# Patient Record
Sex: Male | Born: 1980 | Race: White | Hispanic: No | Marital: Married | Smoking: Never smoker
Health system: Southern US, Community
[De-identification: ages and names within clinical notes are randomized; demographics above are authoritative.]

## PROBLEM LIST (undated history)

## (undated) DIAGNOSIS — F988 Other specified behavioral and emotional disorders with onset usually occurring in childhood and adolescence: Secondary | ICD-10-CM

## (undated) DIAGNOSIS — M51369 Other intervertebral disc degeneration, lumbar region without mention of lumbar back pain or lower extremity pain: Secondary | ICD-10-CM

## (undated) DIAGNOSIS — M549 Dorsalgia, unspecified: Secondary | ICD-10-CM

## (undated) DIAGNOSIS — G4730 Sleep apnea, unspecified: Secondary | ICD-10-CM

## (undated) DIAGNOSIS — M7052 Other bursitis of knee, left knee: Secondary | ICD-10-CM

## (undated) DIAGNOSIS — F329 Major depressive disorder, single episode, unspecified: Secondary | ICD-10-CM

## (undated) DIAGNOSIS — F32A Depression, unspecified: Secondary | ICD-10-CM

## (undated) DIAGNOSIS — H0259 Other disorders affecting eyelid function: Secondary | ICD-10-CM

## (undated) DIAGNOSIS — E669 Obesity, unspecified: Secondary | ICD-10-CM

## (undated) DIAGNOSIS — F419 Anxiety disorder, unspecified: Secondary | ICD-10-CM

## (undated) DIAGNOSIS — R0602 Shortness of breath: Secondary | ICD-10-CM

## (undated) DIAGNOSIS — K760 Fatty (change of) liver, not elsewhere classified: Secondary | ICD-10-CM

## (undated) DIAGNOSIS — F1010 Alcohol abuse, uncomplicated: Secondary | ICD-10-CM

## (undated) DIAGNOSIS — M7051 Other bursitis of knee, right knee: Secondary | ICD-10-CM

## (undated) DIAGNOSIS — K219 Gastro-esophageal reflux disease without esophagitis: Secondary | ICD-10-CM

## (undated) DIAGNOSIS — I10 Essential (primary) hypertension: Secondary | ICD-10-CM

## (undated) DIAGNOSIS — E785 Hyperlipidemia, unspecified: Secondary | ICD-10-CM

## (undated) DIAGNOSIS — M5136 Other intervertebral disc degeneration, lumbar region: Secondary | ICD-10-CM

## (undated) DIAGNOSIS — M722 Plantar fascial fibromatosis: Secondary | ICD-10-CM

## (undated) DIAGNOSIS — F1990 Other psychoactive substance use, unspecified, uncomplicated: Secondary | ICD-10-CM

## (undated) HISTORY — DX: Depression, unspecified: F32.A

## (undated) HISTORY — DX: Obesity, unspecified: E66.9

## (undated) HISTORY — DX: Other bursitis of knee, left knee: M70.51

## (undated) HISTORY — DX: Essential (primary) hypertension: I10

## (undated) HISTORY — DX: Shortness of breath: R06.02

## (undated) HISTORY — DX: Dorsalgia, unspecified: M54.9

## (undated) HISTORY — DX: Sleep apnea, unspecified: G47.30

## (undated) HISTORY — DX: Other bursitis of knee, left knee: M70.52

## (undated) HISTORY — DX: Anxiety disorder, unspecified: F41.9

## (undated) HISTORY — DX: Other intervertebral disc degeneration, lumbar region without mention of lumbar back pain or lower extremity pain: M51.369

## (undated) HISTORY — DX: Hyperlipidemia, unspecified: E78.5

## (undated) HISTORY — DX: Plantar fascial fibromatosis: M72.2

## (undated) HISTORY — DX: Other specified behavioral and emotional disorders with onset usually occurring in childhood and adolescence: F98.8

## (undated) HISTORY — DX: Alcohol abuse, uncomplicated: F10.10

## (undated) HISTORY — DX: Other psychoactive substance use, unspecified, uncomplicated: F19.90

## (undated) HISTORY — DX: Fatty (change of) liver, not elsewhere classified: K76.0

## (undated) HISTORY — DX: Other intervertebral disc degeneration, lumbar region: M51.36

## (undated) HISTORY — DX: Other disorders affecting eyelid function: H02.59

## (undated) HISTORY — DX: Gastro-esophageal reflux disease without esophagitis: K21.9

---
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
## 2005-02-26 ENCOUNTER — Ambulatory Visit: Payer: Self-pay | Admitting: Family Medicine

## 2009-09-01 ENCOUNTER — Telehealth: Payer: Self-pay | Admitting: Family Medicine

## 2010-07-14 NOTE — Progress Notes (Signed)
Summary: call a nurse   Phone Note Call from Patient   Caller: Patient Summary of Call: Triage Record Num: [id number] Operator: Freddie Breech Patient Name: Alejandro [name] Call Date & Time: 08/30/2009 12:52:59PM Patient Phone: [phone] PCP: Audrie Gallus. Marrisa Kimber Patient Gender: Male PCP Fax : Patient DOB: [birth year] Practice Name: [facility] Reason for Call: C/o upper back and nek pain onset 08/25/09. UC eval on 08/26/09, xrays neg. Rx [employer]. Still having mild L shoulder pain but pain has decreased as he has moved about today. Homecare advised per shoulder non injury protocol. Protocol(s) Used: Shoulder Non-Injury Recommended Outcome per Protocol: Provide Home/Self Care Reason for Outcome: All other situations Care Advice: Apply a warm or cool compress, or a cloth-covered ice pack, whichever feels better, to the area for 20 minutes every 2-3 hours while awake to relieve discomfort.  ~  ~ Support part in position of comfort to reduce pain; avoid unnecessary movement.  ~ See provider if symptoms continue for 7 days with home care. Consider acetaminophen as directed on label or by pharmacist/provider for pain or fever PRECAUTIONS: - If there is no history of liver disease, alcoholism, or intake of three or more alcohol drinks per day - If approved by provider during pregnancy or when breastfeeding - During pregnancy, acetaminophen should not be taken more than 3 consecutive days without telling provider - Do not exceed recommended dose or frequency  ~  ~ Avoid activity that causes or worsens symptoms.  ~ SYMPTOM / CONDITION MANAGEMENT 03/ Initial call taken by: Melody Comas,  September 01, 2009 9:27 AM

## 2010-08-13 HISTORY — PX: US ECHOCARDIOGRAPHY: HXRAD669

## 2010-08-28 ENCOUNTER — Encounter: Payer: Self-pay | Admitting: Family Medicine

## 2010-08-28 ENCOUNTER — Telehealth (INDEPENDENT_AMBULATORY_CARE_PROVIDER_SITE_OTHER): Payer: Self-pay | Admitting: *Deleted

## 2010-08-28 LAB — CONVERTED CEMR LAB
Cholesterol: 283 mg/dL
Glucose, Bld: 97 mg/dL
HDL: 49 mg/dL
Triglycerides: 207 mg/dL

## 2010-08-31 ENCOUNTER — Ambulatory Visit (INDEPENDENT_AMBULATORY_CARE_PROVIDER_SITE_OTHER): Payer: BC Managed Care – PPO | Admitting: Family Medicine

## 2010-08-31 ENCOUNTER — Encounter: Payer: Self-pay | Admitting: Family Medicine

## 2010-08-31 ENCOUNTER — Other Ambulatory Visit: Payer: Self-pay | Admitting: Family Medicine

## 2010-08-31 DIAGNOSIS — Z8249 Family history of ischemic heart disease and other diseases of the circulatory system: Secondary | ICD-10-CM

## 2010-08-31 DIAGNOSIS — I10 Essential (primary) hypertension: Secondary | ICD-10-CM | POA: Insufficient documentation

## 2010-08-31 DIAGNOSIS — R079 Chest pain, unspecified: Secondary | ICD-10-CM

## 2010-08-31 DIAGNOSIS — E785 Hyperlipidemia, unspecified: Secondary | ICD-10-CM

## 2010-08-31 LAB — HEPATIC FUNCTION PANEL
Albumin: 4.8 g/dL (ref 3.5–5.2)
Alkaline Phosphatase: 61 U/L (ref 39–117)
Bilirubin, Direct: 0.1 mg/dL (ref 0.0–0.3)
Total Protein: 7.6 g/dL (ref 6.0–8.3)

## 2010-08-31 LAB — TSH: TSH: 2.23 u[IU]/mL (ref 0.35–5.50)

## 2010-08-31 LAB — BASIC METABOLIC PANEL
Calcium: 9.5 mg/dL (ref 8.4–10.5)
GFR: 89.38 mL/min (ref >60.00)
Potassium: 4.5 mEq/L (ref 3.5–5.1)
Sodium: 134 mEq/L — ABNORMAL LOW (ref 135–145)

## 2010-09-01 NOTE — Progress Notes (Signed)
Summary: High Cholestrol  Phone Note Call from Patient   Summary of Call: Pts father called says pt has his c check and it was 243. Pt is concerned and was told he needed to see his  PCP on next week.  Told father to fax over the labs and I would have one of the physicians to review them. Pts call back # work# [phone] Cell # [phone].Marland KitchenDaine Gip  August 28, 2010 2:12 PM  Initial call taken by: Daine Gip,  August 28, 2010 2:12 PM  Follow-up for Phone Call        Dr. Milinda Antis out of the office until Tuesday. Sp w/ Dr. Sharen Hones, agreed to see pt.Daine Gip  August 28, 2010 2:33 PM  Follow-up by: Daine Gip,  August 28, 2010 2:33 PM

## 2010-09-02 ENCOUNTER — Other Ambulatory Visit: Payer: Self-pay | Admitting: Family Medicine

## 2010-09-02 DIAGNOSIS — E785 Hyperlipidemia, unspecified: Secondary | ICD-10-CM

## 2010-09-02 MED ORDER — SIMVASTATIN 40 MG PO TABS: 40.0000 mg | ORAL_TABLET | Freq: Every day | ORAL | Status: DC

## 2010-09-02 NOTE — Telephone Encounter (Signed)
Ok to change.  Start simvastatin 40mg  daily, but may not reach goal with this medicine.   May need more potent statin in future.

## 2010-09-02 NOTE — Telephone Encounter (Signed)
Message left for patient to advise if he wants to change medication.

## 2010-09-03 ENCOUNTER — Encounter: Payer: Self-pay | Admitting: Cardiology

## 2010-09-03 NOTE — Telephone Encounter (Signed)
Rx was called into pharmacy. Still waiting for patient to call back to advise.

## 2010-09-10 NOTE — Assessment & Plan Note (Signed)
Summary: CHLOE AND BP UP   Vital Signs:  Patient profile:   30 year old male Height:      69 inches Weight:      215.50 pounds BMI:     31.94 Temp:     98.0 degrees F oral Pulse rate:   76 / minute Pulse rhythm:   regular BP sitting:   140 / 110  (left arm) Cuff size:   large  Vitals Entered By: Selena Batten Dance CMA Duncan Dull) (August 31, 2010 12:15 PM)  Serial Vital Signs/Assessments:  Time      Position  BP       Pulse  Resp  Temp     By                     130/100                        Eustaquio Boyden  MD  CC: BP and cholesterol are elevated (per biometric screening at work)   History of Present Illness: CC: re establish  recently married.  hadn't been to doctor in 6 years.  has noticed recent weight gain, more out of shape over last 3-4 years.  biometric screening at work - told high BP (156/108), chol TC 283, trig 207, HDL 49, LDL 192.  glu 97.  plantar fasciitis - getting treatment for this currently.  Dr. Fredonia Highland [place].  HTN - after college bp was up.  then lost weight and came down.  recently coming up..  very hereditary.  grandfather with MI age 67yo.  no HA, vision changes, SOB, urinary changes, LE swelling.  off meds.  HLD - father with HLD.    chest pain - does describe intermittent episodes associated with stress of left sided chest tightness/achiness relieved by rest.  lasts sec-min.  no n/v.  mild diaphoresis.  but did spinning class last week, doesn't get chest pain with aerobic exercise.  tries to watch diet.  working out 2 x/wk, gym.  (metabolic effects like P90X)  does yoga too, finds it relaxes him.  new job - very stressful (and pt type A personality) but enjoys new job.  doing great at job.  Conservation officer, nature (Diplomatic Services operational officer)  not fasting today.  reviewed old chart, updated.  -  Date:  11/10/1995    TD booster Td  Current Medications (verified): 1)  None  Allergies (verified): No Known Drug Allergies  Past History:  Past Medical  History: HTN HLD BMI 31  Past Surgical History: none  Family History: F: HLD, CAD/MI (32yo) M: touch of DM MGF: HTN, CAD/MI (32yo), bypass (32yo)  No CA, CVA  Social History: Never smoking, occasional EtOH, no rec drugs caffeine: 3 cups/day coffee. 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         heartburn occasionally no heat/cold intolerance, skin or hair changes, diarrhea/constipation, n/v rest of 10 pt ROS negative.  Physical Exam  General:  Well-developed,well-nourished,in no acute distress; alert,appropriate and cooperative throughout examination Head:  Normocephalic and atraumatic without obvious abnormalities. No apparent alopecia or balding. Eyes:  No corneal or conjunctival inflammation noted. EOMI. Perrla.  some exophthalmos,  unsure baseline Ears:  TMs clear bilaterally Nose:  nares clear bialterally Mouth:  mmm, no pharyngeal erythema/exudates Neck:  thyroid fullness ?goiter, no LAD Lungs:  Normal respiratory effort, chest expands symmetrically. Lungs are clear to auscultation, no crackles or wheezes. Heart:  Normal rate and regular rhythm. S1 and S2 normal without gallop, murmur, click, rub or other extra sounds. Abdomen:  Bowel sounds positive,abdomen soft and non-tender without masses, organomegaly or hernias noted. Msk:  No deformity or scoliosis noted of thoracic or lumbar spine.   Pulses:  2+ rad pulses, brisk cap refill Extremities:  no pedal edema Neurologic:  CN grossly intact, station and gait intact Skin:  Intact without  suspicious lesions or rashes Psych:  full affect, pleasant and cooperative with exam   Impression & Recommendations:  Problem # 1:  HYPERTENSION, ESSENTIAL (ICD-401.9) new dx.  discussed implications and goals.  rec more potassium/water, less sodium.  Start hctz.  return 2wk nurse visit, 1 mo f/u. blood work today, check TSH.  check baseline EKG - see #4.  His updated medication list for this problem includes:    Hydrochlorothiazide 25 Mg Tabs (Hydrochlorothiazide) .[name]... Take 1/2 pill daily  Orders: Venipuncture (82956) TLB-BMP (Basic Metabolic Panel-BMET) (80048-METABOL) TLB-TSH (Thyroid Stimulating Hormone) (84443-TSH)  BP today: 140/110 Prior BP: 156/108 (08/28/2010)  Labs Reviewed: Chol: 283 (08/28/2010)   HDL: 49 (08/28/2010)   LDL: 192 (08/28/2010)   TG: 207 (08/28/2010)  Problem # 2:  DYSLIPIDEMIA (ICD-272.4) recheck dLDL today (nonfasting).  at first wanted goal to be <130, but given strong fmhx may need more aggressive goal.  await cards recs.   discussed TLC vs statin therapy.  pt opts for statin therapy.  discussed common side effects of statins, check baseline labs.  His updated medication list for this problem includes:    Atorvastatin Calcium 20 Mg Tabs (Atorvastatin calcium) .[name]... Take one daily for cholesterol  Orders: TLB-TSH (Thyroid Stimulating Hormone) (84443-TSH) TLB-Hepatic/Liver Function Pnl (80076-HEPATIC) TLB-Cholesterol, Direct LDL (83721-DIRLDL)    HDL:49 (08/28/2010)  LDL:192 (08/28/2010)  Chol:283 (08/28/2010)  Trig:207 (08/28/2010)  Problem # 3:  CORONARY ARTERY DISEASE, PREMATURE, FAMILY HX (ICD-V17.3) strong family predisposition   Orders: EKG w/ Interpretation (93000)  Problem # 4:  CHEST PAIN UNSPECIFIED (ICD-786.50) EKG - NSR 65, p mitrale?, if >35yo would meet voltage criteria for LVH, ST depression and t inversion lateral leads.  no old to compare.  given chest pain and fm hx, referral to cards for further evaluation.  EKG with some  changes.  given this along with description of chest pain, will refer to cards.  initially attributed more to anxiety especially given spin class without chest pain and stays active at gym 2x/wk, but given fmhx, HTN, would prefer cards eval.  start baby ASA.  Orders: Cardiology Referral (Cardiology) EKG w/ Interpretation (93000)  Complete Medication List: 1)  Aspirin 81 Mg Tbec (Aspirin) .... Take one daily 2)  Hydrochlorothiazide 25 Mg Tabs (Hydrochlorothiazide) .... Take 1/2 pill daily 3)  Atorvastatin Calcium 20 Mg Tabs (Atorvastatin calcium) .... Take one daily for cholesterol  Patient Instructions: 1)  return in 2 weeks for nurse visit for blood pressure check. 2)  return in 1 month for follow up with me. 3)  start hydrochlorothiazide 1/2 pill daily. 4)  start atorvastatin 20mg  daily for cholesterol. 5)  watch for muscle aches , if that happens let us know. or if any problems with meds. 6)  blood work today. 7)  more potassium rich foods, more water, less sodium/salt (  for blood pressure).   8)  EKG not completely normal.  start baby aspirin daily (81mg  enteric coated). 9)  Pass by [facility]'s office for referral to Dr. Daleen Squibb. Prescriptions: ATORVASTATIN CALCIUM 20 MG TABS (ATORVASTATIN CALCIUM) take one daily for cholesterol  #30 x 1   Entered and Authorized by:   Eustaquio Boyden  MD   Signed by:   Eustaquio Boyden  MD on 08/31/2010   Method used:   Electronically to        [facility] (retail)       [address]       [place], [place]  [id number]       Ph: [phone]       Fax: [phone]   RxID:   [id number] HYDROCHLOROTHIAZIDE 25 MG TABS (HYDROCHLOROTHIAZIDE) take 1/2 pill daily  #30 x 1   Entered and Authorized by:   Eustaquio Boyden  MD   Signed by:   Eustaquio Boyden  MD on 08/31/2010   Method used:   Electronically to        [facility] (retail)       [address]       [place], [place]  [id number]        Ph: [phone]       Fax: [phone]   RxID:   [id number]    Orders Added: 1)  Cardiology Referral [Cardiology] 2)  Venipuncture [76160] 3)  TLB-BMP (Basic Metabolic Panel-BMET) [80048-METABOL] 4)  TLB-TSH (Thyroid Stimulating Hormone) [84443-TSH] 5)  TLB-Hepatic/Liver Function Pnl [80076-HEPATIC] 6)  TLB-Cholesterol, Direct LDL [83721-DIRLDL] 7)  New Patient Level IV [99204] 8)  EKG w/ Interpretation [93000]    Prior Medications: Current Allergies (reviewed today): No known allergies   Appended Document: CHLOE AND BP UP framingham risk 1%

## 2010-09-11 ENCOUNTER — Encounter: Payer: Self-pay | Admitting: Cardiology

## 2010-09-11 ENCOUNTER — Ambulatory Visit (INDEPENDENT_AMBULATORY_CARE_PROVIDER_SITE_OTHER): Payer: BC Managed Care – PPO | Admitting: Cardiology

## 2010-09-11 VITALS — BP 118/98 | HR 76 | Resp 16 | Ht 69.0 in | Wt 217.8 lb

## 2010-09-11 DIAGNOSIS — E7849 Other hyperlipidemia: Secondary | ICD-10-CM | POA: Insufficient documentation

## 2010-09-11 DIAGNOSIS — E782 Mixed hyperlipidemia: Secondary | ICD-10-CM | POA: Insufficient documentation

## 2010-09-11 DIAGNOSIS — R9431 Abnormal electrocardiogram [ECG] [EKG]: Secondary | ICD-10-CM

## 2010-09-11 DIAGNOSIS — I10 Essential (primary) hypertension: Secondary | ICD-10-CM

## 2010-09-11 NOTE — Assessment & Plan Note (Signed)
Weight loss, exercise and atorvastatin nightly reviewed. Follow up blood work in 6 weeks.

## 2010-09-11 NOTE — Assessment & Plan Note (Signed)
Goal 130/80. TLC emphasized and take meds.

## 2010-09-11 NOTE — Progress Notes (Signed)
   Patient ID: Alejandro [name], male    DOB: May 17, 1981, 30 y.o.   MRN: [id number]  HPI Alejandro [name] is a delightful 30 yo MWM who is referred for new onset HTN and HL. This was picked up at a biometric screen at work. Subsequently seen by Dr Patrice Paradise and begun on HCTZ and Atorvastatin. Baseline LDL was 232.7, HDL 49. He is working long hrs and not exercising. He just started back but Cardio limited by plantar fascitis. There is FH of CAD. He is overweight.  His EKG is consistent with HOCM or simply LVH.  Review of Systems  [all other systems reviewed and are negative      Physical Exam  Constitutional: He is oriented to person, place, and time. He appears well-developed and well-nourished. No distress.  HENT:  Head: Normocephalic and atraumatic.  Eyes: EOM are normal.  Neck: Neck supple. No JVD present. No tracheal deviation present. No thyromegaly present.  Cardiovascular: Normal rate, regular rhythm and intact distal pulses.  Exam reveals gallop.   No murmur heard.      Positive S4  Pulmonary/Chest: Effort normal and breath sounds normal. He has no rales.  Abdominal: Bowel sounds are normal.  Musculoskeletal: Normal range of motion. He exhibits no edema.  Neurological: He is alert and oriented to person, place, and time.  Skin: Skin is warm and dry.  Psychiatric: He has a normal mood and affect.

## 2010-09-11 NOTE — Assessment & Plan Note (Signed)
Will obtain Echo to evaluate for LVH vs HOCM.

## 2010-09-11 NOTE — Patient Instructions (Addendum)
1. Your physician recommends that you schedule a follow-up appointment in: as needed with Dr. Daleen Squibb 2. Your physician has requested that you have an echocardiogram. Echocardiography is a painless test that uses sound waves to create images of your heart. It provides your doctor with information about the size and shape of your heart and how well your heart's chambers and valves are working. This procedure takes approximately one hour. There are no restrictions for this procedure. 3. Your physician encouraged you to lose weight for better health.  A weight loss of 20 pounds or 4 pounds / month. 4. Your physician discussed the importance of regular exercise and recommended that you start or continue a regular exercise program for good health. Exercise 30 minutes a day up to 3 hours per week. 5. Your physician has requested that you regularly monitor and record your blood pressure readings at home. Please use the same machine at the same time of day to check your readings and record them to bring to your follow-up visit. A goal bp of less than or equal to 130/80

## 2010-09-14 ENCOUNTER — Inpatient Hospital Stay (INDEPENDENT_AMBULATORY_CARE_PROVIDER_SITE_OTHER)
Admission: RE | Admit: 2010-09-14 | Discharge: 2010-09-14 | Disposition: A | Discharge status: Home / Self Care | Payer: BC Managed Care – PPO | Source: Ambulatory Visit | Attending: Family Medicine | Admitting: Family Medicine

## 2010-09-14 DIAGNOSIS — S61209A Unspecified open wound of unspecified finger without damage to nail, initial encounter: Secondary | ICD-10-CM

## 2010-09-22 ENCOUNTER — Other Ambulatory Visit: Payer: Self-pay | Admitting: *Deleted

## 2010-09-22 ENCOUNTER — Ambulatory Visit (HOSPITAL_COMMUNITY): Payer: BC Managed Care – PPO | Attending: Cardiology | Admitting: Radiology

## 2010-09-22 DIAGNOSIS — R9431 Abnormal electrocardiogram [ECG] [EKG]: Secondary | ICD-10-CM

## 2010-09-24 ENCOUNTER — Telehealth: Payer: Self-pay | Admitting: *Deleted

## 2010-09-24 NOTE — Telephone Encounter (Signed)
LMOVM with echo results. Wife, Maralyn Sago, is also aware. Mylo Red RN

## 2010-09-24 NOTE — Telephone Encounter (Signed)
Message copied by Lisabeth Devoid on Thu Sep 24, 2010  5:21 PM ------      Message from: Valera Castle      Created: Thu Sep 24, 2010  1:28 PM       Normal, reassurance.

## 2011-02-02 ENCOUNTER — Ambulatory Visit (INDEPENDENT_AMBULATORY_CARE_PROVIDER_SITE_OTHER): Payer: BC Managed Care – PPO | Admitting: Family Medicine

## 2011-02-02 ENCOUNTER — Encounter: Payer: Self-pay | Admitting: Family Medicine

## 2011-02-02 DIAGNOSIS — E782 Mixed hyperlipidemia: Secondary | ICD-10-CM

## 2011-02-02 DIAGNOSIS — E669 Obesity, unspecified: Secondary | ICD-10-CM | POA: Insufficient documentation

## 2011-02-02 DIAGNOSIS — I10 Essential (primary) hypertension: Secondary | ICD-10-CM

## 2011-02-02 MED ORDER — HYDROCHLOROTHIAZIDE 25 MG PO TABS: 12.5000 mg | ORAL_TABLET | Freq: Every day | ORAL | Status: DC

## 2011-02-02 MED ORDER — SIMVASTATIN 40 MG PO TABS: 40.0000 mg | ORAL_TABLET | Freq: Every day | ORAL | Status: DC

## 2011-02-02 NOTE — Assessment & Plan Note (Signed)
Great control on minimal HCTZ. discussed may be able to come off med if weight loss. Continue for now.

## 2011-02-02 NOTE — Assessment & Plan Note (Signed)
Discussed. Pt to see nutritionist tomorrow at work.

## 2011-02-02 NOTE — Assessment & Plan Note (Addendum)
reviewed blood work in detail with pt. Return fasting for recheck FLP. Compliant with zocor. Discussed effect of even 5% weight loss. Low chol diet provided today.

## 2011-02-02 NOTE — Patient Instructions (Signed)
Return at your convenience fasting for blood work - will recheck cholesterol levels. Low cholesterol diet provided. Increase soy, nuts, beans, lentils, legumes can help lower bad cholesterol. Blood pressure is looking good today!

## 2011-02-02 NOTE — Progress Notes (Signed)
  Subjective:    Patient ID: Alejandro [name], male    DOB: 1980/08/09, 30 y.o.   MRN: [id number]  HPI CC: 3 mo f/u   Seeing PT for plantar fasciitis - steroid pulse therapy.  Going to gym in mornings (6am).  Has 1-1 with nutritionist tomorrow at work.  Wife pregnant.  Trying to avoid salt, getting good fruits and vegetables.  Difficulty making time to exercise but knows needs to.  Goal is to run 2x/wk, currently unable 2/2 feet.  1. HTN - no lows, no dizziness, No HA, vision changes, CP/tightness, SOB, leg swelling.  Compliant with HCTZ, tolerating well. BP Readings from Last 3 Encounters:  02/02/11 118/82  09/11/10 118/98  08/31/10 140/110   Wt Readings from Last 3 Encounters:  02/02/11 219 lb 12 oz (99.678 kg)  09/11/10 217 lb 12.8 oz (98.793 kg)  08/31/10 215 lb 8 oz (97.75 kg)   Chest pain/abnl EKG - saw cardiologist, s/p echo WNL.  No further w/u.  Note and echo reviewed.  Review of Systems per HPI   Objective:   Physical Exam  Nursing note and vitals reviewed. Constitutional: He appears well-developed and well-nourished. No distress.  HENT:  Head: Normocephalic and atraumatic.  Mouth/Throat: Oropharynx is clear and moist. No oropharyngeal exudate.  Eyes: Conjunctivae and EOM are normal. Pupils are equal, round, and reactive to light. No scleral icterus.  Neck: Normal range of motion. Neck supple. Carotid bruit is not present.  Cardiovascular: Normal rate, regular rhythm, normal heart sounds and intact distal pulses.   No murmur heard. Pulmonary/Chest: Breath sounds normal. No respiratory distress. He has no wheezes. He has no rales.  Abdominal: There is no tenderness.  Skin: Skin is warm and dry. No rash noted.          Assessment & Plan:

## 2011-08-19 ENCOUNTER — Encounter: Payer: Self-pay | Admitting: Family Medicine

## 2011-08-20 ENCOUNTER — Encounter: Payer: Self-pay | Admitting: Family Medicine

## 2011-08-20 ENCOUNTER — Other Ambulatory Visit: Payer: Self-pay | Admitting: *Deleted

## 2011-08-20 ENCOUNTER — Ambulatory Visit (INDEPENDENT_AMBULATORY_CARE_PROVIDER_SITE_OTHER): Payer: BC Managed Care – PPO | Admitting: Family Medicine

## 2011-08-20 VITALS — BP 120/72 | HR 64 | Temp 97.9°F | Ht 68.0 in | Wt 220.0 lb

## 2011-08-20 DIAGNOSIS — N50812 Left testicular pain: Secondary | ICD-10-CM | POA: Insufficient documentation

## 2011-08-20 DIAGNOSIS — B999 Unspecified infectious disease: Secondary | ICD-10-CM

## 2011-08-20 DIAGNOSIS — N50819 Testicular pain, unspecified: Secondary | ICD-10-CM

## 2011-08-20 DIAGNOSIS — N509 Disorder of male genital organs, unspecified: Secondary | ICD-10-CM

## 2011-08-20 LAB — POCT URINALYSIS DIPSTICK
Bilirubin, UA: NEGATIVE
Blood, UA: NEGATIVE
Nitrite, UA: NEGATIVE
Spec Grav, UA: 1.02
Urobilinogen, UA: NEGATIVE
pH, UA: 5

## 2011-08-20 MED ORDER — CIPROFLOXACIN HCL 500 MG PO TABS: 500.0000 mg | ORAL_TABLET | Freq: Two times a day (BID) | ORAL | Status: AC

## 2011-08-20 MED ORDER — NAPROXEN 500 MG PO TABS: ORAL_TABLET | ORAL | Status: AC

## 2011-08-20 NOTE — Progress Notes (Signed)
Addended by: Eliezer Bottom on: 08/20/2011 05:12 PM   Modules accepted: Orders

## 2011-08-20 NOTE — Progress Notes (Signed)
  Subjective:    Patient ID: Alejandro [name], male    DOB: 1980-08-06, 31 y.o.   MRN: [id number]  HPI CC: testicle soreness  Bilateral L>R testicle soreness.  This has been going on for 1 month.  Started after wearing tight pants at work.  Worse when sitting long periods of time.  No new lumps that pt has noticed  No fevers/chills, nausea, dysuria, discharge.  Feels like bruise.  No new sex contacts.  No problems with anything like this in past.  So far has tried nothing for this.  Review of Systems Per HPI    Objective:   Physical Exam  Nursing note and vitals reviewed. Constitutional: He appears well-developed and well-nourished. No distress.  Abdominal: Hernia confirmed negative in the right inguinal area and confirmed negative in the left inguinal area.  Genitourinary: Penis normal. Right testis shows no mass, no swelling and no tenderness. Right testis is descended. Left testis shows tenderness. Left testis shows no mass and no swelling. Left testis is descended. No phimosis or penile tenderness.       L testicle and epididymis tender to palpation No testicular lump, irregularity. No hydrocele/spermatocele present.  Lymphadenopathy:       Right: No inguinal adenopathy present.       Left: No inguinal adenopathy present.       Assessment & Plan:

## 2011-08-20 NOTE — Patient Instructions (Addendum)
Good to see you today, call us with questions. I think you may have congestive epididymitis.  Will cover with course of antibiotic twice daily for 10 days. Treat with anti inflammatory (twice daily with food then as needed).  Also elevate scrotum/lay down when prolonged sitting. Call us if not better in 2 weeks with treatment for ultrasound.  Epididymitis Epididymitis is a swelling (inflammation) of the epididymis. The epididymis is a cord-like structure along the back part of the testicle. Epididymitis is usually, but not always, caused by infection. This is usually a sudden problem beginning with chills, fever and pain behind the scrotum and in the testicle. There may be swelling and redness of the testicle. DIAGNOSIS  Physical examination will reveal a tender, swollen epididymis. Sometimes, cultures are obtained from the urine or from prostate secretions to help find out if there is an infection or if the cause is a different problem. Sometimes, blood work is performed to see if your white blood cell count is elevated and if a germ (bacterial) or viral infection is present. Using this knowledge, an appropriate medicine which kills germs (antibiotic) can be chosen by your caregiver. A viral infection causing epididymitis will most often go away (resolve) without treatment. HOME CARE INSTRUCTIONS   Hot sitz baths for 20 minutes, 4 times per day, may help relieve pain.   Only take over-the-counter or prescription medicines for pain, discomfort or fever as directed by your caregiver.   Take all medicines, including antibiotics, as directed. Take the antibiotics for the full prescribed length of time even if you are feeling better.   It is very important to keep all follow-up appointments.  SEEK IMMEDIATE MEDICAL CARE IF:   You have a fever.   You have pain not relieved with medicines.   You have any worsening of your problems.   Your pain seems to come and go.   You develop pain, redness,  and swelling in the scrotum and surrounding areas.  MAKE SURE YOU:   Understand these instructions.   Will watch your condition.   Will get help right away if you are not doing well or get worse.  Document Released: 05/28/2000 Document Revised: 05/20/2011 Document Reviewed: 04/17/2009 [facility] Patient Information 2012 [place], [place].

## 2011-08-20 NOTE — Assessment & Plan Note (Signed)
Anticipate noninfectious left epididymitis/orchitis, onset from too tight pants. Cover infectious cause with cipro 500mg  bid x 10 days. O/w treat with naprosyn twice daily for 1 wk then as needed. Discussed anticipate course of illness. If not improved in 2-3 wks, update [place] for testicular ultrasound.

## 2011-08-22 LAB — URINE CULTURE: Organism ID, Bacteria: NO GROWTH

## 2011-10-11 ENCOUNTER — Encounter: Payer: Self-pay | Admitting: Family Medicine

## 2011-10-11 ENCOUNTER — Ambulatory Visit (INDEPENDENT_AMBULATORY_CARE_PROVIDER_SITE_OTHER): Payer: BC Managed Care – PPO | Admitting: Family Medicine

## 2011-10-11 VITALS — BP 110/80 | HR 80 | Temp 98.3°F | Wt 214.0 lb

## 2011-10-11 DIAGNOSIS — N50819 Testicular pain, unspecified: Secondary | ICD-10-CM

## 2011-10-11 DIAGNOSIS — A084 Viral intestinal infection, unspecified: Secondary | ICD-10-CM | POA: Insufficient documentation

## 2011-10-11 DIAGNOSIS — N509 Disorder of male genital organs, unspecified: Secondary | ICD-10-CM

## 2011-10-11 DIAGNOSIS — A09 Infectious gastroenteritis and colitis, unspecified: Secondary | ICD-10-CM

## 2011-10-11 MED ORDER — ONDANSETRON HCL 4 MG PO TABS: 4.0000 mg | ORAL_TABLET | Freq: Three times a day (TID) | ORAL | Status: AC | PRN

## 2011-10-11 NOTE — Patient Instructions (Signed)
Pass by Alejandro [name]'s office for referral to get testicular ultrasound to check on continued soreness. I think you have viral gastroenteritis - push fluids and rest.  Update [place] if not improving as expected. zofran for nausea as needed.  Viral Gastroenteritis Viral gastroenteritis is also known as stomach flu. This condition affects the stomach and intestinal tract. It can cause sudden diarrhea and vomiting. The illness typically lasts 3 to 8 days. Most people develop an immune response that eventually gets rid of the virus. While this natural response develops, the virus can make you quite ill. CAUSES  Many different viruses can cause gastroenteritis, such as rotavirus or noroviruses. You can catch one of these viruses by consuming contaminated food or water. You may also catch a virus by sharing utensils or other personal items with an infected person or by touching a contaminated surface. SYMPTOMS  The most common symptoms are diarrhea and vomiting. These problems can cause a severe loss of body fluids (dehydration) and a body salt (electrolyte) imbalance. Other symptoms may include:  Fever.   Headache.   Fatigue.   Abdominal pain.  DIAGNOSIS  Your caregiver can usually diagnose viral gastroenteritis based on your symptoms and a physical exam. A stool sample may also be taken to test for the presence of viruses or other infections. TREATMENT  This illness typically goes away on its own. Treatments are aimed at rehydration. The most serious cases of viral gastroenteritis involve vomiting so severely that you are not able to keep fluids down. In these cases, fluids must be given through an intravenous line (IV). HOME CARE INSTRUCTIONS   Drink enough fluids to keep your urine clear or pale yellow. Drink small amounts of fluids frequently and increase the amounts as tolerated.   Ask your caregiver for specific rehydration instructions.   Avoid:   Foods high in sugar.   Alcohol.    Carbonated drinks.   Tobacco.   Juice.   Caffeine drinks.   Extremely hot or cold fluids.   Fatty, greasy foods.   Too much intake of anything at one time.   Dairy products until 24 to 48 hours after diarrhea stops.   You may consume probiotics. Probiotics are active cultures of beneficial bacteria. They may lessen the amount and number of diarrheal stools in adults. Probiotics can be found in yogurt with active cultures and in supplements.   Wash your hands well to avoid spreading the virus.   Only take over-the-counter or prescription medicines for pain, discomfort, or fever as directed by your caregiver. Do not give aspirin to children. Antidiarrheal medicines are not recommended.   Ask your caregiver if you should continue to take your regular prescribed and over-the-counter medicines.   Keep all follow-up appointments as directed by your caregiver.  SEEK IMMEDIATE MEDICAL CARE IF:   You are unable to keep fluids down.   You do not urinate at least once every 6 to 8 hours.   You develop shortness of breath.   You notice blood in your stool or vomit. This may look like coffee grounds.   You have abdominal pain that increases or is concentrated in one small area (localized).   You have persistent vomiting or diarrhea.   You have a fever.   The patient is a child younger than 3 months, and he or she has a fever.   The patient is a child older than 3 months, and he or she has a fever and persistent symptoms.   The patient  is a child older than 3 months, and he or she has a fever and symptoms suddenly get worse.   The patient is a baby, and he or she has no tears when crying.  MAKE SURE YOU:   Understand these instructions.   Will watch your condition.   Will get help right away if you are not doing well or get worse.  Document Released: 05/31/2005 Document Revised: 05/20/2011 Document Reviewed: 03/17/2011 [facility] Patient Information 2012 [place], [place].

## 2011-10-11 NOTE — Assessment & Plan Note (Signed)
Given sxs going on past 1 mo, will obtain testicular US.

## 2011-10-11 NOTE — Assessment & Plan Note (Signed)
Supportive care discussed. zofran prn nausea. Small sips throughout the day. Avoid dairy products, slowly advance diet.

## 2011-10-11 NOTE — Progress Notes (Signed)
  Subjective:    Patient ID: Alejandro [name], male    DOB: 1980-11-25, 31 y.o.   MRN: [id number]  HPI CC: bad stomach bug  10/06/2011 started with watery diarrhea, out of work Thursday and Friday.  Continued through weekend, yesterday as well.  Nauseated.  No vomiting.  Felt like flu - chills, body aches, fatigue.  Today took immodium which helped diarrhea.  Nausea improved today.  Decreased appetite.  Today had greek yogurt and greek salad.  Trying to stay hydrated with water/gatorade, small sips throughout the day.  No blood in stool.  No mucous in stool.  No recent travel.  Uses city water. Wife also sick 1 wk ago.  She thought it was food poisoning after ate suspect chicken. Son not sick.  He does go to daycare.  Also - seen here last month with dx epididymitis presumed congestive with negative UCx, treated with 10d course cipro bid and naprosyn, improved but still residual dull ache present.  Wt Readings from Last 3 Encounters:  10/11/11 214 lb (97.07 kg)  08/20/11 220 lb (99.791 kg)  02/02/11 219 lb 12 oz (99.678 kg)  doing weight watchers  Review of Systems Per HPI    Objective:   Physical Exam  Nursing note and vitals reviewed. Constitutional: He appears well-developed and well-nourished. No distress.  HENT:  Head: Normocephalic and atraumatic.  Mouth/Throat: Oropharynx is clear and moist. No oropharyngeal exudate.  Neck: Normal range of motion. Neck supple.  Cardiovascular: Normal rate, regular rhythm, normal heart sounds and intact distal pulses.   No murmur heard. Pulmonary/Chest: Effort normal and breath sounds normal. No respiratory distress. He has no wheezes. He has no rales.  Abdominal: Soft. Bowel sounds are normal. He exhibits no distension and no mass. There is no hepatosplenomegaly. There is no tenderness. There is no rebound, no guarding and no CVA tenderness.  Lymphadenopathy:    He has no cervical adenopathy.       Assessment & Plan:

## 2011-10-12 ENCOUNTER — Other Ambulatory Visit: Payer: Self-pay | Admitting: Family Medicine

## 2011-10-12 DIAGNOSIS — N50819 Testicular pain, unspecified: Secondary | ICD-10-CM

## 2011-10-13 ENCOUNTER — Ambulatory Visit
Admission: RE | Admit: 2011-10-13 | Discharge: 2011-10-13 | Disposition: A | Discharge status: Home / Self Care | Payer: BC Managed Care – PPO | Source: Ambulatory Visit | Attending: Family Medicine | Admitting: Family Medicine

## 2011-10-13 DIAGNOSIS — N50819 Testicular pain, unspecified: Secondary | ICD-10-CM

## 2012-04-25 ENCOUNTER — Ambulatory Visit: Payer: BC Managed Care – PPO | Admitting: Family Medicine

## 2012-09-18 ENCOUNTER — Encounter: Payer: Self-pay | Admitting: Family Medicine

## 2012-09-18 ENCOUNTER — Ambulatory Visit (INDEPENDENT_AMBULATORY_CARE_PROVIDER_SITE_OTHER): Payer: BC Managed Care – PPO | Admitting: Family Medicine

## 2012-09-18 VITALS — BP 136/86 | HR 68 | Temp 98.1°F | Ht 68.75 in | Wt 224.0 lb

## 2012-09-18 DIAGNOSIS — Z0000 Encounter for general adult medical examination without abnormal findings: Secondary | ICD-10-CM

## 2012-09-18 DIAGNOSIS — E782 Mixed hyperlipidemia: Secondary | ICD-10-CM

## 2012-09-18 DIAGNOSIS — I10 Essential (primary) hypertension: Secondary | ICD-10-CM

## 2012-09-18 DIAGNOSIS — R0683 Snoring: Secondary | ICD-10-CM

## 2012-09-18 DIAGNOSIS — E669 Obesity, unspecified: Secondary | ICD-10-CM

## 2012-09-18 DIAGNOSIS — R0989 Other specified symptoms and signs involving the circulatory and respiratory systems: Secondary | ICD-10-CM

## 2012-09-18 DIAGNOSIS — R0609 Other forms of dyspnea: Secondary | ICD-10-CM

## 2012-09-18 DIAGNOSIS — Z0001 Encounter for general adult medical examination with abnormal findings: Secondary | ICD-10-CM | POA: Insufficient documentation

## 2012-09-18 MED ORDER — HYDROCHLOROTHIAZIDE 12.5 MG PO TABS: 12.5000 mg | ORAL_TABLET | Freq: Every day | ORAL | Status: DC

## 2012-09-18 MED ORDER — SIMVASTATIN 40 MG PO TABS: 40.0000 mg | ORAL_TABLET | Freq: Every day | ORAL | Status: DC

## 2012-09-18 NOTE — Patient Instructions (Addendum)
Restart hydrochlorothiazide and simvastatin. May use aspirin 3 times a week. Return in 3 months for fasting blood work and afterwards for office visit. Restart exercise regimen. Good to see you today, call us with questions. Pass by Marion's office for referral to nutritionist - check with insurance on copay for this.

## 2012-09-18 NOTE — Assessment & Plan Note (Signed)
Reviewed #s from biometric screen - too high for him. advised to restart simvastatin 40mg  daily. Recheck #s in 3 months. Encouraged weight loss. Goal LDL <100 given fmhx.

## 2012-09-18 NOTE — Assessment & Plan Note (Signed)
Diet/lifestyle change for next 3 months, will reassess next visit. ESI next visit. May need sleep study in future.

## 2012-09-18 NOTE — Assessment & Plan Note (Signed)
Elevated #s at work, restart hctz 12.5mg . Encouraged weight loss. Refer to nutritionist per pt request.

## 2012-09-18 NOTE — Assessment & Plan Note (Signed)
Discussed.  Pt interested in nutritionist referral to review custom meal plan for his family Placed referral today, I asked him to check on copay and insurance coverage.

## 2012-09-18 NOTE — Progress Notes (Signed)
Subjective:    Patient ID: Alejandro [name], male    DOB: 1981/06/07, 32 y.o.   MRN: [id number]  HPI CC: annual exam  HTN - off meds.  Prior on hctz 12.5mg  daily. HLD - off simvastatin.  Strong fmhx CAD. Obesity - interested in nutritionist.  Prior did weight watchers for men.  Pt does most of the cooking at his house. Sleep trouble - increased snoring noted.  Wife sleeps in another room.  Possible apneic episodes.  No PNdyspnea.  Thinks gets restful sleep.  Some daytime somnolence but doesn't fall asleep when reading book or watching tv.  Working out twice a week. Motivated to restart exercise. When runs, notices causes leg pains (knees, ankles, heels).  H/o plantar fasciitis. Busy with children at home.  Doing transcendental meditation twice a day.  Brings biometric screen with BP 146/107, HR 81, weight 224lbs, BMI 34, waist circumference 37.8, body fat 28.5%.  asked to scan TC 280, trig 278, HDL 47, LDL 178.  Glu 103.   Body mass index is 33.33 kg/(m^2).  Wt Readings from Last 3 Encounters:  09/18/12 224 lb (101.606 kg)  10/11/11 214 lb (97.07 kg)  08/20/11 220 lb (99.791 kg)   Preventative: Tetanus given 09/14/2010. Repeat today. Sunscreen use discussed. Seatbelt use discussed.  Medications and allergies reviewed and updated in chart.  Past histories reviewed and updated if relevant as below. Patient Active Problem List  Diagnosis  . HYPERTENSION, ESSENTIAL  . CHEST PAIN UNSPECIFIED  . Hyperlipidemia, mixed  . Abnormal EKG  . Obesity  . Testicle pain  . Healthcare maintenance   Past Medical History  Diagnosis Date  . Hypertension   . Hyperlipidemia   . Obesity   . Plantar fasciitis     followed by ortho   Past Surgical History  Procedure Laterality Date  . US echocardiography  08/2010    normal, no LVH   History  Substance Use Topics  . Smoking status: Never Smoker   . Smokeless tobacco: Not on file  . Alcohol Use: 0.0 oz/week     Comment: occasional    Family History  Problem Relation Age of Onset  . Hyperlipidemia Father   . Coronary artery disease Father   . Heart attack Father 87  . Diabetes Mother     touch of  . Hypertension Other     grandfather-mother side   . Coronary artery disease Other     MI 30's and bypass 60's. Grandfather mother side   No Known Allergies Current Outpatient Prescriptions on File Prior to Visit  Medication Sig Dispense Refill  . aspirin 81 MG tablet Take 81 mg by mouth daily.        . hydrochlorothiazide 25 MG tablet Take 0.5 tablets (12.5 mg total) by mouth daily.  45 tablet  3  . simvastatin (ZOCOR) 40 MG tablet Take 1 tablet (40 mg total) by mouth at bedtime.  90 tablet  3   No current facility-administered medications on file prior to visit.    Review of Systems  Constitutional: Negative for fever, chills, activity change, appetite change, fatigue and unexpected weight change.  HENT: Negative for hearing loss and neck pain.   Eyes: Negative for visual disturbance.  Respiratory: Negative for cough, chest tightness, shortness of breath and wheezing.   Cardiovascular: Negative for chest pain, palpitations and leg swelling.  Gastrointestinal: Negative for nausea, vomiting, abdominal pain, diarrhea, constipation, blood in stool and abdominal distention.  Genitourinary: Negative for hematuria and difficulty urinating.  Musculoskeletal: Negative for myalgias and arthralgias.  Skin: Negative for rash.  Neurological: Negative for dizziness, seizures, syncope and headaches.  Hematological: Does not bruise/bleed easily.  Psychiatric/Behavioral: Negative for dysphoric mood. The patient is not nervous/anxious.        Objective:   Physical Exam  Nursing note and vitals reviewed. Constitutional: He is oriented to person, place, and time. He appears well-developed and well-nourished. No distress.  HENT:  Head: Normocephalic and atraumatic.  Right Ear: Hearing, tympanic membrane, external ear and ear  canal normal.  Left Ear: Hearing, tympanic membrane, external ear and ear canal normal.  Nose: Nose normal.  Mouth/Throat: Oropharynx is clear and moist. No oropharyngeal exudate.  Crowded oropharynx  Eyes: Conjunctivae and EOM are normal. Pupils are equal, round, and reactive to light. No scleral icterus.  Neck: Normal range of motion. Neck supple. No thyromegaly present.  Cardiovascular: Normal rate, regular rhythm, normal heart sounds and intact distal pulses.   No murmur heard. Pulses:      Radial pulses are 2+ on the right side, and 2+ on the left side.  Pulmonary/Chest: Effort normal and breath sounds normal. No respiratory distress. He has no wheezes. He has no rales.  Abdominal: Soft. Bowel sounds are normal. He exhibits no distension and no mass. There is no tenderness. There is no rebound and no guarding.  Musculoskeletal: Normal range of motion. He exhibits no edema.  Lymphadenopathy:    He has no cervical adenopathy.  Neurological: He is alert and oriented to person, place, and time.  CN grossly intact, station and gait intact  Skin: Skin is warm and dry. No rash noted.  Psychiatric: He has a normal mood and affect. His behavior is normal. Judgment and thought content normal.       Assessment & Plan:

## 2012-09-18 NOTE — Assessment & Plan Note (Signed)
Preventative protocols reviewed and updated unless pt declined. Discussed healthy diet and lifestyle.  

## 2012-11-29 ENCOUNTER — Other Ambulatory Visit: Payer: Self-pay | Admitting: Family Medicine

## 2012-11-29 DIAGNOSIS — E782 Mixed hyperlipidemia: Secondary | ICD-10-CM

## 2012-12-11 ENCOUNTER — Other Ambulatory Visit: Payer: BC Managed Care – PPO

## 2012-12-18 ENCOUNTER — Ambulatory Visit: Payer: BC Managed Care – PPO | Admitting: Family Medicine

## 2013-01-15 ENCOUNTER — Ambulatory Visit: Payer: BC Managed Care – PPO | Admitting: Family Medicine

## 2013-01-15 ENCOUNTER — Other Ambulatory Visit (INDEPENDENT_AMBULATORY_CARE_PROVIDER_SITE_OTHER): Payer: BC Managed Care – PPO

## 2013-01-15 DIAGNOSIS — E782 Mixed hyperlipidemia: Secondary | ICD-10-CM

## 2013-01-15 LAB — LIPID PANEL
Cholesterol: 180 mg/dL (ref 0–200)
Total CHOL/HDL Ratio: 4
Triglycerides: 233 mg/dL — ABNORMAL HIGH (ref 0.0–149.0)
VLDL: 46.6 mg/dL — ABNORMAL HIGH (ref 0.0–40.0)

## 2013-01-15 LAB — COMPREHENSIVE METABOLIC PANEL
Alkaline Phosphatase: 58 U/L (ref 39–117)
BUN: 16 mg/dL (ref 6–23)
CO2: 27 mEq/L (ref 19–32)
Creatinine, Ser: 1 mg/dL (ref 0.4–1.5)
GFR: 93.27 mL/min (ref >60.00)
Glucose, Bld: 95 mg/dL (ref 70–99)
Total Bilirubin: 0.4 mg/dL (ref 0.3–1.2)

## 2013-01-22 ENCOUNTER — Ambulatory Visit: Payer: BC Managed Care – PPO | Admitting: Family Medicine

## 2013-01-22 DIAGNOSIS — Z0289 Encounter for other administrative examinations: Secondary | ICD-10-CM

## 2013-08-17 ENCOUNTER — Ambulatory Visit: Payer: BC Managed Care – PPO | Admitting: Family Medicine

## 2015-01-22 ENCOUNTER — Ambulatory Visit (INDEPENDENT_AMBULATORY_CARE_PROVIDER_SITE_OTHER): Payer: Commercial Managed Care - HMO | Admitting: Primary Care

## 2015-01-22 ENCOUNTER — Encounter: Payer: Self-pay | Admitting: Primary Care

## 2015-01-22 VITALS — BP 122/72 | HR 66 | Temp 98.1°F | Ht 68.75 in | Wt 201.0 lb

## 2015-01-22 DIAGNOSIS — J029 Acute pharyngitis, unspecified: Secondary | ICD-10-CM

## 2015-01-22 LAB — POCT RAPID STREP A (OFFICE): RAPID STREP A SCREEN: NEGATIVE

## 2015-01-22 MED ORDER — AMOXICILLIN 500 MG PO CAPS: 500.0000 mg | ORAL_CAPSULE | Freq: Two times a day (BID) | ORAL | Status: DC

## 2015-01-22 NOTE — Patient Instructions (Addendum)
Start amoxicillin antibiotics. Take 1 tablet by mouth daily for 7 days.  I will notify you of the results of your throat culture.  You may take ibuprofen 600 mg three times daily for pain and inflammation. You may also try gargling warm salt water.  It was a pleasure meeting you!

## 2015-01-22 NOTE — Addendum Note (Signed)
Addended by: Jacqualin Combes on: 01/22/2015 03:07 PM   Modules accepted: Orders

## 2015-01-22 NOTE — Progress Notes (Signed)
Subjective:    Patient ID: Alejandro [name], male    DOB: Apr 17, 1981, 34 y.o.   MRN: [id number]  HPI  Mr. Wrede is a 34 year old male who presents today with a chief complaint of sore throat. His throat has been sore for the past 9 days. He also reported fevers, highest of 102, that lasted for 3 days. He was evaluated [facility] a [facility] one week ago and was found to be strep negative and his sore throat determined to be viral. He continues to feel weakness, tried, decrease in appetite. Denies recent fevers.  Review of Systems  Constitutional: Positive for chills. Negative for fever.  HENT: Positive for ear pain, sore throat and trouble swallowing. Negative for congestion and sinus pressure.   Respiratory: Negative for cough and shortness of breath.   Cardiovascular: Negative for chest pain.  Gastrointestinal: Negative for nausea and vomiting.       Past Medical History  Diagnosis Date  . Hypertension   . Hyperlipidemia   . Obesity   . Plantar fasciitis     followed by ortho    Social History   Social History  . Marital Status: Married    Spouse Name: N/A  . Number of Children: N/A  . Years of Education: N/A   Occupational History  . executive recruiter    Social History Main Topics  . Smoking status: Never Smoker   . Smokeless tobacco: Not on file  . Alcohol Use: 0.0 oz/week     Comment: occasional  . Drug Use: No  . Sexual Activity: Not on file   Other Topics Concern  . Not on file   Social History Narrative    Past Surgical History  Procedure Laterality Date  . US echocardiography  08/2010    normal, no LVH    Family History  Problem Relation Age of Onset  . Hyperlipidemia Father   . CAD Paternal Grandfather 27  . Diabetes Mother     touch of  . Hypertension Maternal Grandfather   . CAD Maternal Grandfather 30    MI, CABG    No Known Allergies  Current Outpatient Prescriptions on File Prior to Visit  Medication Sig Dispense Refill  .  aspirin 81 MG tablet Take 81 mg by mouth every Monday, Wednesday, and Friday.     . hydrochlorothiazide (HYDRODIURIL) 12.5 MG tablet Take 1 tablet (12.5 mg total) by mouth daily. (Patient not taking: Reported on 01/22/2015) 90 tablet 3  . simvastatin (ZOCOR) 40 MG tablet Take 1 tablet (40 mg total) by mouth at bedtime. (Patient not taking: Reported on 01/22/2015) 90 tablet 3   No current facility-administered medications on file prior to visit.    BP 122/72 mmHg  Pulse 66  Temp(Src) 98.1 F (36.7 C) (Oral)  Ht 5' 8.75" (1.746 m)  Wt 201 lb (91.173 kg)  BMI 29.91 kg/m2  SpO2 98%    Objective:   Physical Exam  Constitutional: He appears well-nourished. He does not appear ill.  HENT:  Right Ear: Ear canal normal. Tympanic membrane is retracted. A middle ear effusion is present.  Nose: Right sinus exhibits no maxillary sinus tenderness and no frontal sinus tenderness. Left sinus exhibits no maxillary sinus tenderness and no frontal sinus tenderness.  Mouth/Throat: Posterior oropharyngeal edema present. No oropharyngeal exudate or posterior oropharyngeal erythema.  Cerumen impaction to left canal. No success with removal with plastic curette. Declines irritation.  Cardiovascular: Normal rate and regular rhythm.   Pulmonary/Chest: Effort  normal and breath sounds normal.  Skin: Skin is warm and dry.          Assessment & Plan:  Sore throat:  Present for 9 days with swelling and pain to tonsils. No exudate or erythema. Tonsils enlarged and has difficulty swallowing. Rapid strep: Negative. Will send culture. Despite negative strep, will treat with antibiotics due to presentation and fatigue with fevers. RX for amoxicillin BID x 7 days. Follow up PRN

## 2015-01-22 NOTE — Progress Notes (Signed)
Pre visit review using our clinic review tool, if applicable. No additional management support is needed unless otherwise documented below in the visit note. 

## 2015-01-24 LAB — CULTURE, GROUP A STREP: ORGANISM ID, BACTERIA: NORMAL

## 2015-07-08 ENCOUNTER — Ambulatory Visit (INDEPENDENT_AMBULATORY_CARE_PROVIDER_SITE_OTHER): Payer: Commercial Managed Care - HMO | Admitting: Primary Care

## 2015-07-08 ENCOUNTER — Encounter: Payer: Self-pay | Admitting: Primary Care

## 2015-07-08 VITALS — BP 126/74 | HR 51 | Temp 97.8°F | Ht 68.75 in | Wt 204.8 lb

## 2015-07-08 DIAGNOSIS — J029 Acute pharyngitis, unspecified: Secondary | ICD-10-CM | POA: Diagnosis not present

## 2015-07-08 MED ORDER — LIDOCAINE VISCOUS 2 % MT SOLN: 15.0000 mL | OROMUCOSAL | Status: DC | PRN

## 2015-07-08 MED ORDER — AMOXICILLIN 500 MG PO CAPS: 500.0000 mg | ORAL_CAPSULE | Freq: Two times a day (BID) | ORAL | Status: DC

## 2015-07-08 NOTE — Progress Notes (Signed)
Pre visit review using our clinic review tool, if applicable. No additional management support is needed unless otherwise documented below in the visit note. 

## 2015-07-08 NOTE — Progress Notes (Signed)
Subjective:    Patient ID: Alejandro [name], male    DOB: 07-08-1980, 35 y.o.   MRN: [id number]  HPI  Mr. Alejandro [name] is a 35 year old male who presents today with a chief complaint of sore throat. He also reports fever and ear pain bilaterally. His fever came on Thursday night and was 102 until Saturday morning after taking numerous tablets of ibuprofen and tylenol. He has been without fevers since Friday/Saturday last weekend.   He has a history of sore throat/bacterial infections in the past. His son was ill with the same symptoms last week and was diagnosed with an ear infection.  He's taken advil and tylenol for fevers and sore throat without relief in pain. Denies cough, nasal congestion.   Review of Systems  Constitutional: Positive for fever, chills and fatigue.  HENT: Positive for ear pain and sore throat. Negative for congestion.   Respiratory: Negative for cough.   Musculoskeletal: Negative for myalgias.       Past Medical History  Diagnosis Date  . Hypertension   . Hyperlipidemia   . Obesity   . Plantar fasciitis     followed by ortho    Social History   Social History  . Marital Status: Married    Spouse Name: N/A  . Number of Children: N/A  . Years of Education: N/A   Occupational History  . executive recruiter    Social History Main Topics  . Smoking status: Never Smoker   . Smokeless tobacco: Not on file  . Alcohol Use: 0.0 oz/week     Comment: occasional  . Drug Use: No  . Sexual Activity: Not on file   Other Topics Concern  . Not on file   Social History Narrative    Past Surgical History  Procedure Laterality Date  . US echocardiography  08/2010    normal, no LVH    Family History  Problem Relation Age of Onset  . Hyperlipidemia Father   . CAD Paternal Grandfather 34  . Diabetes Mother     touch of  . Hypertension Maternal Grandfather   . CAD Maternal Grandfather 30    MI, CABG    No Known Allergies  Current Outpatient Prescriptions  on File Prior to Visit  Medication Sig Dispense Refill  . ALPRAZolam (XANAX) 0.5 MG tablet take 1 tablet by mouth three times a day if needed for anxiety  0  . aspirin 81 MG tablet Take 81 mg by mouth every Monday, Wednesday, and Friday.     . escitalopram (LEXAPRO) 20 MG tablet Take 20 mg by mouth daily.  0  . lamoTRIgine (LAMICTAL) 25 MG tablet Take 50 mg by mouth daily.  0  . hydrochlorothiazide (HYDRODIURIL) 12.5 MG tablet Take 1 tablet (12.5 mg total) by mouth daily. (Patient not taking: Reported on 01/22/2015) 90 tablet 3  . simvastatin (ZOCOR) 40 MG tablet Take 1 tablet (40 mg total) by mouth at bedtime. (Patient not taking: Reported on 01/22/2015) 90 tablet 3   No current facility-administered medications on file prior to visit.    BP 126/74 mmHg  Pulse 51  Temp(Src) 97.8 F (36.6 C) (Oral)  Ht 5' 8.75" (1.746 m)  Wt 204 lb 12.8 oz (92.897 kg)  BMI 30.47 kg/m2  SpO2 97%    Objective:   Physical Exam  Constitutional: He appears well-nourished.  HENT:  Right Ear: Tympanic membrane and ear canal normal.  Left Ear: Tympanic membrane and ear canal normal.  Nose: Right sinus  exhibits no maxillary sinus tenderness and no frontal sinus tenderness. Left sinus exhibits no maxillary sinus tenderness and no frontal sinus tenderness.  Mouth/Throat: Posterior oropharyngeal edema and posterior oropharyngeal erythema present. No oropharyngeal exudate.  Cardiovascular: Normal rate and regular rhythm.   Pulmonary/Chest: Effort normal and breath sounds normal. He has no wheezes. He has no rales.  Skin: Skin is warm and dry.          Assessment & Plan:  Sore throat:  Present since Thursday last week with fevers of 102. No fever since Saturday last weekend. Continued sore throat since with difficulty swallowing. Exam with moderate edema and erythema to posterior pharynx. No exudate. Afebrile during exam. Lungs clear. Rapid strep: Negative. Due to presentation, recent infection of  children, and prolonged symptoms, will treat RX for amoxicillin BID sent to pharmacy. Also RX for viscous lidocaine to provide relief. Fluids, rest, tylenol PRN. Follow up PRN.

## 2015-07-08 NOTE — Patient Instructions (Signed)
Start amoxicillin antibiotics. Take 1 tablet by mouth twice daily for 5 days.  You may take the viscous lidocaine as needed for sore throat. Gargle and swallow 15 ml every 4 to 6 hours as needed for sore throat.  Continue tylenol or ibuprofen as needed for pain.  It was a pleasure to see you today!

## 2016-09-17 ENCOUNTER — Encounter (INDEPENDENT_AMBULATORY_CARE_PROVIDER_SITE_OTHER): Payer: Self-pay

## 2016-09-17 ENCOUNTER — Ambulatory Visit (INDEPENDENT_AMBULATORY_CARE_PROVIDER_SITE_OTHER): Payer: Commercial Managed Care - HMO | Admitting: Family Medicine

## 2016-09-17 ENCOUNTER — Ambulatory Visit
Admission: RE | Admit: 2016-09-17 | Discharge: 2016-09-17 | Disposition: A | Discharge status: Home / Self Care | Payer: Commercial Managed Care - HMO | Source: Ambulatory Visit | Attending: Family Medicine | Admitting: Family Medicine

## 2016-09-17 ENCOUNTER — Encounter: Payer: Self-pay | Admitting: Family Medicine

## 2016-09-17 VITALS — BP 142/98 | HR 72 | Temp 97.8°F | Wt 236.2 lb

## 2016-09-17 DIAGNOSIS — S3992XA Unspecified injury of lower back, initial encounter: Secondary | ICD-10-CM | POA: Diagnosis not present

## 2016-09-17 DIAGNOSIS — N503 Cyst of epididymis: Secondary | ICD-10-CM | POA: Insufficient documentation

## 2016-09-17 DIAGNOSIS — N50812 Left testicular pain: Secondary | ICD-10-CM | POA: Insufficient documentation
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
## 2016-09-17 NOTE — Patient Instructions (Signed)
Go to [facility].  They'll call me with the results.  Take care.  Glad to see you.

## 2016-09-17 NOTE — Progress Notes (Signed)
Pre visit review using our clinic review tool, if applicable. No additional management support is needed unless otherwise documented below in the visit note. 

## 2016-09-19 NOTE — Assessment & Plan Note (Signed)
Discussed with patient about differential. Needs urgent ultrasound to define his anatomy and make sure he does not have a surgical issue. Discussed with patient. Sent for imaging at [facility]. Call report discussed with patient. No emergent issues. Okay for outpatient follow-up. Okay to use ibuprofen, supportive underwear, cool compress. Rest over the weekend. If significant pain or change in symptoms then update M.D. over the weekend. He agrees with plan. He overall has benign abdominal exam and I think the abdominal discomfort he was having was referred pain from the testicular source. >25 minutes spent in face to face time with patient, >50% spent in counselling or coordination of care.

## 2016-12-02 ENCOUNTER — Telehealth: Payer: Self-pay | Admitting: Family Medicine

## 2016-12-02 DIAGNOSIS — H04123 Dry eye syndrome of bilateral lacrimal glands: Secondary | ICD-10-CM | POA: Diagnosis not present

## 2016-12-02 DIAGNOSIS — H0289 Other specified disorders of eyelid: Secondary | ICD-10-CM | POA: Diagnosis not present

## 2016-12-02 NOTE — Telephone Encounter (Signed)
Pt called wanting to switch pcp from dr G to dr Damita Dunnings Pt stated he has no issues he hasnt see dr g in years and saw dr Damita Dunnings recently Is it ok to switch

## 2016-12-02 NOTE — Telephone Encounter (Signed)
I last saw 4 yrs ago. Pleasant young patient. I'm ok with switch.

## 2016-12-03 NOTE — Telephone Encounter (Signed)
Okay with me, schedule next CPE with me.  Thanks.

## 2016-12-03 NOTE — Telephone Encounter (Signed)
Left message asking pt to call office  °

## 2016-12-13 ENCOUNTER — Encounter: Payer: Self-pay | Admitting: Physician Assistant

## 2016-12-13 ENCOUNTER — Ambulatory Visit (INDEPENDENT_AMBULATORY_CARE_PROVIDER_SITE_OTHER): Payer: 59 | Admitting: Physician Assistant

## 2016-12-13 VITALS — BP 138/80 | HR 69 | Temp 97.9°F | Ht 68.75 in | Wt 233.0 lb

## 2016-12-13 DIAGNOSIS — J039 Acute tonsillitis, unspecified: Secondary | ICD-10-CM

## 2016-12-13 MED ORDER — PREDNISONE 20 MG PO TABS: 40.0000 mg | ORAL_TABLET | Freq: Every day | ORAL | 0 refills | Status: DC

## 2016-12-13 MED ORDER — AMOXICILLIN-POT CLAVULANATE 875-125 MG PO TABS: 1.0000 | ORAL_TABLET | Freq: Two times a day (BID) | ORAL | 0 refills | Status: AC

## 2016-12-13 NOTE — Patient Instructions (Signed)
Start the oral steroid and antibiotic. Take with food.  If you develop severe one-sided throat pain, difficulty breathing or shortness of breath, please go to the emergency room.  When you are feeling better, consider using Debrox to get your ears cleaned, you can also make an appointment for this.    Pharyngitis Pharyngitis is redness, pain, and swelling (inflammation) of your pharynx. What are the causes? Pharyngitis is usually caused by infection. Most of the time, these infections are from viruses (viral) and are part of a cold. However, sometimes pharyngitis is caused by bacteria (bacterial). Pharyngitis can also be caused by allergies. Viral pharyngitis may be spread from person to person by coughing, sneezing, and personal items or utensils (cups, forks, spoons, toothbrushes). Bacterial pharyngitis may be spread from person to person by more intimate contact, such as kissing. What are the signs or symptoms? Symptoms of pharyngitis include:  Sore throat.  Tiredness (fatigue).  Low-grade fever.  Headache.  Joint pain and muscle aches.  Skin rashes.  Swollen lymph nodes.  Plaque-like film on throat or tonsils (often seen with bacterial pharyngitis).  How is this diagnosed? Your health care provider will ask you questions about your illness and your symptoms. Your medical history, along with a physical exam, is often all that is needed to diagnose pharyngitis. Sometimes, a rapid strep test is done. Other lab tests may also be done, depending on the suspected cause. How is this treated? Viral pharyngitis will usually get better in 3-4 days without the use of medicine. Bacterial pharyngitis is treated with medicines that kill germs (antibiotics). Follow these instructions at home:  Drink enough water and fluids to keep your urine clear or pale yellow.  Only take over-the-counter or prescription medicines as directed by your health care provider: ? If you are prescribed  antibiotics, make sure you finish them even if you start to feel better. ? Do not take aspirin.  Get lots of rest.  Gargle with 8 oz of salt water ( tsp of salt per 1 qt of water) as often as every 1-2 hours to soothe your throat.  Throat lozenges (if you are not at risk for choking) or sprays may be used to soothe your throat. Contact a health care provider if:  You have large, tender lumps in your neck.  You have a rash.  You cough up green, yellow-brown, or bloody spit. Get help right away if:  Your neck becomes stiff.  You drool or are unable to swallow liquids.  You vomit or are unable to keep medicines or liquids down.  You have severe pain that does not go away with the use of recommended medicines.  You have trouble breathing (not caused by a stuffy nose). This information is not intended to replace advice given to you by your health care provider. Make sure you discuss any questions you have with your health care provider. Document Released: 05/31/2005 Document Revised: 11/06/2015 Document Reviewed: 02/05/2013 Elsevier Interactive Patient Education  2017 [employer].

## 2016-12-13 NOTE — Progress Notes (Signed)
Alejandro [name] is a 36 y.o. male here for   I acted as a Education administrator for [employer], PA-C Alejandro Pickler, LPN  History of Present Illness:   Chief Complaint  Patient presents with  . Sore Throat    Sore Throat   This is a new problem. Episode onset: x 1 week. The problem has been gradually worsening. There has been no fever. The pain is at a severity of 9/10. Associated symptoms include congestion, a hoarse voice, neck pain and trouble swallowing. Pertinent negatives include no coughing, ear discharge, headaches or shortness of breath. He has tried acetaminophen (Advil) for the symptoms. The treatment provided no relief.  Otalgia   There is pain in both ears. This is a new problem. Episode onset: x 4 days. The problem occurs constantly. The problem has been gradually worsening. There has been no fever. The pain is at a severity of 3/10. The pain is mild. Associated symptoms include neck pain. Pertinent negatives include no coughing, ear discharge, headaches or hearing loss. He has tried acetaminophen (Advil) for the symptoms. The treatment provided mild relief. His past medical history is significant for a chronic ear infection.   Sore throat is his "worst" symptom. He has two children in daycare. Denies stridor, severe one-sided neck pain, or uncontrolled drooling.  Past Medical History:  Diagnosis Date  . Hyperlipidemia   . Hypertension   . Obesity   . Plantar fasciitis    followed by ortho     Social History   Social History  . Marital status: Married    Spouse name: N/A  . Number of children: N/A  . Years of education: N/A   Occupational History  . executive recruiter Alejandro [name]   Social History Main Topics  . Smoking status: Never Smoker  . Smokeless tobacco: Never Used  . Alcohol use 0.0 oz/week     Comment: rare  . Drug use: No  . Sexual activity: Not on file   Other Topics Concern  . Not on file   Social History Narrative  . No narrative on file     Past Surgical History:  Procedure Laterality Date  . US ECHOCARDIOGRAPHY  08/2010   normal, no LVH    Family History  Problem Relation Age of Onset  . Hyperlipidemia Father   . CAD Paternal Grandfather 23  . Diabetes Mother        touch of  . Hypertension Maternal Grandfather   . CAD Maternal Grandfather 30       MI, CABG    No Known Allergies  Current Medications:   Current Outpatient Prescriptions:  .  escitalopram (LEXAPRO) 20 MG tablet, Take 20 mg by mouth daily., Disp: , Rfl: 0 .  ALPRAZolam (XANAX) 0.5 MG tablet, take 1 tablet by mouth three times a day if needed for anxiety, Disp: , Rfl: 0 .  amoxicillin-clavulanate (AUGMENTIN) 875-125 MG tablet, Take 1 tablet by mouth 2 (two) times daily., Disp: 20 tablet, Rfl: 0 .  predniSONE (DELTASONE) 20 MG tablet, Take 2 tablets (40 mg total) by mouth daily., Disp: 10 tablet, Rfl: 0   Review of Systems:   Review of Systems  Constitutional: Positive for malaise/fatigue. Negative for chills, fever and weight loss.  HENT: Positive for congestion, hoarse voice and trouble swallowing. Negative for ear discharge and hearing loss.   Respiratory: Negative for cough, sputum production and shortness of breath.   Musculoskeletal: Positive for neck pain.  Neurological: Negative for dizziness and headaches.  Vitals:   Vitals:   12/13/16 0858  BP: 138/80  Pulse: 69  Temp: 97.9 F (36.6 C)  TempSrc: Oral  SpO2: 97%  Weight: 233 lb (105.7 kg)  Height: 5' 8.75" (1.746 m)     Body mass index is 34.66 kg/m.  Physical Exam:   Physical Exam  Constitutional: He appears well-developed. He is cooperative.  Non-toxic appearance. He does not have a sickly appearance. He does not appear ill. No distress.  HENT:  Head: Normocephalic and atraumatic.  Right Ear: Tympanic membrane, external ear and ear canal normal. Tympanic membrane is not erythematous, not retracted and not bulging.  Left Ear: Tympanic membrane, external ear and ear  canal normal. Tympanic membrane is not erythematous, not retracted and not bulging.  Nose: Nose normal. Right sinus exhibits no maxillary sinus tenderness and no frontal sinus tenderness. Left sinus exhibits no maxillary sinus tenderness and no frontal sinus tenderness.  Mouth/Throat: Uvula is midline and mucous membranes are normal. Posterior oropharyngeal erythema present. No posterior oropharyngeal edema. Tonsils are 3+ on the right. Tonsils are 3+ on the left. Tonsillar exudate.  Eyes: Conjunctivae and lids are normal.  Neck: Trachea normal.  Cardiovascular: Normal rate, regular rhythm, S1 normal, S2 normal and normal heart sounds.   Pulmonary/Chest: Effort normal and breath sounds normal. He has no decreased breath sounds. He has no wheezes. He has no rhonchi. He has no rales.  Lymphadenopathy:    He has cervical adenopathy.  Neurological: He is alert.  Skin: Skin is warm, dry and intact.  Psychiatric: He has a normal mood and affect. His speech is normal and behavior is normal.  Nursing note and vitals reviewed.   Assessment and Plan:    [name] was seen today for sore throat.  Diagnoses and all orders for this visit:  Exudative tonsillitis  Other orders -     amoxicillin-clavulanate (AUGMENTIN) 875-125 MG tablet; Take 1 tablet by mouth 2 (two) times daily. -     predniSONE (DELTASONE) 20 MG tablet; Take 2 tablets (40 mg total) by mouth daily.   Given exposures to his sick child at home with similar symptoms, appearance of symptoms and physical exam, will treat with Augmentin and Prednisone per orders. I advised patient to follow-up with PCP if worsening symptoms, especially if worsening SOB, one-sided pain, difficulty breathing.   . Reviewed expectations re: course of current medical issues. . Discussed self-management of symptoms. . Outlined signs and symptoms indicating need for more acute intervention. . Patient verbalized understanding and all questions were  answered. . See orders for this visit as documented in the electronic medical record. . Patient received an After-Visit Summary.  CMA or LPN served as scribe during this visit. History, Physical, and Plan performed by medical provider. Documentation and orders reviewed and attested to.  Inda Coke, PA-C

## 2017-02-02 DIAGNOSIS — H900 Conductive hearing loss, bilateral: Secondary | ICD-10-CM | POA: Diagnosis not present

## 2017-02-02 DIAGNOSIS — H6122 Impacted cerumen, left ear: Secondary | ICD-10-CM | POA: Diagnosis not present

## 2017-02-02 DIAGNOSIS — H6121 Impacted cerumen, right ear: Secondary | ICD-10-CM | POA: Diagnosis not present

## 2017-04-11 DIAGNOSIS — D3141 Benign neoplasm of right ciliary body: Secondary | ICD-10-CM | POA: Diagnosis not present

## 2017-04-11 DIAGNOSIS — H21231 Degeneration of iris (pigmentary), right eye: Secondary | ICD-10-CM | POA: Diagnosis not present

## 2017-04-27 ENCOUNTER — Ambulatory Visit: Payer: 59 | Admitting: Family Medicine

## 2017-05-04 ENCOUNTER — Encounter: Payer: Self-pay | Admitting: Family Medicine

## 2017-05-04 ENCOUNTER — Ambulatory Visit: Payer: 59 | Admitting: Family Medicine

## 2017-05-04 VITALS — BP 130/100 | HR 87 | Temp 98.3°F | Wt 258.0 lb

## 2017-05-04 DIAGNOSIS — R030 Elevated blood-pressure reading, without diagnosis of hypertension: Secondary | ICD-10-CM | POA: Insufficient documentation

## 2017-05-04 DIAGNOSIS — B9689 Other specified bacterial agents as the cause of diseases classified elsewhere: Secondary | ICD-10-CM | POA: Diagnosis not present

## 2017-05-04 DIAGNOSIS — J208 Acute bronchitis due to other specified organisms: Secondary | ICD-10-CM | POA: Diagnosis not present

## 2017-05-04 MED ORDER — AZITHROMYCIN 250 MG PO TABS: ORAL_TABLET | ORAL | 0 refills | Status: DC

## 2017-05-04 MED ORDER — ALBUTEROL SULFATE HFA 108 (90 BASE) MCG/ACT IN AERS: 2.0000 | INHALATION_SPRAY | Freq: Four times a day (QID) | RESPIRATORY_TRACT | 0 refills | Status: DC | PRN

## 2017-05-04 MED ORDER — PREDNISONE 20 MG PO TABS: ORAL_TABLET | ORAL | 0 refills | Status: DC

## 2017-05-04 NOTE — Progress Notes (Signed)
BP (!) 130/100 (BP Location: Right Arm, Cuff Size: Large)   Pulse 87   Temp 98.3 F (36.8 C) (Oral)   Wt 258 lb (117 kg)   SpO2 96%   BMI 38.38 kg/m    CC: cough Subjective:    Patient ID: Alejandro [name], male    DOB: 1980-09-04, 36 y.o.   MRN: [id number]  HPI: Alejandro [name] is a 36 y.o. male presenting on 05/04/2017 for Cough (productive. Started 5-6 wks. Has hard time talking without coughing. Has taken Nyquil and ibuprofen) and Headache (located in posterior head. Started a few wks ago, worsening)   5-6 wk h/o nagging cough, progressively worsening. Started as URI with congestion, sore throat. Persistent fatigue, malaise, with cough. Persistent productive cough of yellow mucous. Subjective fevers, not recently. Mild bilat earache. Deep cough from chest with chest congestion. Coughing fits. Some wheezing and dyspnea.   No tooth pain, ST, PNdrainage. No GERD sxs. No allergic rhinitis.  No known h/o asthma.  Non smoker Treating with nyquil at night and ibuprofen during the day.   Relevant past medical, surgical, family and social history reviewed and updated as indicated. Interim medical history since our last visit reviewed. Allergies and medications reviewed and updated. Outpatient Medications Prior to Visit  Medication Sig Dispense Refill  . ALPRAZolam (XANAX) 0.5 MG tablet take 1 tablet by mouth three times a day if needed for anxiety  0  . escitalopram (LEXAPRO) 20 MG tablet Take 20 mg by mouth daily.  0  . predniSONE (DELTASONE) 20 MG tablet Take 2 tablets (40 mg total) by mouth daily. 10 tablet 0   No facility-administered medications prior to visit.      Per HPI unless specifically indicated in ROS section below Review of Systems     Objective:    BP (!) 130/100 (BP Location: Right Arm, Cuff Size: Large)   Pulse 87   Temp 98.3 F (36.8 C) (Oral)   Wt 258 lb (117 kg)   SpO2 96%   BMI 38.38 kg/m   Wt Readings from Last 3 Encounters:  05/04/17 258 lb (117  kg)  12/13/16 233 lb (105.7 kg)  09/17/16 236 lb 4 oz (107.2 kg)    Physical Exam  Constitutional: He appears well-developed and well-nourished. No distress.  HENT:  Head: Normocephalic and atraumatic.  Right Ear: Hearing, tympanic membrane, external ear and ear canal normal.  Left Ear: Hearing, tympanic membrane, external ear and ear canal normal.  Nose: Nose normal. No mucosal edema or rhinorrhea. Right sinus exhibits no maxillary sinus tenderness and no frontal sinus tenderness. Left sinus exhibits no maxillary sinus tenderness and no frontal sinus tenderness.  Mouth/Throat: Uvula is midline and mucous membranes are normal. Posterior oropharyngeal erythema (mild) present. No oropharyngeal exudate, posterior oropharyngeal edema or tonsillar abscesses.  Eyes: Conjunctivae and EOM are normal. Pupils are equal, round, and reactive to light. No scleral icterus.  Neck: Normal range of motion. Neck supple.  Cardiovascular: Normal rate, regular rhythm, normal heart sounds and intact distal pulses.  No murmur heard. Pulmonary/Chest: Effort normal. No respiratory distress. He has no decreased breath sounds. He has wheezes (end exp). He has no rhonchi. He has no rales.  Lymphadenopathy:    He has no cervical adenopathy.  Skin: Skin is warm and dry. No rash noted.  Nursing note and vitals reviewed.   Lab Results  Component Value Date   TSH 2.23 08/31/2010       Assessment & Plan:  Problem List Items Addressed This Visit    Acute bacterial bronchitis - Primary    Acute bacterial bronchitis given prolonged duration and progression of symptoms. Anticipate reactive airway component given wheezing heard today. Treat with steroid course, albuterol inhaler, and zpack antibiotic. Update if not improving with treatment. Pt agrees with plan.       Relevant Medications   azithromycin (ZITHROMAX) 250 MG tablet   Elevated blood pressure reading    Pt will monitor closely at home and let us know if  persistently elevated for further evaluation. He will return in next few weeks for follow up.           Follow up plan: Return if symptoms worsen or fail to improve.  Ria Bush, MD

## 2017-05-04 NOTE — Patient Instructions (Addendum)
Keep an eye on blood pressures- goal <140/90. If consistently staying elevated, let me know. You do have bronchitis with reactive airways - treat with steroid, albuterol inhaler as needed, and zpack antibiotic. May use plain mucinex (guaifenesin) with plenty of water to help mobilize mucous. May use OTC robitussin or delsym for cough suppression.  Let us know if not improving with treatment.

## 2017-05-04 NOTE — Assessment & Plan Note (Signed)
Pt will monitor closely at home and let us know if persistently elevated for further evaluation. He will return in next few weeks for follow up.

## 2017-05-04 NOTE — Assessment & Plan Note (Addendum)
Acute bacterial bronchitis given prolonged duration and progression of symptoms. Anticipate reactive airway component given wheezing heard today. Treat with steroid course, albuterol inhaler, and zpack antibiotic. Update if not improving with treatment. Pt agrees with plan.

## 2017-05-12 ENCOUNTER — Ambulatory Visit: Payer: Self-pay | Admitting: Family Medicine

## 2017-05-18 ENCOUNTER — Ambulatory Visit (INDEPENDENT_AMBULATORY_CARE_PROVIDER_SITE_OTHER): Payer: 59 | Admitting: Family Medicine

## 2017-05-18 ENCOUNTER — Encounter: Payer: Self-pay | Admitting: Family Medicine

## 2017-05-18 VITALS — BP 128/98 | HR 82 | Temp 98.3°F | Wt 250.0 lb

## 2017-05-18 DIAGNOSIS — Z23 Encounter for immunization: Secondary | ICD-10-CM

## 2017-05-18 DIAGNOSIS — I10 Essential (primary) hypertension: Secondary | ICD-10-CM

## 2017-05-18 DIAGNOSIS — Z0001 Encounter for general adult medical examination with abnormal findings: Secondary | ICD-10-CM | POA: Diagnosis not present

## 2017-05-18 DIAGNOSIS — R0683 Snoring: Secondary | ICD-10-CM

## 2017-05-18 DIAGNOSIS — F410 Panic disorder [episodic paroxysmal anxiety] without agoraphobia: Secondary | ICD-10-CM

## 2017-05-18 DIAGNOSIS — E669 Obesity, unspecified: Secondary | ICD-10-CM

## 2017-05-18 DIAGNOSIS — Z7189 Other specified counseling: Secondary | ICD-10-CM

## 2017-05-18 NOTE — Progress Notes (Signed)
CPE- See plan.  Routine anticipatory guidance given to patient.  See health maintenance.  The possibility exists that previously documented standard health maintenance information may have been brought forward from a previous encounter into this note.  If needed, that same information has been updated to reflect the current situation based on today's encounter.    Tetanus done 2012 per patient report.  Flu d/w pt.  Done today.   PNA and shingles not due Colon and prostate cancer screening not due Living will d/w pt. Wife designated if patient were incapacitated.   Diet and exercise d/w pt.   HIV screening d/w pt. Prev done ~2008.    Weight d/w pt.  "I'm attacking it to get my weight down."  Prev runner, back in his 43s.  He had gotten down to ~200 prev with Atkins diet.  He is back down about 10 lbs recently.  D/w pt about exercise and diet.  He is walking more.  Discussed.    Snoring d/w pt.  Worse with weight gain.  Better with weight loss.  He does have some HAs but that is some better with weight loss.  He has lower back pain, h/o plantar fasciitis, presumed that his weight affects both.    Rare use of xanax for anxiety.   Still on lexapro.  Prev with more panic sx with prev job.  Better with current job.    Hypertension:    Using medication without problems or lightheadedness: yes Chest pain with exertion:no Edema:no Short of breath:no  L eyelids are floppy.  He has seen eye clinic in the meantime.   No more testicle pain in the meantime.    PMH and SH reviewed  Meds, vitals, and allergies reviewed.   ROS: Per HPI.  Unless specifically indicated otherwise in HPI, the patient denies:  General: fever. Eyes: acute vision changes ENT: sore throat Cardiovascular: chest pain Respiratory: SOB GI: vomiting GU: dysuria Musculoskeletal: acute back pain Derm: acute rash Neuro: acute motor dysfunction Psych: worsening mood Endocrine: polydipsia Heme: bleeding Allergy:  hayfever  GEN: nad, alert and oriented HEENT: mucous membranes moist, extra tissue noted on L eyelids.  NECK: supple w/o LA CV: rrr. PULM: ctab, no inc wob ABD: soft, +bs EXT: no edema SKIN: no acute rash

## 2017-05-18 NOTE — Patient Instructions (Addendum)
Go to the lab on the way out.  We'll contact you with your lab report. Don't change your meds for now.  Goal weight loss 2 lbs per month.  Look at the [place] for variation.  Take care.  Glad to see you.

## 2017-05-19 DIAGNOSIS — Z7189 Other specified counseling: Secondary | ICD-10-CM | POA: Insufficient documentation

## 2017-05-19 DIAGNOSIS — F410 Panic disorder [episodic paroxysmal anxiety] without agoraphobia: Secondary | ICD-10-CM | POA: Insufficient documentation

## 2017-05-19 LAB — LIPID PANEL
Cholesterol: 384 mg/dL — ABNORMAL HIGH (ref 0–200)
HDL: 50 mg/dL (ref >39.00)
NonHDL: 333.54
Total CHOL/HDL Ratio: 8
Triglycerides: 328 mg/dL — ABNORMAL HIGH (ref 0.0–149.0)
VLDL: 65.6 mg/dL — ABNORMAL HIGH (ref 0.0–40.0)

## 2017-05-19 LAB — COMPREHENSIVE METABOLIC PANEL WITH GFR
ALT: 155 U/L — ABNORMAL HIGH (ref 0–53)
AST: 68 U/L — ABNORMAL HIGH (ref 0–37)
Albumin: 5.2 g/dL (ref 3.5–5.2)
Alkaline Phosphatase: 54 U/L (ref 39–117)
BUN: 17 mg/dL (ref 6–23)
CO2: 26 meq/L (ref 19–32)
Calcium: 9.8 mg/dL (ref 8.4–10.5)
Chloride: 99 meq/L (ref 96–112)
Creatinine, Ser: 0.94 mg/dL (ref 0.40–1.50)
GFR: 95.48 mL/min (ref >60.00)
Glucose, Bld: 77 mg/dL (ref 70–99)
Potassium: 3.9 meq/L (ref 3.5–5.1)
Sodium: 136 meq/L (ref 135–145)
Total Bilirubin: 0.4 mg/dL (ref 0.2–1.2)
Total Protein: 7.9 g/dL (ref 6.0–8.3)

## 2017-05-19 LAB — LDL CHOLESTEROL, DIRECT: Direct LDL: 291 mg/dL

## 2017-05-19 NOTE — Assessment & Plan Note (Signed)
Better with weight loss.  Needs weight loss we can refer for OSA testing/tx if not improved with weight loss, he agrees.

## 2017-05-19 NOTE — Assessment & Plan Note (Signed)
Rare use of xanax for anxiety.   Still on lexapro.  Prev with more panic sx with prev job.  Better with current job.  Okay for outpatient f/u.

## 2017-05-19 NOTE — Assessment & Plan Note (Signed)
Living will d/w pt.  Wife designated if patient were incapacitated.   ?

## 2017-05-19 NOTE — Assessment & Plan Note (Signed)
Needs weight loss, d/w pt about [employer] vs [employer].  See notes on labs.  No change in meds at this point.  He agrees.

## 2017-05-19 NOTE — Assessment & Plan Note (Signed)
D/w pt about diet and exercise.

## 2017-05-19 NOTE — Assessment & Plan Note (Signed)
Tetanus done 2012 per patient report.  Flu d/w pt.  Done today.   PNA and shingles not due Colon and prostate cancer screening not due Living will d/w pt. Wife designated if patient were incapacitated.   Diet and exercise d/w pt.   HIV screening d/w pt. Prev done ~2008.

## 2017-05-24 ENCOUNTER — Other Ambulatory Visit: Payer: Self-pay | Admitting: Family Medicine

## 2017-05-24 DIAGNOSIS — R945 Abnormal results of liver function studies: Principal | ICD-10-CM

## 2017-05-24 DIAGNOSIS — R7989 Other specified abnormal findings of blood chemistry: Secondary | ICD-10-CM

## 2017-05-27 ENCOUNTER — Ambulatory Visit
Admission: RE | Admit: 2017-05-27 | Discharge: 2017-05-27 | Disposition: A | Discharge status: Home / Self Care | Payer: 59 | Source: Ambulatory Visit | Attending: Family Medicine | Admitting: Family Medicine

## 2017-05-27 DIAGNOSIS — R7989 Other specified abnormal findings of blood chemistry: Secondary | ICD-10-CM | POA: Diagnosis not present

## 2017-05-27 DIAGNOSIS — R945 Abnormal results of liver function studies: Principal | ICD-10-CM

## 2017-05-29 ENCOUNTER — Other Ambulatory Visit: Payer: Self-pay | Admitting: Family Medicine

## 2017-05-29 ENCOUNTER — Encounter: Payer: Self-pay | Admitting: Family Medicine

## 2017-05-29 DIAGNOSIS — K760 Fatty (change of) liver, not elsewhere classified: Secondary | ICD-10-CM | POA: Insufficient documentation

## 2017-05-29 DIAGNOSIS — R7989 Other specified abnormal findings of blood chemistry: Secondary | ICD-10-CM | POA: Insufficient documentation

## 2017-05-29 DIAGNOSIS — E782 Mixed hyperlipidemia: Secondary | ICD-10-CM

## 2017-05-29 DIAGNOSIS — R945 Abnormal results of liver function studies: Secondary | ICD-10-CM

## 2017-05-30 ENCOUNTER — Telehealth: Payer: Self-pay | Admitting: Family Medicine

## 2017-05-30 NOTE — Telephone Encounter (Signed)
Patient advised.

## 2017-05-30 NOTE — Telephone Encounter (Signed)
I get his point but I would try a period of time with weight loss to see what he can do.  I didn't want to complicate the situation re: his LFTs.  If he starts a statin and had LFT elevation, it would be unclear if it was from statin start or progression of fatty changes.   Thanks.

## 2017-05-30 NOTE — Telephone Encounter (Signed)
U/S report given to patient and lab visit scheduled for lipids and hepatic panel for a 3 month visit. Pt voiced understanding and wanted to check with his pcp regarding a medication for his cholesterol.

## 2017-06-02 ENCOUNTER — Ambulatory Visit: Payer: 59 | Admitting: Internal Medicine

## 2017-06-02 ENCOUNTER — Encounter: Payer: Self-pay | Admitting: Internal Medicine

## 2017-06-02 VITALS — BP 130/82 | HR 92 | Temp 98.3°F | Wt 244.0 lb

## 2017-06-02 DIAGNOSIS — H1032 Unspecified acute conjunctivitis, left eye: Secondary | ICD-10-CM | POA: Diagnosis not present

## 2017-06-02 MED ORDER — POLYMYXIN B-TRIMETHOPRIM 10000-0.1 UNIT/ML-% OP SOLN: 1.0000 [drp] | OPHTHALMIC | 0 refills | Status: DC

## 2017-06-02 NOTE — Progress Notes (Deleted)
Pt here for 4 days of left eye redness, itching and swelling. Denies fever. Reports clear/yellow drainage throughout the day. When he wakes up in the morning the eye is stuck together. Pt has kids at home and they both currently had colds. Recent bronchitis infection.

## 2017-06-02 NOTE — Patient Instructions (Signed)

## 2017-06-02 NOTE — Progress Notes (Signed)
Subjective:    Patient ID: Alejandro [name], male    DOB: 04-23-1981, 36 y.o.   MRN: [id number]  HPI  Pt presents to the clinic today for left eye, redness itching and swelling. He reports this started 4 days ago. He reports clear/yellow discharge during the day. His eye is matted shut when he wakes up in the morning. He denies visual changes. He denies runny nose, nasal congestion, ear pain or sore throat, but reports he has 2 kids home at sick with upper respiratory illness. He has tried Visine without much relief.   Review of Systems      Past Medical History:  Diagnosis Date  . Hyperlipidemia   . Hypertension   . Obesity   . Plantar fasciitis    followed by ortho    Current Outpatient Medications  Medication Sig Dispense Refill  . albuterol (PROVENTIL HFA;VENTOLIN HFA) 108 (90 Base) MCG/ACT inhaler Inhale 2 puffs into the lungs every 6 (six) hours as needed for wheezing or shortness of breath. 1 Inhaler 0  . ALPRAZolam (XANAX) 0.5 MG tablet take 1 tablet by mouth three times a day if needed for anxiety  0  . escitalopram (LEXAPRO) 20 MG tablet Take 20 mg by mouth daily.  0  . hydrochlorothiazide (HYDRODIURIL) 25 MG tablet Take 12.5 mg by mouth daily.    [name] trimethoprim-polymyxin b (POLYTRIM) ophthalmic solution Place 1 drop into the left eye every 4 (four) hours. 10 mL 0   No current facility-administered medications for this visit.     No Known Allergies  Family History  Problem Relation Age of Onset  . Hyperlipidemia Father   . CAD Paternal Grandfather 79  . Diabetes Mother   . Hypertension Maternal Grandfather   . CAD Maternal Grandfather 30       MI, CABG  . Colon cancer Neg Hx   . Prostate cancer Neg Hx     Social History   Socioeconomic History  . Marital status: Married    Spouse name: Not on file  . Number of children: Not on file  . Years of education: Not on file  . Highest education level: Not on file  Social Needs  . Financial resource strain:  Not on file  . Food insecurity - worry: Not on file  . Food insecurity - inability: Not on file  . Transportation needs - medical: Not on file  . Transportation needs - non-medical: Not on file  Occupational History  . Occupation: Surveyor, quantity: Editor, commissioning  Tobacco Use  . Smoking status: Never Smoker  . Smokeless tobacco: Never Used  Substance and Sexual Activity  . Alcohol use: No    Alcohol/week: 0.0 oz    Frequency: Never  . Drug use: No  . Sexual activity: Not on file  Other Topics Concern  . 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  HEENT: Pt reports left eye redness, itching, swelling and discharge. Denies eye pain, ear pain, ringing in the ears, wax buildup, runny nose, nasal congestion, bloody nose, or sore throat. Respiratory: Denies difficulty breathing, shortness of breath, cough or sputum production.     No other specific complaints in a complete review of systems (except as listed in HPI above).  Objective:   Physical Exam  BP 130/82   Pulse 92   Temp 98.3 F (36.8 C) (Oral)   Wt 244 lb (110.7 kg)   SpO2 98%   BMI 36.30 kg/m  Wt Readings from Last 3 Encounters:  06/02/17 244 lb (110.7 kg)  05/18/17 250 lb (113.4 kg)  05/04/17 258 lb (117 kg)    General: Appears her stated age, obese, in NAD. HEENT: Left Eye: sclera injected, no icterus, conjunctiva erythematous with yellow discharge noted, PERRLA and EOMs intact;  Neck:  No adenopathy noted.  BMET    Component Value Date/Time   NA 136 05/18/2017 1804   K 3.9 05/18/2017 1804   CL 99 05/18/2017 1804   CO2 26 05/18/2017 1804   GLUCOSE 77 05/18/2017 1804   BUN 17 05/18/2017 1804   CREATININE 0.94 05/18/2017 1804   CALCIUM 9.8 05/18/2017 1804    Lipid Panel     Component Value Date/Time    CHOL 384 (H) 05/18/2017 1804   TRIG 328.0 (H) 05/18/2017 1804   HDL 50.00 05/18/2017 1804   CHOLHDL 8 05/18/2017 1804   VLDL 65.6 (H) 05/18/2017 1804   LDLCALC 192 08/28/2010 1833    CBC No results found for: WBC, RBC, HGB, HCT, PLT, MCV, MCH, MCHC, RDW, LYMPHSABS, MONOABS, EOSABS, BASOSABS  Hgb A1C No results found for: HGBA1C         Assessment & Plan:   Bacterial Conjunctivitis, Left Eye:  Warm compresses for 10 minutes  TID prn eRx for Polytrim drops to left eye every 4 hours while awake Work note provided  Return precautions discussed Webb Silversmith, NP

## 2017-07-21 ENCOUNTER — Ambulatory Visit: Payer: Self-pay | Admitting: *Deleted

## 2017-07-21 NOTE — Telephone Encounter (Signed)
Pt stating that he has ear pain in the right ear and is currently rating pain at 10 on a scale from 1-10. Pt has appt tomorrow with Dr. Damita Dunnings at 2:30pm but the pt states he does not know if he can wait until seen at appt because of the pain he is experiencing. Pt states he just returned home from a flight and had increased pain in the right ear since returning. Pt advised to seek treatment at Urgent Care due to complaint of severe pain. Pt hesitant about going to Urgent Care due to wait time. Pt encouraged to seek treatment at Urgent Care due to current symptoms. Pt verbalized understanding and still asked to keep his appt scheduled for tomorrow just in case he did not go to Urgent Care tonight.

## 2017-07-22 ENCOUNTER — Ambulatory Visit: Payer: 59 | Admitting: Family Medicine

## 2017-07-22 ENCOUNTER — Encounter: Payer: Self-pay | Admitting: Family Medicine

## 2017-07-22 VITALS — BP 112/80 | HR 79 | Temp 98.5°F | Wt 223.5 lb

## 2017-07-22 DIAGNOSIS — I10 Essential (primary) hypertension: Secondary | ICD-10-CM

## 2017-07-22 DIAGNOSIS — H66011 Acute suppurative otitis media with spontaneous rupture of ear drum, right ear: Secondary | ICD-10-CM

## 2017-07-22 MED ORDER — HYDROCHLOROTHIAZIDE 12.5 MG PO TABS: 6.2500 mg | ORAL_TABLET | Freq: Every day | ORAL | 3 refills | Status: DC

## 2017-07-22 MED ORDER — AMOXICILLIN-POT CLAVULANATE 875-125 MG PO TABS: 1.0000 | ORAL_TABLET | Freq: Two times a day (BID) | ORAL | 0 refills | Status: DC

## 2017-07-22 NOTE — Progress Notes (Signed)
He has been working hard to lose weight with diet and exercise.  He expected his weight loss to level off.  D/w pt. I thank him for his effort.  Still on lexapro with prn BZD- BZD is not used daily.   D/w pt about HCTZ, needed refill if BP goes back up.  Reasonable to stop given weight and occ lightheadedness.  D/w pt.    R ear pain.  Mult family members with ear infections recently.  Mild cold sx until sig ear pain about 2 days ago.  He had recently flown to and from [place]- yesterday- sig pain with the flight.  May have had a fever, potentially masked with tylenol.    Last night the R ear started leaking fluid, discolored.  Pain is better now but still sore.    Meds, vitals, and allergies reviewed.   ROS: Per HPI unless specifically indicated in ROS section   nad ncat L TM wnl R TM with small hole noted.  Scant amount of discharge in the canal.  No erythema in the canal.  Pinna not tender. Nasal exam slightly stuffy. Sinuses not tender. Oropharynx within normal limits except for mild cobblestoning.  Mucous membranes moist. Neck supple no lymphadenopathy. Weber testing louder on the right side, as expected.

## 2017-07-22 NOTE — Patient Instructions (Addendum)
Start augmentin and keep your ear dry- don't submerge in water.  Recheck here in about 4-5 weeks.  If not resolved then we'll set you up with ENT.  Tylenol and ibuprofen for pain.  Update me as needed.  Take care.  Glad to see you.

## 2017-07-24 DIAGNOSIS — H66011 Acute suppurative otitis media with spontaneous rupture of ear drum, right ear: Secondary | ICD-10-CM | POA: Insufficient documentation

## 2017-07-24 NOTE — Assessment & Plan Note (Signed)
Start augmentin and keep the ear dry- don't submerge in water.  Recheck here in about 4-5 weeks.  If not resolved then we'll set him up with ENT.  Tylenol and ibuprofen for pain.  Update me as needed. He agrees.

## 2017-07-24 NOTE — Assessment & Plan Note (Signed)
May be resolved with weight loss.  Stop hydrochlorothiazide in the meantime.  Can restart if needed.

## 2017-08-11 ENCOUNTER — Other Ambulatory Visit: Payer: Self-pay | Admitting: Family Medicine

## 2017-08-11 NOTE — Telephone Encounter (Signed)
Copied from [place]. Topic: Quick Communication - Rx Refill/Question >> Aug 11, 2017  1:59 PM Oliver Pila B wrote: Medication: Tamiflu Pt's son has the flu and has been prescribed the medication and the childs physician recommended that your pt should call they're doctor for the Rx as well, contact to advise if needed

## 2017-08-12 MED ORDER — OSELTAMIVIR PHOSPHATE 75 MG PO CAPS: 75.0000 mg | ORAL_CAPSULE | Freq: Every day | ORAL | 0 refills | Status: DC

## 2017-08-12 NOTE — Telephone Encounter (Signed)
Son was dx with Influenza A yesterday, 2/28. Son was prescribed tami flu. Pt states son is very sick. (102+ fever)  [facility] [phone] - [place], [place] [phone] (Phone) [phone] (Fax)

## 2017-08-12 NOTE — Telephone Encounter (Signed)
Patient's son has the flu, diagnosed 08/11/17. Patient was told by the son's provider to call and get a prescription for Tamiflu to take to prevent getting the flu from his son.  Last OV: 07/22/17 PCP: [place]:  [facility] [place], [place] - [place] [phone] (Phone) [phone] (Fax)

## 2017-08-12 NOTE — Telephone Encounter (Signed)
rx sent.  Take daily in the meantime.  If he had sx, change to twice a day dosing.  Thanks.

## 2017-08-12 NOTE — Telephone Encounter (Signed)
Left message to call office. PEC: if he calls back, please advise him of Dr Josefine Class message below about tamiflu. Thanks

## 2017-08-12 NOTE — Telephone Encounter (Signed)
Called and left message to call back with information: When was pt's child dx; is pt displaying any sx; and what pharmacy if prescribed.

## 2017-08-26 ENCOUNTER — Encounter: Payer: Self-pay | Admitting: Family Medicine

## 2017-08-26 ENCOUNTER — Other Ambulatory Visit (INDEPENDENT_AMBULATORY_CARE_PROVIDER_SITE_OTHER): Payer: 59

## 2017-08-26 ENCOUNTER — Ambulatory Visit: Payer: 59 | Admitting: Family Medicine

## 2017-08-26 VITALS — BP 110/80 | HR 74 | Temp 98.3°F | Wt 223.0 lb

## 2017-08-26 DIAGNOSIS — E782 Mixed hyperlipidemia: Secondary | ICD-10-CM | POA: Diagnosis not present

## 2017-08-26 DIAGNOSIS — H7290 Unspecified perforation of tympanic membrane, unspecified ear: Secondary | ICD-10-CM

## 2017-08-26 LAB — LIPID PANEL
CHOL/HDL RATIO: 6
Cholesterol: 322 mg/dL — ABNORMAL HIGH (ref 0–200)
HDL: 51.1 mg/dL (ref >39.00)
NONHDL: 271.24
Triglycerides: 294 mg/dL — ABNORMAL HIGH (ref 0.0–149.0)
VLDL: 58.8 mg/dL — AB (ref 0.0–40.0)

## 2017-08-26 LAB — HEPATIC FUNCTION PANEL
ALK PHOS: 59 U/L (ref 39–117)
ALT: 37 U/L (ref 0–53)
AST: 19 U/L (ref 0–37)
Albumin: 4.8 g/dL (ref 3.5–5.2)
BILIRUBIN DIRECT: 0 mg/dL (ref 0.0–0.3)
BILIRUBIN TOTAL: 0.6 mg/dL (ref 0.2–1.2)
Total Protein: 7.6 g/dL (ref 6.0–8.3)

## 2017-08-26 LAB — LDL CHOLESTEROL, DIRECT: LDL DIRECT: 224 mg/dL

## 2017-08-26 NOTE — Progress Notes (Signed)
Here for recheck R ear/TM.  No ear pain.  Done with abx.  No drainage.    He is working on weight loss, d/w pt.  He had fasting labs done today.  Not on statin or BP meds.  D/w pt about pending labs, rationale, etc.    Meds, vitals, and allergies reviewed.   ROS: Per HPI unless specifically indicated in ROS section   nad ncat R pinna wnl.  No drainage.   I can't tell for certain if prev perf is healed on R TM.  No visible TM movement, which could be from ETD and or ET perf.

## 2017-08-26 NOTE — Patient Instructions (Addendum)
We'll contact you with your lab report. No charge for visit- make this a lab visit only.  Update me as needed.  Alejandro [name] will call about your referral.

## 2017-08-28 DIAGNOSIS — H7290 Unspecified perforation of tympanic membrane, unspecified ear: Secondary | ICD-10-CM | POA: Insufficient documentation

## 2017-08-28 NOTE — Assessment & Plan Note (Addendum)
I can't tell for certain if prev perf is healed on R TM.  No visible TM movement, could be from ETD and or ET perf. I can see the site of the previous perforation on the anterior portion of the drum but I cannot tell if it has sealed over with transparent epithelium or if the hole still persist.  Discussed with patient.  Refer to ENT.  He agrees.  No charge visit.  See notes on labs.

## 2017-08-29 ENCOUNTER — Telehealth: Payer: Self-pay

## 2017-08-29 ENCOUNTER — Other Ambulatory Visit: Payer: 59

## 2017-08-29 ENCOUNTER — Encounter: Payer: Self-pay | Admitting: *Deleted

## 2017-08-29 NOTE — Telephone Encounter (Signed)
Unable to contact pt for update on condition.

## 2017-08-29 NOTE — Telephone Encounter (Signed)
PLEASE NOTE: All timestamps contained within this report are represented as [place] Standard Time. CONFIDENTIALTY NOTICE: This fax transmission is intended only for the addressee. It contains information that is legally privileged, confidential or otherwise protected from use or disclosure. If you are not the intended recipient, you are strictly prohibited from reviewing, disclosing, copying using or disseminating any of this information or taking any action in reliance on or regarding this information. If you have received this fax in error, please notify us immediately by telephone so that we can arrange for its return to [place]. Phone: [phone], Toll-Free: [phone], Fax: [phone] Page: 1 of 3 Call Id: [id number] [place] Night - Client >>>Contains Verbal Order - Signature Required<<< [place] Patient Name: [name] Gender: Male DOB: [birth year] Age: 37 Y 2 M 25 D Return Phone Number: [phone] (Primary) Address: City/State/Zip: [place] [address] Client [facility] Night - Client Client Site [place] Physician Renford Dills - MD Contact Type Call Who Is Calling Patient / Member / Family / Caregiver Call Type Triage / Clinical Relationship To Patient Self Return Phone Number [phone] (Primary) Chief Complaint Vomiting Reason for Call Symptomatic / Request for [name] states that he is vomiting and flu symptoms with a fever of 99.5 Translation No Nurse Assessment Nurse: Cox, RN, Allicon Date/Time (Eastern Time): 08/27/2017 11:07:14 AM Confirm and document reason for call. If symptomatic, describe symptoms. ---Caller states he has diarrhea, vomiting, mild fever, may have the flu. Youngest son had flu 2 weeks ago. Oldest son has the flu. Does the patient have any new or worsening symptoms? ---Yes Will a  triage be completed? ---Yes Related visit to physician within the last 2 weeks? ---Yes Does the PT have any chronic conditions? (i.e. diabetes, asthma, etc.) ---No Is this a behavioral health or substance abuse call? ---No Guidelines Guideline Title Affirmed Question Affirmed Notes Nurse Date/Time (Eastern Time) Influenza - Seasonal [1] Patient is NOT HIGH RISK AND [2] strongly requests antiviral medicine AND [3] flu symptoms present < 48 hours Cox, RN, Allicon 9/56/3875 64:33:29 AM Vomiting MILD or MODERATE vomiting (e.g., 1 - 5 times / day) Cox, RN, Allicon 10/30/8414 60:63:01 AM Disp. Time Eilene Ghazi Time) Disposition Final User 08/27/2017 11:16:33 AM Call PCP within 24 Hours Cox, RN, Allicon 11/13/930 35:57:32 AM Called On-Call Provider Cox, RN, Allicon PLEASE NOTE: All timestamps contained within this report are represented as [place] Standard Time. CONFIDENTIALTY NOTICE: This fax transmission is intended only for the addressee. It contains information that is legally privileged, confidential or otherwise protected from use or disclosure. If you are not the intended recipient, you are strictly prohibited from reviewing, disclosing, copying using or disseminating any of this information or taking any action in reliance on or regarding this information. If you have received this fax in error, please notify us immediately by telephone so that we can arrange for its return to [place]. Phone: [phone], Toll-Free: [phone], Fax: [phone] Page: 2 of 3 Call Id: [id number] 08/27/2017 11:17:15 AM Home Care Yes Cox, RN, Allicon Caller Disagree/Comply Comply Caller Understands Yes PreDisposition Home Care Care Advice Given Per Guideline CALL PCP WITHIN 24 HOURS: You need to discuss this with your doctor within the next 24 hours. * IF OFFICE WILL BE OPEN: Call the office when it opens tomorrow morning. CALL BACK IF: * You become worse. CARE ADVICE given per INFLUENZA - SEASONAL (Adult)  guideline. HOME  CARE: You should be able to treat this at home. * You may begin eating bland foods after 8 hours without vomiting. SOLID FOOD: CLEAR LIQUIDS: Try to sip small amounts (1 tablespoon or 15 ml) of liquid frequently (every 5 minutes) for 8 hours, rather than trying to drink a lot of liquid all at one time. * You become worse. CALL BACK IF: CARE ADVICE per Vomiting (Adult) guideline. Verbal Orders/Maintenance Medications Medication Refill Route Dosage Regime Duration Admin Instructions User Name Tamiflu 75 mg Oral twice daily 5 Days Cox, RN, Allicon Comments User: [name], RN Date/Time (Eastern Time): 08/27/2017 11:08:28 AM started last night User: [name], RN Date/Time (Eastern Time): 08/27/2017 11:09:18 AM saw MD yesterday from ruptured eardrum User: [name], RN Date/Time (Eastern Time): 08/27/2017 11:16:01 AM [facility] Phone: [phone] NKA User: [name], RN Date/Time (Eastern Time): 08/27/2017 11:27:12 AM Spoke with Augustin Coupe at pharmacy User: [name], RN Date/Time Eilene Ghazi Time): 08/27/2017 11:28:46 AM Caller informed medication was called in, verbalized understanding Paging DoctorName Phone DateTime Result/Outcome Message Type Notes Martinique, Betty - MD [phone] 08/27/2017 11:21:33 AM Called On Call Provider - Reached Doctor Paged [place], Betty - MD 08/27/2017 11:27:21 AM Spoke with On Call - General Message Result MD informed of caller symptoms and request. See med order. PLEASE NOTE: All timestamps contained within this report are represented as [place] Standard Time. CONFIDENTIALTY NOTICE: This fax transmission is intended only for the addressee. It contains information that is legally privileged, confidential or otherwise protected from use or disclosure. If you are not the intended recipient, you are strictly prohibited from reviewing, disclosing, copying using or disseminating any of this information or taking  any action in reliance on or regarding this information. If you have received this fax in error, please notify us immediately by telephone so that we can arrange for its return to [place]. Phone: [phone], Toll-Free: [phone], Fax: [phone] Page: 3 of 3 Call Id: [phone] [place] >>>Contains Verbal Order - Signature Required<<< [address] [place] [address] [phone] Fax: [phone] [place] Night - Client [place] - Night Date: 08/27/2017 From: [facility] Department To: Renford Dills - MD Please sign the order for the approved drug(s) given by our call center nurse on your behalf. Fax to [phone] within 5 business days. Thank you. Date (Eastern Time): 08/27/2017 11:01:58 AM Triage RN: [name], RN NAME: Georga Kaufmann PHONE NUMBER: [phone] (Primary) BIRTHDATE: Dec 23, 1980 [place] [address] CALLER: Self NAME: Rx Given Medication Refill Route Dosage Regime Duration Admin Instructions User Name Tamiflu 75 mg Oral twice daily 5 Days Cox, RN, Allicon MD Signature Date

## 2017-08-29 NOTE — Telephone Encounter (Signed)
It look like tamiflu was prev sent in.  Agree.  Thanks for trying to get update on patient.  Routed back to [facility].

## 2017-08-29 NOTE — Telephone Encounter (Signed)
Left message on patient's voicemail to return call

## 2017-09-01 DIAGNOSIS — H7291 Unspecified perforation of tympanic membrane, right ear: Secondary | ICD-10-CM | POA: Diagnosis not present

## 2017-09-01 DIAGNOSIS — H6691 Otitis media, unspecified, right ear: Secondary | ICD-10-CM | POA: Diagnosis not present

## 2017-09-20 DIAGNOSIS — B029 Zoster without complications: Secondary | ICD-10-CM | POA: Diagnosis not present

## 2017-09-21 ENCOUNTER — Ambulatory Visit: Payer: 59 | Admitting: Family Medicine

## 2017-09-21 ENCOUNTER — Encounter: Payer: Self-pay | Admitting: Family Medicine

## 2017-09-21 DIAGNOSIS — B029 Zoster without complications: Secondary | ICD-10-CM | POA: Diagnosis not present

## 2017-09-21 MED ORDER — GABAPENTIN 300 MG PO CAPS: 300.0000 mg | ORAL_CAPSULE | Freq: Three times a day (TID) | ORAL | 3 refills | Status: DC

## 2017-09-21 MED ORDER — LIDOCAINE 5 % EX PTCH: 1.0000 | MEDICATED_PATCH | CUTANEOUS | 0 refills | Status: DC

## 2017-09-21 NOTE — Progress Notes (Signed)
R chest wall pain.  No trauma.  Seen at ortho yesterday-he did not know if he had cracked a rib.  Now with rash in the same area.  Sensitive to the touch. Started on valtrex 1 g tid in the meantime.  No other areas with similar pain.   Meds, vitals, and allergies reviewed.   ROS: Per HPI unless specifically indicated in ROS section   nad but uncomfortable.   R sided vesicular abd rash and pain in dermatomal distribution rrr

## 2017-09-21 NOTE — Patient Instructions (Addendum)
Start gabapentin with 1 tab at a time.  Gradually work up to 1 tab 3 times a day and then 2 tabs 3 times a day as needed.   Sedation/vertigo cautions.   Use the patch as needed.   Take care.  Glad to see you.

## 2017-09-22 DIAGNOSIS — B029 Zoster without complications: Secondary | ICD-10-CM | POA: Insufficient documentation

## 2017-09-22 NOTE — Assessment & Plan Note (Signed)
He is already on Valtrex.  Discussed with patient about pain treatment with shingles.  Opiates are not a good option.  Start gabapentin.  Routine cautions given.  Start with 1 tab a day.  He can gradually work up to 2 tablets 3 times a day.  Can use Lidoderm patch.  Update me as needed.  He agrees.

## 2017-09-23 ENCOUNTER — Telehealth: Payer: Self-pay | Admitting: *Deleted

## 2017-09-23 NOTE — Telephone Encounter (Signed)
PA submitted thru [facility] for Lidocaine 5% Patch, awaiting response.

## 2017-09-26 NOTE — Telephone Encounter (Signed)
PA approved.  Notice faxed to pharmacy.

## 2017-11-23 IMAGING — US US ART/VEN ABD/PELV/SCROTUM DOPPLER LTD
1 series · 14 of 25 positions shown · non-contrast
Comparison: 10/13/2011

CLINICAL DATA: Left scrotal pain since 9 a.m. after trauma. Status
post vasectomy 1 year ago

EXAM:
SCROTAL ULTRASOUND
DOPPLER ULTRASOUND OF THE TESTICLES
TECHNIQUE: Complete ultrasound examination of the testicles, epididymis, and
other scrotal structures was performed. Color and spectral Doppler
ultrasound were also utilized to evaluate blood flow to the
testicles.

[Series 1: us art/ven abd/pelv/scrotum doppler ltd · 0.08mm/px · 14 of 63 slices shown]
[im 1/63]
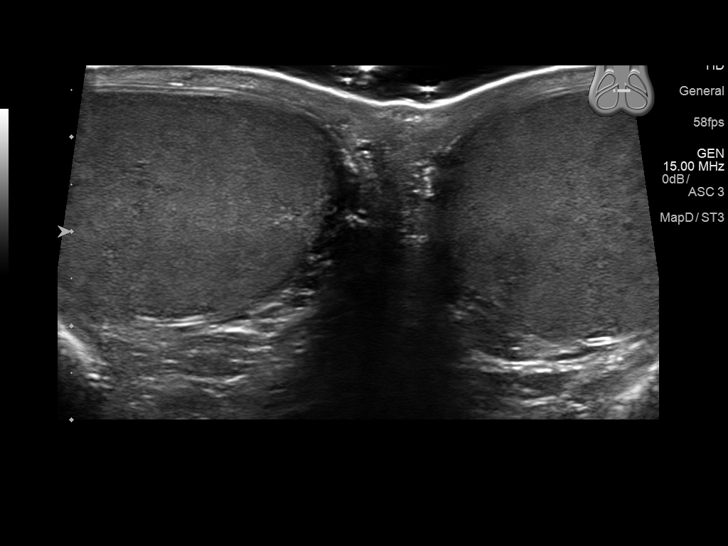
[im 6/63]
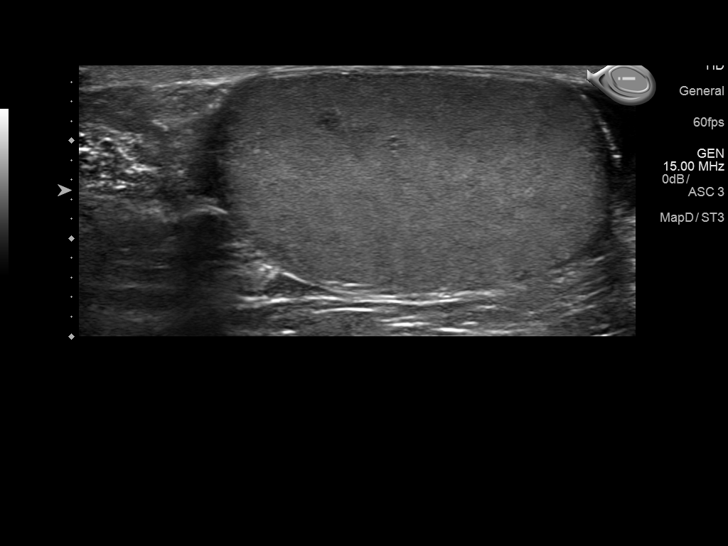
[im 11/63]
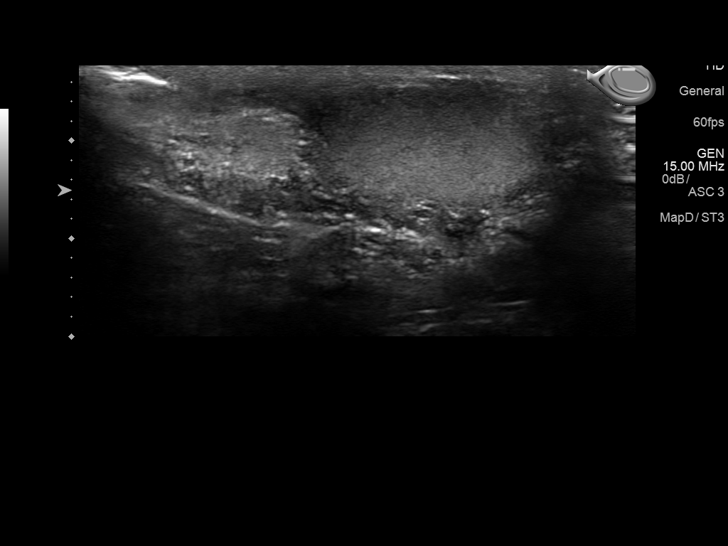
[im 16/63]
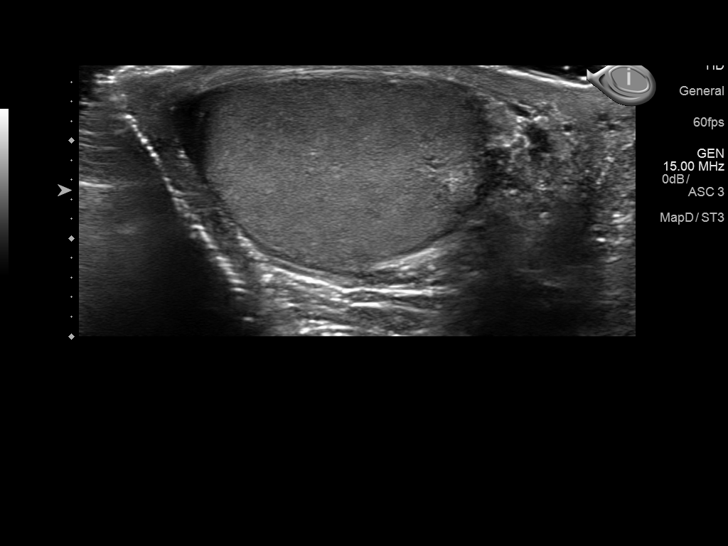
[im 21/63]
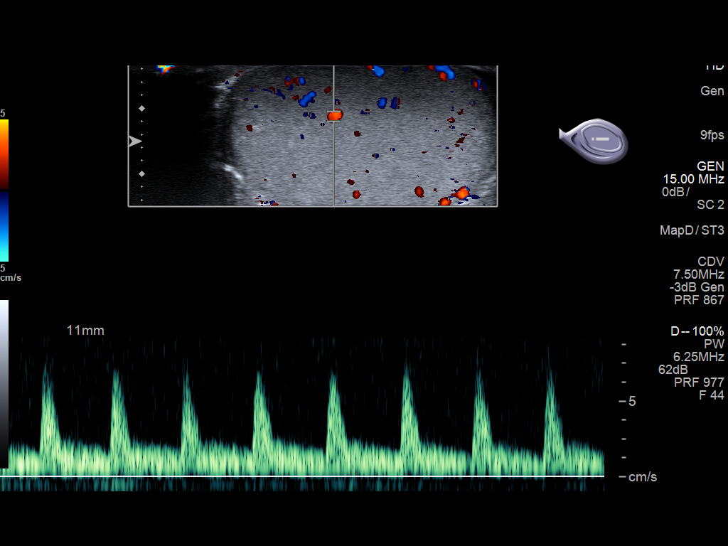
[im 24/63]
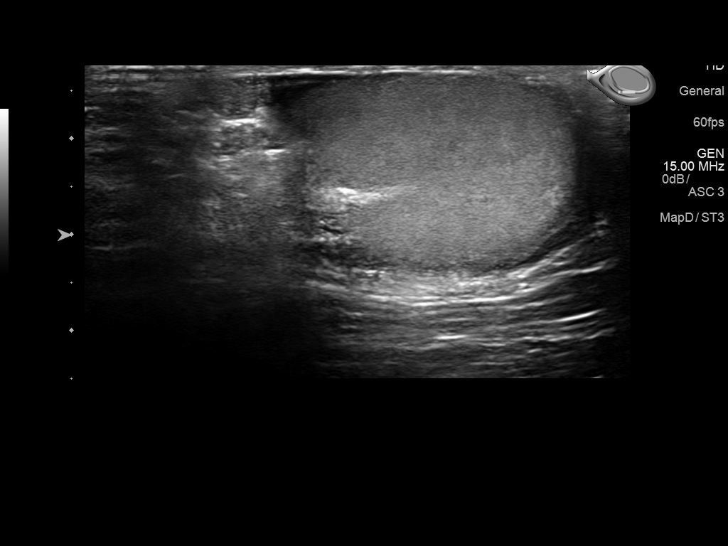
[im 29/63]
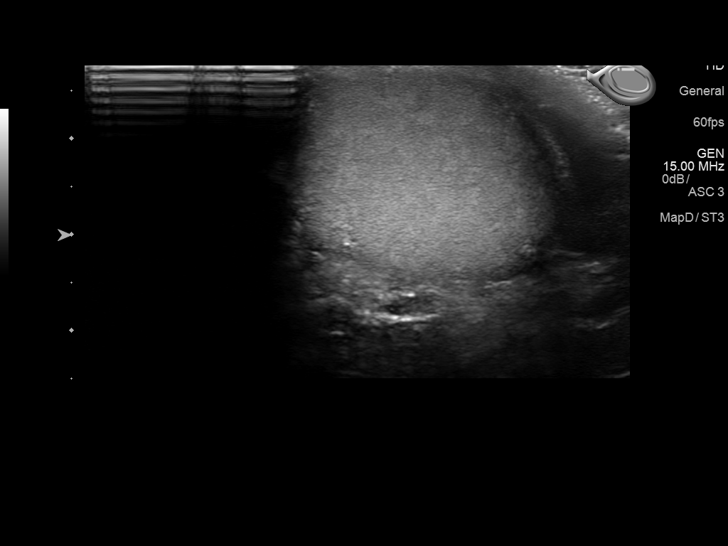
[im 34/63]
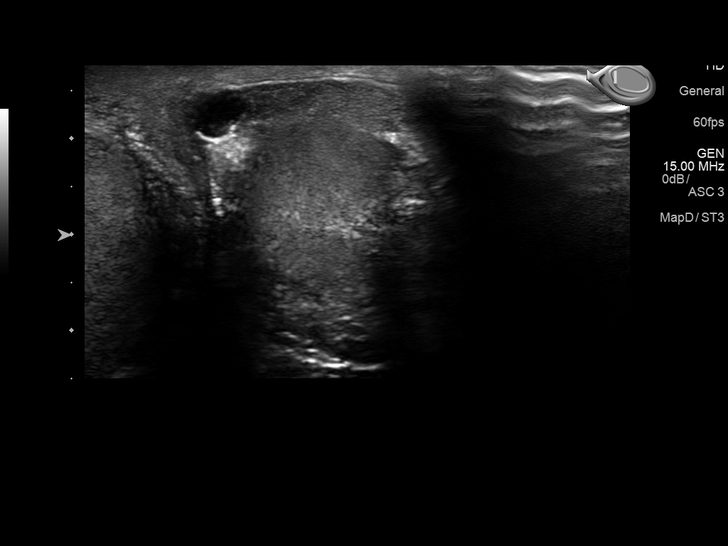
[im 39/63]
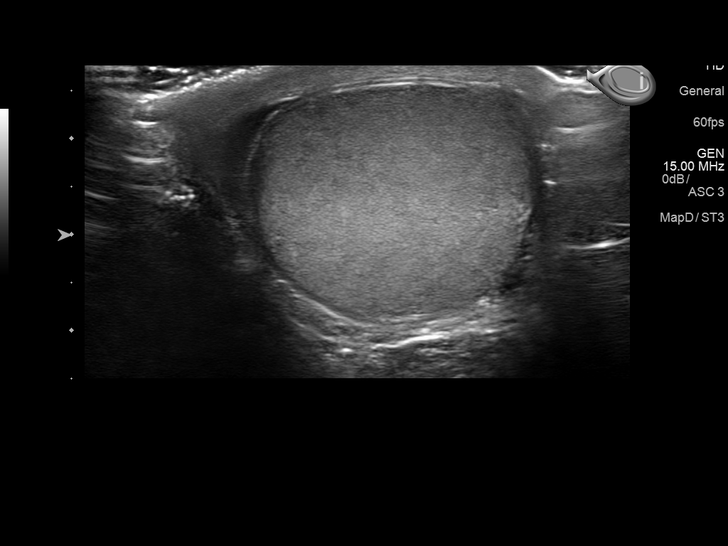
[im 42/63]
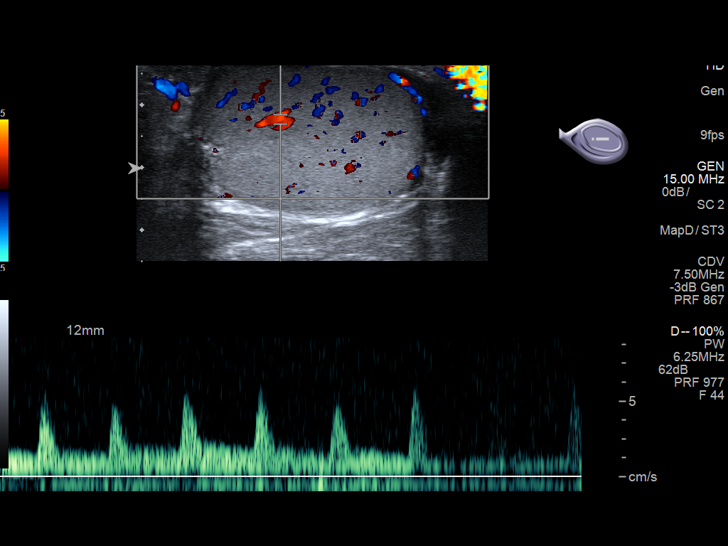
[im 47/63]
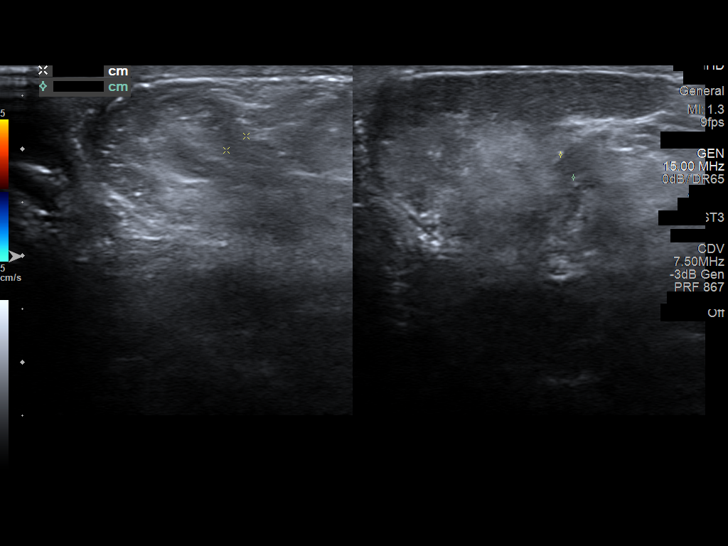
[im 52/63]
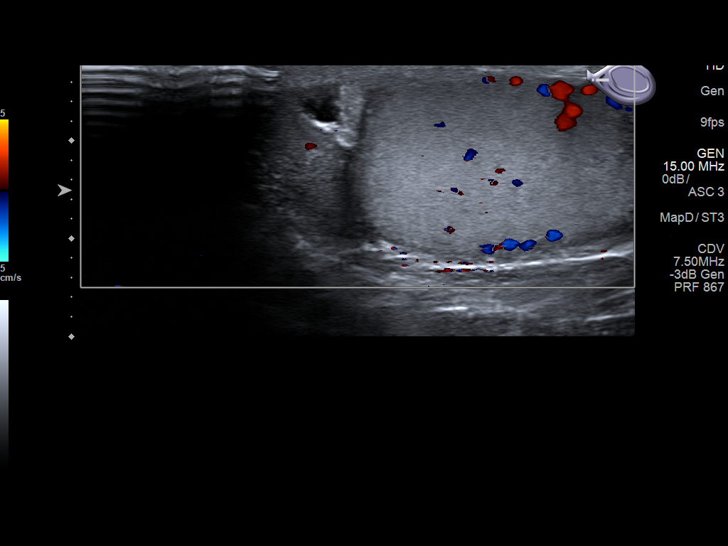
[im 57/63]
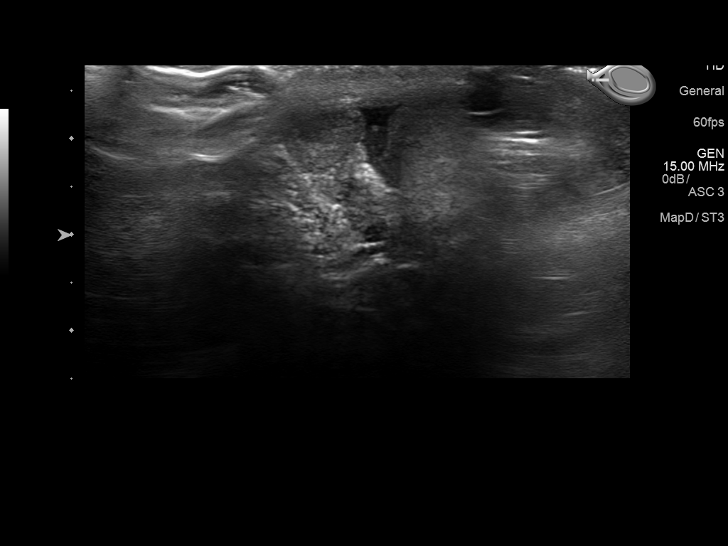
[im 63/63]
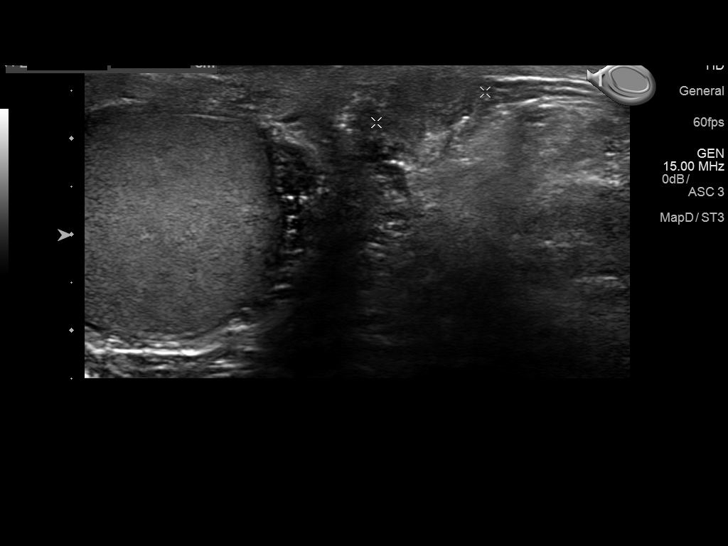

[14 of 25 positions shown; findings below may reference images not displayed]

FINDINGS: Right testicle

Measurements: 42 x 23 x 35 mm.  Unremarkable appearance.

Left testicle

Measurements: 41 x 23 x 28 mm. Unremarkable appearance. Negative for
fracture.

Right epididymis:  Simple 5 mm cyst.

Left epididymis: Cysts measuring up to 6 mm, simple appearing. No
evidence of injury or inflammation.

Hydrocele:  Physiologic volume of simple fluid.  No hydrocele.

Varicocele:  None visualized.

Pulsed Doppler interrogation of both testes demonstrates normal low
resistance arterial and venous waveforms bilaterally.
IMPRESSION: 1. No acute finding.  No evidence of injury.
2. Small bilateral epididymal cysts.

## 2017-11-30 DIAGNOSIS — D3141 Benign neoplasm of right ciliary body: Secondary | ICD-10-CM | POA: Diagnosis not present

## 2018-01-27 ENCOUNTER — Ambulatory Visit: Payer: 59 | Admitting: Family Medicine

## 2018-01-27 ENCOUNTER — Encounter: Payer: Self-pay | Admitting: Family Medicine

## 2018-01-27 VITALS — BP 116/96 | HR 76 | Temp 97.6°F | Ht 68.75 in | Wt 245.5 lb

## 2018-01-27 DIAGNOSIS — R0683 Snoring: Secondary | ICD-10-CM

## 2018-01-27 DIAGNOSIS — M79606 Pain in leg, unspecified: Secondary | ICD-10-CM

## 2018-01-27 NOTE — Patient Instructions (Addendum)
We will call about your referral.  Rosaria Ferries or Azalee Course will call you if you don't see one of them on the way out.  Use the blue theraband to strengthen your lower leg on the front.  Stretch your hamstring and calf.  Update me as needed.  Take care.  Glad to see you.

## 2018-01-27 NOTE — Progress Notes (Signed)
Shingles pain is better.  Still with occ itching locally but not bothersome.    Still on lexapro and rare use of BZD prev, not recently.  SSRI has helped.   L leg pain.  Intermittent sx.  Front and back of the L lower shin.  No h/o DVT in the past.  Sx started episodically years ago, would last a few days and then go away.  Sore locally now.  No swelling.  Not red or hot.  He didn't clearly recall sx on the R leg, but it could have happened previously, episodically.  No trauma.  No bruising.    Sleep d/w pt.  Snoring.  Tired.  Walking from snoring. D/w pt about weight mgmt.    Meds, vitals, and allergies reviewed.   ROS: Per HPI unless specifically indicated in ROS section   nad ncat mmm Bilateral symmetric tonsillar enlargement noted 17.5" neck.   No LA rrr ctab B calf enlargement due to gastrocnemius hypertrophy, not edema.  46cm calf B.  No bands, no cords.  No erythema. Tight hamstrings noted on exam.  Distally neurovascularly intact.

## 2018-01-29 DIAGNOSIS — M79606 Pain in leg, unspecified: Secondary | ICD-10-CM | POA: Insufficient documentation

## 2018-01-29 NOTE — Assessment & Plan Note (Signed)
Refer to pulmonary for sleep apnea testing.  He may end up needing tonsillectomy versus oral appliance.  Discussed with patient about reasonable approach to weight loss with diet and exercise.  Pathophysiology of obstructive sleep apnea discussed with patient.  Still okay for outpatient follow-up.

## 2018-01-29 NOTE — Assessment & Plan Note (Signed)
I am not concerned for DVT.  He is low risk.  He has no history of DVT.  More likely to have muscle pain in the lower extremity due to imbalance of strength.  Significant calf hypertrophy bilaterally. He can use a blue theraband to strengthen the anterior compartment of the lower leg.  Band given to patient.  Discussed with patient about stretching his hamstring and calf. He agrees. Update me as needed.

## 2018-02-09 ENCOUNTER — Encounter: Payer: Self-pay | Admitting: Pulmonary Disease

## 2018-02-09 ENCOUNTER — Ambulatory Visit (INDEPENDENT_AMBULATORY_CARE_PROVIDER_SITE_OTHER): Payer: 59 | Admitting: Pulmonary Disease

## 2018-02-09 VITALS — BP 130/76 | HR 87 | Ht 68.0 in | Wt 248.0 lb

## 2018-02-09 DIAGNOSIS — R0683 Snoring: Secondary | ICD-10-CM

## 2018-02-09 NOTE — Progress Notes (Signed)
Alejandro [name]    [id number]    Aug 08, 1980  Primary Care Physician:Duncan, Elveria Rising, MD  Referring Physician: Tonia Ghent, MD [address] [place], [name] [address]  Chief complaint:   Snoring Excessive daytime sleepiness Nonrestorative sleep  HPI:  History of chronic snoring History of excessive daytime sleepiness Usually goes to bed about 930 to 10:30 PM, wakes up between 630 to 7:00 Gets up about twice during the night Weight has fluctuated over the years Size 17 neck size He has headaches, dry mouth Very loud snoring Comorbidities include hypertension, hypercholesterolemia-increased cardiovascular risk if sleep apnea is not treated  Pets: No pets Occupation: Works as a Human resources officer Exposures: No significant occupational exposures Smoking history: Never smoker No significant family history of sleep disordered breathing  Outpatient Encounter Medications as of 02/09/2018  Medication Sig  . ALPRAZolam (XANAX) 0.5 MG tablet take 1 tablet by mouth three times a day if needed for anxiety  . escitalopram (LEXAPRO) 20 MG tablet Take 20 mg by mouth daily.   No facility-administered encounter medications on file as of 02/09/2018.     Allergies as of 02/09/2018  . (No Known Allergies)    Past Medical History:  Diagnosis Date  . Hyperlipidemia   . Hypertension   . Obesity   . Plantar fasciitis    followed by ortho    Past Surgical History:  Procedure Laterality Date  . US ECHOCARDIOGRAPHY  08/2010   normal, no LVH    Family History  Problem Relation Age of Onset  . Hyperlipidemia Father   . CAD Paternal Grandfather 2  . Diabetes Mother   . Hypertension Maternal Grandfather   . CAD Maternal Grandfather 30       MI, CABG  . Colon cancer Neg Hx   . Prostate cancer Neg Hx     Social History   Socioeconomic History  . Marital status: Married    Spouse name: Not on file  . Number of children: Not on file  . Years of education: Not on  file  . Highest education level: Not on file  Occupational History  . Occupation: Surveyor, quantity: [employer]  . Financial resource strain: Not on file  . Food insecurity:    Worry: Not on file    Inability: Not on file  . Transportation needs:    Medical: Not on file    Non-medical: Not on file  Tobacco Use  . Smoking status: Never Smoker  . Smokeless tobacco: Never Used  Substance and Sexual Activity  . Alcohol use: No    Frequency: Never  . Drug use: No  . Sexual activity: Not on file  Lifestyle  . Physical activity:    Days per week: Not on file    Minutes per session: Not on file  . Stress: Not on file  Relationships  . Social connections:    Talks on phone: Not on file    Gets together: Not on file    Attends religious service: Not on file    Active member of club or organization: Not on file    Attends meetings of clubs or organizations: Not on file    Relationship status: Not on file  . Intimate partner violence:    Fear of current or ex partner: Not on file    Emotionally abused: Not on file    Physically abused: Not on file  Forced sexual activity: Not on file  Other Topics Concern  . 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   Eyes:       History of floppy eyelid  Respiratory: Positive for shortness of breath.   Endocrine: Negative.   Genitourinary: Negative.   Musculoskeletal: Negative.   Skin: Negative.   Allergic/Immunologic: Negative.   Neurological: Negative.     There were no vitals filed for this visit.   Physical Exam  Constitutional: He is oriented to person, place, and time. He appears well-developed and well-nourished.  HENT:  Head: Normocephalic.  Eyes: Pupils are equal, round, and reactive to light. Conjunctivae are normal. Right eye exhibits no discharge. Left  eye exhibits no discharge.  Neck: Normal range of motion. Neck supple. No tracheal deviation present. No thyromegaly present.  Cardiovascular: Normal rate and regular rhythm.  Pulmonary/Chest: Effort normal and breath sounds normal. No respiratory distress. He has no wheezes.  Abdominal: Soft. Bowel sounds are normal. He exhibits no distension. There is no tenderness.  Neurological: He is alert and oriented to person, place, and time. He has normal reflexes. No cranial nerve deficit.  Skin: Skin is warm and dry.  Psychiatric: He has a normal mood and affect.   Data Reviewed:  Health records reviewed  Assessment:  .  High probability of sleep disordered breathing  .  Snoring  .  Morbid obesity  Plan/Recommendations: .  We will plan for home sleep study  .  Encouraged to continue efforts at weight loss  .  Pathophysiology of sleep disordered breathing discussed  .  Options of treatment for sleep disordered breathing discussed  .  Encouraged to call with any concerns  .  Follow-up will be scheduled for 6 to 8 weeks following initiation of treatment   Sherrilyn Rist MD [facility] 02/09/2018, 9:22 AM  CC: Tonia Ghent, MD

## 2018-02-09 NOTE — Patient Instructions (Signed)
High probability of significant sleep disordered breathing  Obesity--already working on weight loss  We will schedule a home sleep study  Options of treatment including an auto titrating CPAP machine  I will see you back in the office about 6 to 8 weeks following initiation of treatment

## 2018-02-21 DIAGNOSIS — G4733 Obstructive sleep apnea (adult) (pediatric): Secondary | ICD-10-CM

## 2018-02-22 DIAGNOSIS — G4733 Obstructive sleep apnea (adult) (pediatric): Secondary | ICD-10-CM

## 2018-02-23 ENCOUNTER — Telehealth: Payer: Self-pay | Admitting: Pulmonary Disease

## 2018-02-23 DIAGNOSIS — G4733 Obstructive sleep apnea (adult) (pediatric): Secondary | ICD-10-CM

## 2018-02-23 NOTE — Telephone Encounter (Signed)
Pt is calling back [phone]

## 2018-02-23 NOTE — Telephone Encounter (Signed)
Called and spoke with patient regarding results.  Informed the patient of results and recommendations today. Placed order  lab titration study for severe  Obstructive sleep apnea with severe oxygen desaturrations           .  Pt verbalized understanding and denied any questions or concerns at this time.  Nothing further needed.

## 2018-02-23 NOTE — Addendum Note (Signed)
Addended by: Georjean Mode on: 02/23/2018 04:52 PM   Modules accepted: Orders

## 2018-02-23 NOTE — Telephone Encounter (Signed)
Dr. Ander Slade has reviewed the home sleep test this showed Severe sleep apnea with moderatley severe oxygen desaturations.   Recommendations  We recommend in lab titration study for severe  Obstructive sleep apnea with severe oxygen desaturrations .   Weight loss measures .   Advise against driving while sleepy & against medication with sedative side effects.   Close clincal follow up.  Atc left message will call back we will need to  Schedule cpap titration study.

## 2018-02-24 ENCOUNTER — Other Ambulatory Visit: Payer: Self-pay | Admitting: *Deleted

## 2018-02-24 DIAGNOSIS — R0683 Snoring: Secondary | ICD-10-CM

## 2018-03-13 ENCOUNTER — Telehealth: Payer: Self-pay

## 2018-03-13 DIAGNOSIS — L989 Disorder of the skin and subcutaneous tissue, unspecified: Secondary | ICD-10-CM

## 2018-03-13 NOTE — Telephone Encounter (Signed)
Patient says he will make an appointment to get it done here, please cancel the referral.

## 2018-03-13 NOTE — Telephone Encounter (Signed)
I can freeze it if it is going to take a long time to get into derm clinic.   Schedule if desired.  I put in the referral.  Thanks.

## 2018-03-13 NOTE — Telephone Encounter (Signed)
Pt called in and stated that the reason for the referral is that he has wart on this right thumb for about 2 or 3 months an is looking to get it removed   Best number  [phone]

## 2018-03-13 NOTE — Telephone Encounter (Signed)
I cancelled the referral.  Thanks.

## 2018-03-13 NOTE — Telephone Encounter (Signed)
Copied from [place] [id number]. Topic: General - Other >> Mar 13, 2018 10:47 AM Janace Aris A wrote: Reason for CRM: Patient called in wanting to get a referral to see a dermatologist, says its not urgent but he would like to see one soon.   Please advise

## 2018-03-13 NOTE — Telephone Encounter (Signed)
Pt request dermatology referral; unable to reach pt to find out why needs dermatology referral.

## 2018-03-21 ENCOUNTER — Ambulatory Visit: Payer: 59 | Admitting: Family Medicine

## 2018-03-21 ENCOUNTER — Encounter: Payer: Self-pay | Admitting: Family Medicine

## 2018-03-21 DIAGNOSIS — B079 Viral wart, unspecified: Secondary | ICD-10-CM | POA: Diagnosis not present

## 2018-03-21 NOTE — Progress Notes (Signed)
R hand 32mm warty lesion.   He wanted evaluation and treatment.  Feeling well otherwise.  No complaints.  Meds, vitals, and allergies reviewed.   ROS: Per HPI unless specifically indicated in ROS section   nad ncat R hand 19mm warty lesion.  Benign-appearing.

## 2018-03-21 NOTE — Patient Instructions (Addendum)
Keep it clean and covered as needed.  It should blister in a few days.   We can refreeze if needed.  Take care.  Glad to see you.  I would get a flu shot each fall.

## 2018-03-22 DIAGNOSIS — B079 Viral wart, unspecified: Secondary | ICD-10-CM | POA: Insufficient documentation

## 2018-03-22 NOTE — Assessment & Plan Note (Signed)
Discussed with patient.  Options outlined for patient.  Reasonable to proceed with freezing with liquid nitrogen.  Risks and benefits discussed.  He is aware there is not a 100% cure rate with any treatment.  Verbal consent obtained.  Frozen and thawed x3 with liquid nitrogen without complication.  Routine instructions given.  Update me as needed.  He agrees.

## 2018-03-26 ENCOUNTER — Ambulatory Visit (HOSPITAL_BASED_OUTPATIENT_CLINIC_OR_DEPARTMENT_OTHER): Payer: 59 | Attending: Pulmonary Disease

## 2018-03-28 ENCOUNTER — Telehealth: Payer: Self-pay

## 2018-03-28 NOTE — Telephone Encounter (Signed)
Copied from [place] [phone] [phone]. Topic: Quick Communication - See Telephone Encounter >> Mar 28, 2018  1:13 PM Hewitt Shorts wrote: Pt was seen on 03/21/18 with Dr. Damita Dunnings un-related to his ear but Dr. Damita Dunnings checked his ear while in office and saw fluid but now the patient is calling feeling that the fluid has increased and is getting ready to fly Out of town and would like to know if an antibiotic can be called in or does he need to make an appt   Best number [phone]  [facility] [address]

## 2018-03-28 NOTE — Telephone Encounter (Signed)
It would be better if patient could get checked.  If erythema on the TM, would potentially need abx.  If not, would need decongestant tx.  Thanks.

## 2018-03-28 NOTE — Telephone Encounter (Signed)
Spoke with patient.   Appointment scheduled.

## 2018-03-30 ENCOUNTER — Ambulatory Visit: Payer: 59 | Admitting: Family Medicine

## 2018-03-30 ENCOUNTER — Encounter: Payer: Self-pay | Admitting: Family Medicine

## 2018-03-30 VITALS — BP 128/70 | HR 92 | Temp 98.4°F | Wt 253.2 lb

## 2018-03-30 DIAGNOSIS — H6590 Unspecified nonsuppurative otitis media, unspecified ear: Secondary | ICD-10-CM | POA: Diagnosis not present

## 2018-03-30 DIAGNOSIS — B079 Viral wart, unspecified: Secondary | ICD-10-CM

## 2018-03-30 MED ORDER — PREDNISONE 20 MG PO TABS: ORAL_TABLET | ORAL | 0 refills | Status: DC

## 2018-03-30 NOTE — Patient Instructions (Signed)
Use prednisone with food.  Two tabs today and two prior to the flight tomorrow.  Use afrin only before the flights.   Gently try to pop your ears.  Update me as needed.  Take care.  Glad to see you.

## 2018-03-30 NOTE — Progress Notes (Signed)
R hand with warty lesion s/p treatment.  Some residual wart now but smaller.  Prev blistered and healed over with some residual warty tissue.  Discussed with patient about options.  He opted for retreatment today with liquid nitrogen.  R ear pressure and pain.  No drainage.  H/o TM rupture.  He had a head cold with cough, some sputum, rhinorrhea.  He can hear a cracking/popping in the R ear.   Flying to [employer].  No fevers now but likely did prev.  No vomiting, no diarrhea.  No wheeze.  Some altered hearing R ear, more sensitive to loud noises.    Meds, vitals, and allergies reviewed.   ROS: Per HPI unless specifically indicated in ROS section   Nad ncat TM without erythema bilaterally but right serous otitis media noted.  Canals and pinna within normal limits bilaterally o/w. Nasal and oropharyngeal exam within normal limits otherwise. Warty lesion noted on the right hand.  Appears smaller than previous. Frozen x3 with liq N2.  Tolerated well.

## 2018-04-02 DIAGNOSIS — H6590 Unspecified nonsuppurative otitis media, unspecified ear: Secondary | ICD-10-CM | POA: Insufficient documentation

## 2018-04-02 NOTE — Assessment & Plan Note (Signed)
Treated with liquid nitrogen x3.  No complication.  Update me as needed.  He agrees.  This may resolve the lesion.  We can retreat if desired/needed.

## 2018-04-02 NOTE — Assessment & Plan Note (Signed)
He has a plane flight in the near future.  He previously had tympanic membrane rupture during a plane flight.  Discussed options. Use prednisone with food.  Two tabs today and two prior to the flight tomorrow.  Use afrin only before the flights.   Gently try to pop your ears.  Update me as needed.  He agrees.  No indication for antibiotics.

## 2018-05-12 ENCOUNTER — Ambulatory Visit (HOSPITAL_BASED_OUTPATIENT_CLINIC_OR_DEPARTMENT_OTHER): Payer: Self-pay | Attending: Pulmonary Disease

## 2018-07-14 ENCOUNTER — Telehealth: Payer: Self-pay | Admitting: Family Medicine

## 2018-07-14 MED ORDER — OSELTAMIVIR PHOSPHATE 75 MG PO CAPS: 75.0000 mg | ORAL_CAPSULE | Freq: Two times a day (BID) | ORAL | 0 refills | Status: DC

## 2018-07-14 NOTE — Telephone Encounter (Signed)
Pt stated his son was just diagnosed with the flu and wanted to know if he can get a prescription for Tamaflu   Sent to [facility]

## 2018-07-14 NOTE — Telephone Encounter (Signed)
Patient advised.

## 2018-07-14 NOTE — Telephone Encounter (Signed)
Sent. Start with daily dosing.  If flu sx, then change to BID dosing.  I hope they all feel better.  Thanks.

## 2018-07-19 DIAGNOSIS — M25561 Pain in right knee: Secondary | ICD-10-CM | POA: Diagnosis not present

## 2018-07-19 DIAGNOSIS — M25562 Pain in left knee: Secondary | ICD-10-CM | POA: Diagnosis not present

## 2018-08-02 IMAGING — US US ABDOMEN LIMITED
1 series · 14 of 25 positions shown · non-contrast
Comparison: None.

CLINICAL DATA: Elevated LFTs

EXAM:
ULTRASOUND ABDOMEN LIMITED RIGHT UPPER QUADRANT

[Series 1: us abdomen limited · 0.20mm/px · 14 of 46 slices shown]
[im 1/46]
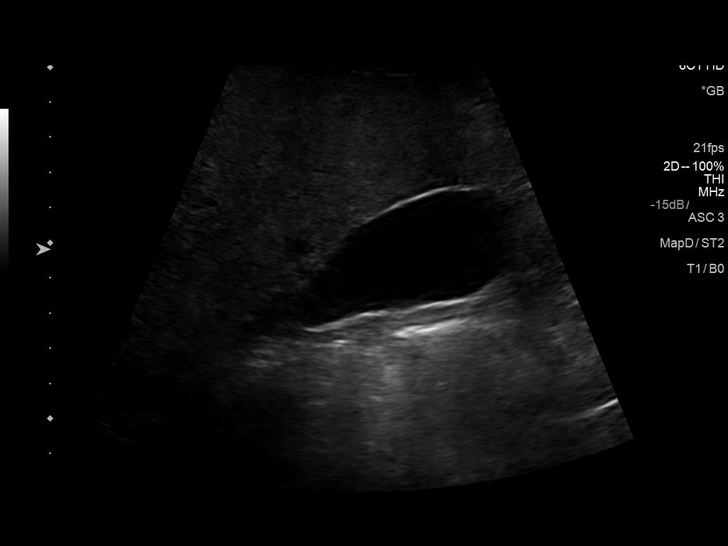
[im 4/46]
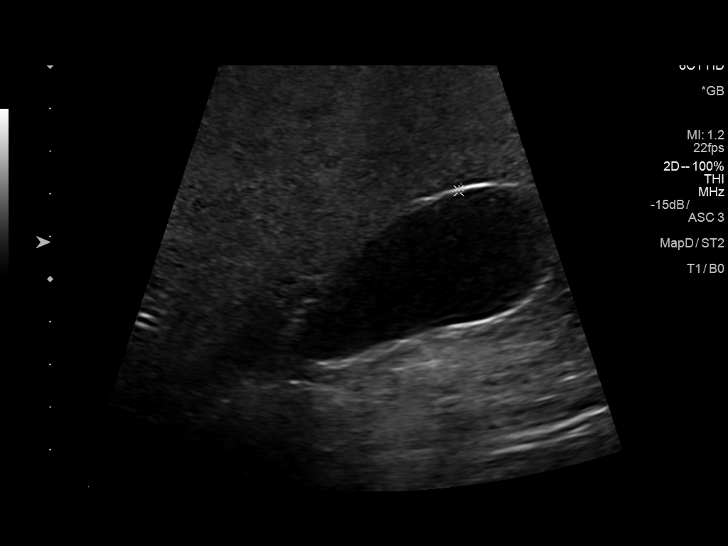
[im 8/46]
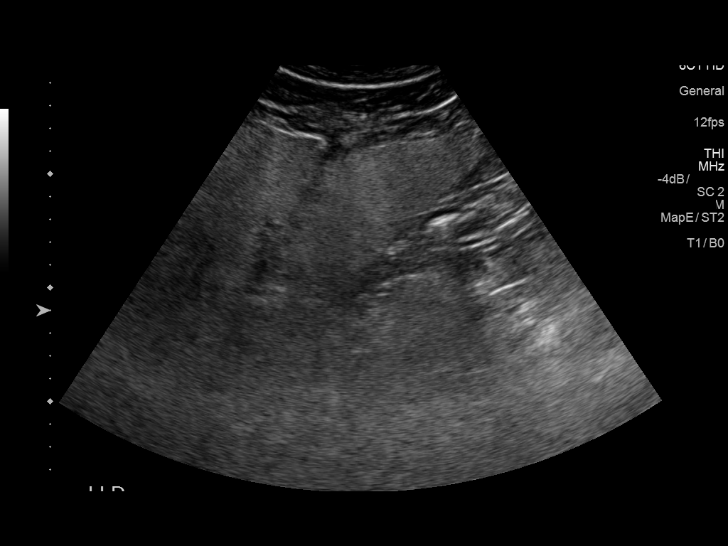
[im 12/46]
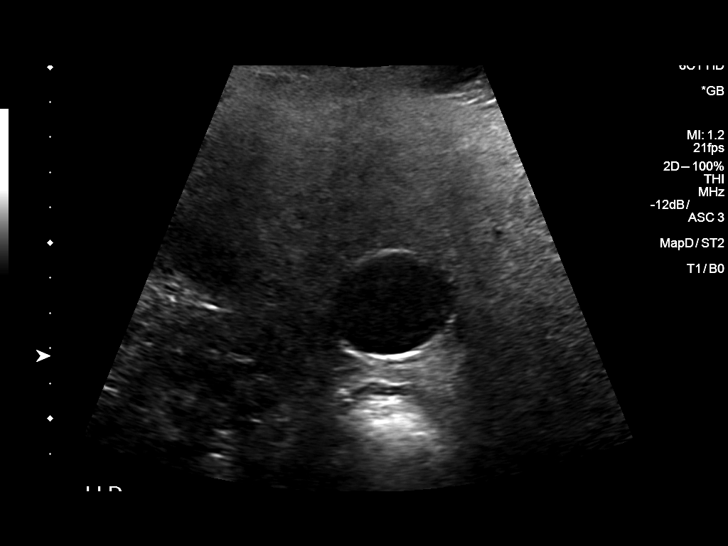
[im 16/46]
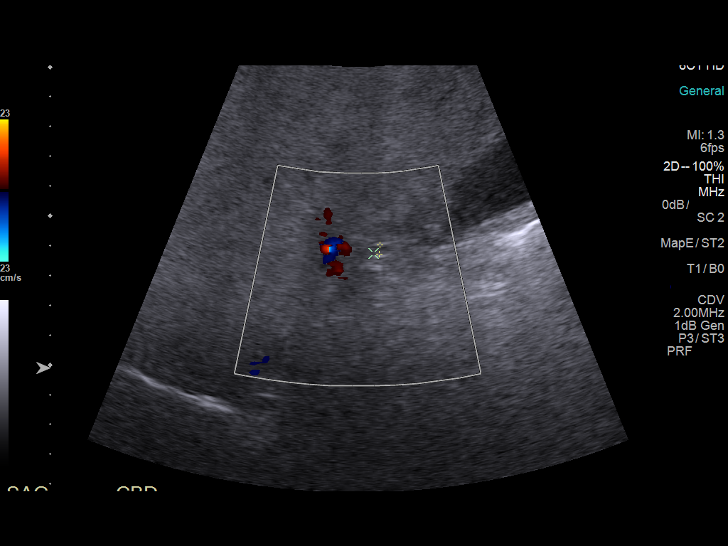
[im 17/46]
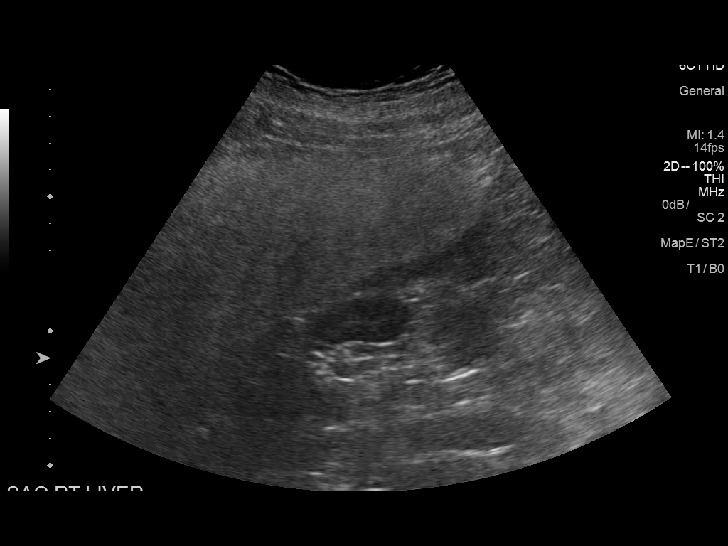
[im 21/46]
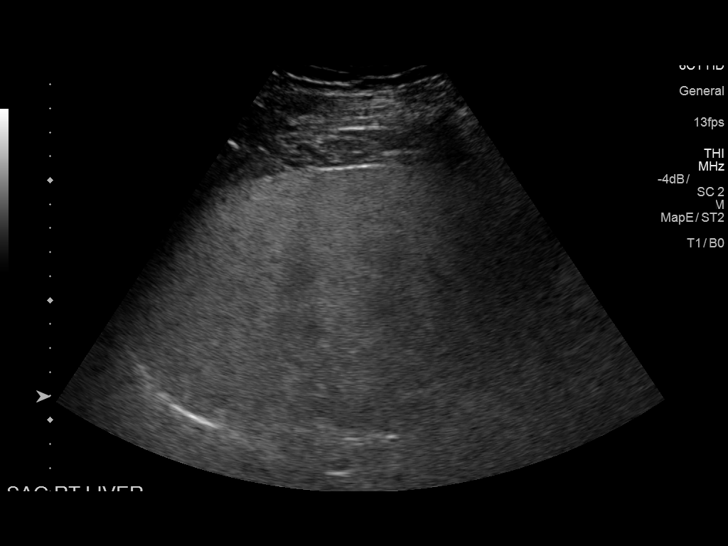
[im 25/46]
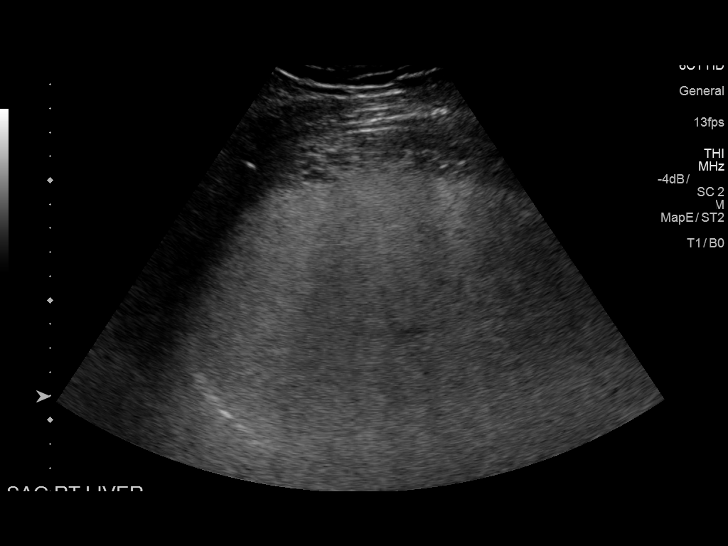
[im 29/46]
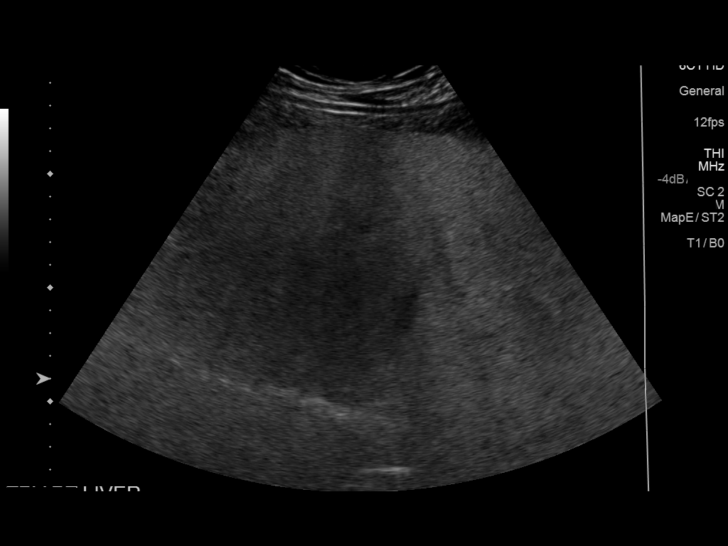
[im 31/46]
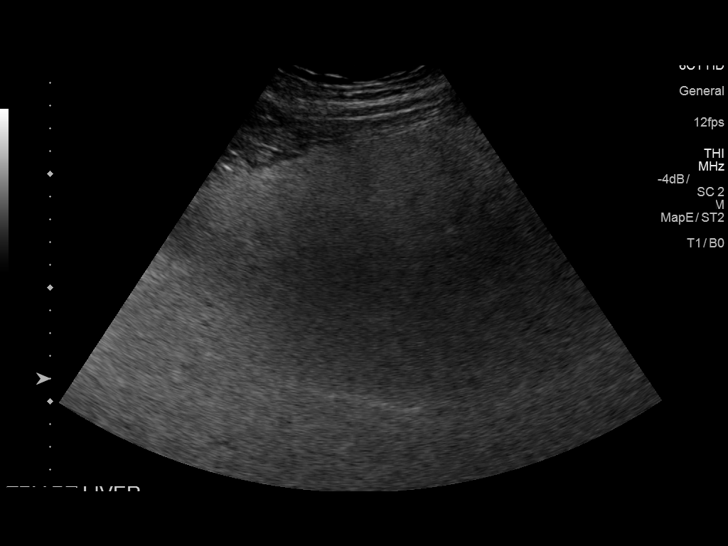
[im 34/46]
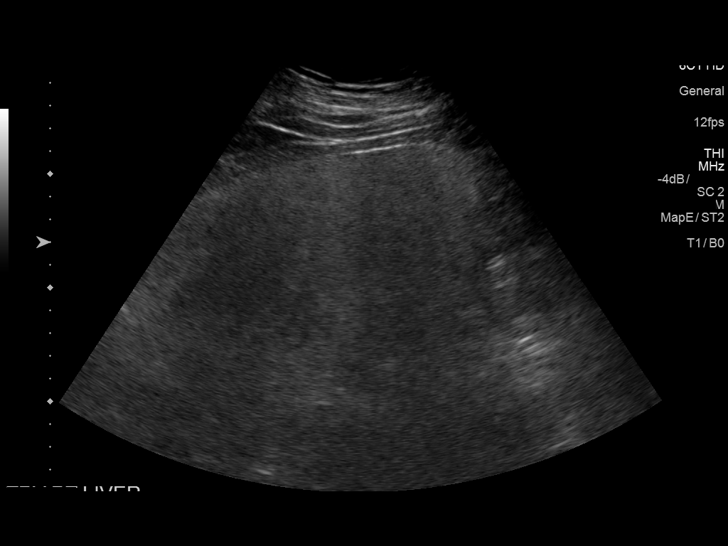
[im 38/46]
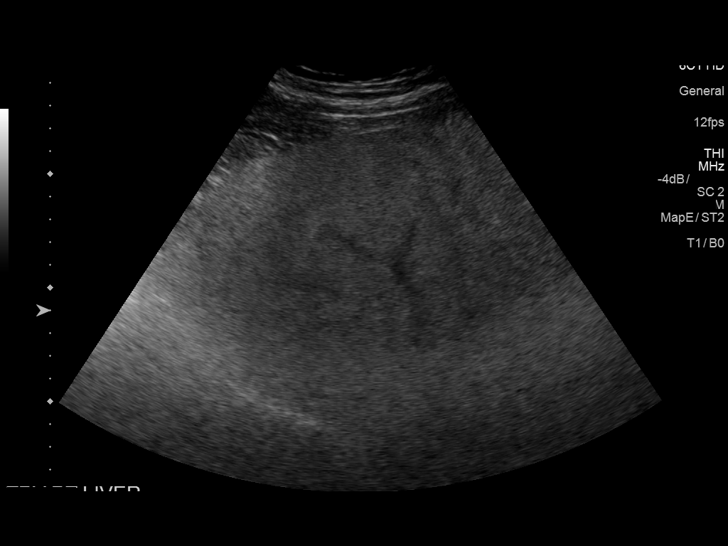
[im 42/46]
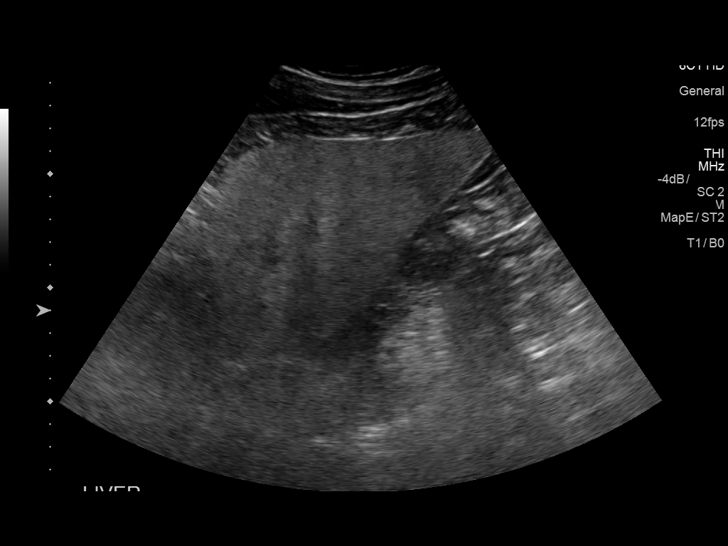
[im 46/46]
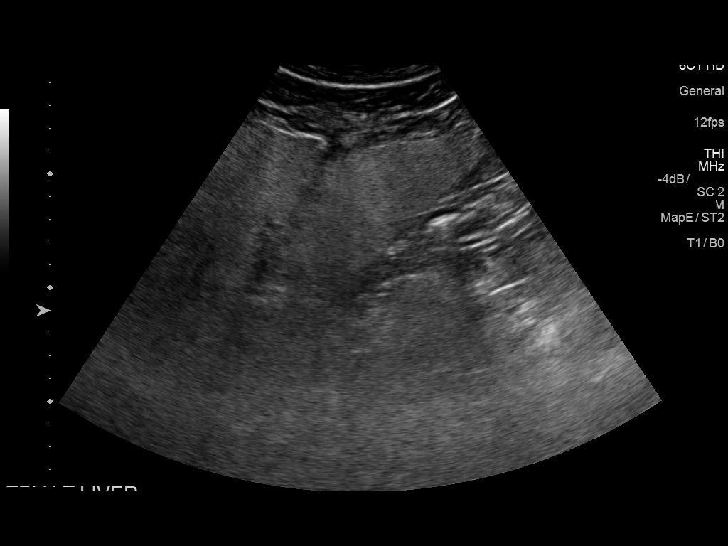

[14 of 25 positions shown; findings below may reference images not displayed]

FINDINGS: Gallbladder:

No gallstones or wall thickening visualized. No sonographic Murphy
sign noted by sonographer.

Common bile duct:

Diameter: 3 mm.

Liver:

Diffusely increased in echogenicity consistent with fatty
infiltration. No focal mass is noted. Portal vein is patent on color
Doppler imaging with normal direction of blood flow towards the
liver.
IMPRESSION: Increased echogenicity within the liver likely related fatty
infiltration.

## 2018-08-03 ENCOUNTER — Ambulatory Visit: Payer: 59 | Admitting: Family Medicine

## 2018-08-03 ENCOUNTER — Encounter: Payer: Self-pay | Admitting: Family Medicine

## 2018-08-03 VITALS — BP 128/82 | HR 79 | Temp 98.1°F | Ht 68.0 in | Wt 246.2 lb

## 2018-08-03 DIAGNOSIS — H6501 Acute serous otitis media, right ear: Secondary | ICD-10-CM

## 2018-08-03 MED ORDER — FLUTICASONE PROPIONATE 50 MCG/ACT NA SUSP: 2.0000 | Freq: Every day | NASAL | 1 refills | Status: DC

## 2018-08-03 MED ORDER — AMOXICILLIN 500 MG PO CAPS: 500.0000 mg | ORAL_CAPSULE | Freq: Three times a day (TID) | ORAL | 0 refills | Status: DC

## 2018-08-03 NOTE — Assessment & Plan Note (Signed)
Right OM  (has had problems in the past) tx with amoxicillin  When resolved-try debrox to minimize cerumen as well  flonase ns may help ETD Update if not starting to improve in a week or if worsening

## 2018-08-03 NOTE — Progress Notes (Signed)
Subjective:    Patient ID: Alejandro [name], male    DOB: March 04, 1981, 38 y.o.   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 Acute serous otitis media, right ear 08/03/2018  . SOM (secretory otitis media) 04/02/2018  . Wart 03/22/2018  . Leg pain 01/29/2018  . Shingles 09/22/2017  . 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 Fatty liver 05/29/2017  . LFT elevation 05/29/2017  . Advance care planning 05/19/2017  . Panic 05/19/2017  . Encounter for general adult medical examination with abnormal findings 09/18/2012  . Snoring 09/18/2012  . Obesity 02/02/2011  . Hyperlipidemia, mixed 09/11/2010  . Essential hypertension 08/31/2010   Past Medical History:  Diagnosis Date  . Hyperlipidemia   . Hypertension   . Obesity   . Plantar fasciitis    followed by ortho   Past Surgical History:  Procedure Laterality Date  . US ECHOCARDIOGRAPHY  08/2010   normal, no LVH   Social History   Tobacco Use  . Smoking status: Never Smoker  . Smokeless tobacco: Never Used  Substance Use Topics  . Alcohol use: No    Frequency: Never  . Drug use: No   Family History  Problem Relation Age of Onset  . Hyperlipidemia Father   . CAD Paternal Grandfather 94  . Diabetes Mother   . Hypertension Maternal Grandfather   . CAD Maternal Grandfather 30       MI, CABG  . Colon cancer Neg Hx   . Prostate cancer Neg Hx    No Known Allergies Current Outpatient Medications on File Prior to Visit  Medication Sig Dispense Refill  . escitalopram (LEXAPRO) 20 MG  tablet Take 20 mg by mouth daily.  0  . ALPRAZolam (XANAX) 0.5 MG tablet take 1 tablet by mouth three times a day if needed for anxiety  0   No current facility-administered medications on file prior to visit.     Review of Systems  Constitutional: Negative for activity change, appetite change, fatigue, fever and unexpected weight change.  HENT: Positive for ear pain and hearing loss. Negative for congestion, ear discharge, facial swelling, mouth sores, rhinorrhea, sinus pressure, sinus pain, sore throat and trouble swallowing.   Eyes: Negative for pain, redness, itching and visual disturbance.  Respiratory: Negative for cough, chest tightness, shortness of breath and wheezing.   Cardiovascular: Negative for chest pain and palpitations.  Gastrointestinal: Negative for abdominal pain, blood in stool, constipation, diarrhea and nausea.  Endocrine: Negative for cold intolerance, heat intolerance, polydipsia and polyuria.  Genitourinary: Negative for difficulty urinating, dysuria, frequency and urgency.  Musculoskeletal: Negative for arthralgias, joint swelling and myalgias.  Skin: Negative for pallor and rash.  Neurological: Negative for dizziness, tremors, weakness, numbness and headaches.  Hematological: Negative for adenopathy. Does not bruise/bleed easily.  Psychiatric/Behavioral: Negative for decreased concentration and dysphoric mood. The patient is not nervous/anxious.  Objective:   Physical Exam Constitutional:      Appearance: Normal appearance. He is obese. He is not ill-appearing.  HENT:     Head: Normocephalic and atraumatic.     Comments: No sinus tenderness     Left Ear: Tympanic membrane, ear canal and external ear normal.     Ears:     Comments: R TM is erythematous and dull (with effusion)  No rupture  Canal has partial wet cerumen impaction /no swelling or erythema     Nose: Nose normal. No congestion or rhinorrhea.     Comments: Boggy nares     Mouth/Throat:       Mouth: Mucous membranes are moist.     Pharynx: Oropharynx is clear.  Eyes:     General:        Right eye: No discharge.        Left eye: No discharge.     Extraocular Movements: Extraocular movements intact.     Conjunctiva/sclera: Conjunctivae normal.     Pupils: Pupils are equal, round, and reactive to light.  Cardiovascular:     Rate and Rhythm: Normal rate and regular rhythm.  Pulmonary:     Effort: Pulmonary effort is normal. No respiratory distress.     Breath sounds: Normal breath sounds. No wheezing or rales.  Skin:    General: Skin is dry.     Findings: No erythema or rash.  Neurological:     Mental Status: He is alert.     Cranial Nerves: No cranial nerve deficit.  Psychiatric:        Mood and Affect: Mood normal.           Assessment & Plan:   Problem List Items Addressed This Visit      Nervous and Auditory   Acute serous otitis media, right ear - Primary    Right OM  (has had problems in the past) tx with amoxicillin  When resolved-try debrox to minimize cerumen as well  flonase ns may help ETD Update if not starting to improve in a week or if worsening        Relevant Medications   amoxicillin (AMOXIL) 500 MG capsule

## 2018-08-03 NOTE — Patient Instructions (Addendum)
Take amoxicillin for ear infection  Drink fluids Ibuprofen or tylenol for pain  Start flonase nasal spray daily for at least 2 weeks   If worse/fever or no improvement   When better- get debrox ear drops over the counter and use twice weekly for ear wax in right ear  Don't use q tips

## 2018-11-02 ENCOUNTER — Ambulatory Visit: Payer: 59 | Admitting: Family Medicine

## 2018-11-02 ENCOUNTER — Other Ambulatory Visit: Payer: Self-pay

## 2018-11-02 ENCOUNTER — Encounter: Payer: Self-pay | Admitting: Family Medicine

## 2018-11-02 VITALS — BP 142/96 | HR 74 | Temp 98.2°F | Resp 18 | Ht 68.0 in | Wt 265.8 lb

## 2018-11-02 DIAGNOSIS — I10 Essential (primary) hypertension: Secondary | ICD-10-CM

## 2018-11-02 DIAGNOSIS — H6691 Otitis media, unspecified, right ear: Secondary | ICD-10-CM

## 2018-11-02 MED ORDER — AMOXICILLIN-POT CLAVULANATE 875-125 MG PO TABS: 1.0000 | ORAL_TABLET | Freq: Two times a day (BID) | ORAL | 0 refills | Status: DC

## 2018-11-02 NOTE — Assessment & Plan Note (Signed)
BP elevated. Pt noted he has not been doing great with diet/exercise. Advised home monitoring and lifestyle changes. If elevated advised pcp f/u

## 2018-11-02 NOTE — Patient Instructions (Signed)

## 2018-11-02 NOTE — Progress Notes (Signed)
Subjective:     Alejandro [name] is a 38 y.o. male presenting for Ear Pain (right ear pain. Right side of neck is sore. Slight chills and body aches. No fever. Was treated for ear infection in February and does not feel like it improved all the way on the medicine then)     Otalgia   There is pain in the right ear. This is a new problem. The current episode started in the past 7 days. The problem occurs constantly. The problem has been gradually worsening. There has been no fever. Associated symptoms include neck pain. Pertinent negatives include no coughing, diarrhea, ear discharge, headaches, rhinorrhea, sore throat or vomiting. He has tried NSAIDs for the symptoms. The treatment provided mild relief. hx of ruptured ear drum on right     Review of Systems  HENT: Positive for ear pain. Negative for ear discharge, rhinorrhea and sore throat.   Respiratory: Negative for cough.   Gastrointestinal: Negative for diarrhea and vomiting.  Musculoskeletal: Positive for arthralgias, myalgias and neck pain.  Neurological: Negative for headaches.     Social History   Tobacco Use  Smoking Status Never Smoker  Smokeless Tobacco Never Used        Objective:    BP Readings from Last 3 Encounters:  11/02/18 (!) 142/96  08/03/18 128/82  03/30/18 128/70   Wt Readings from Last 3 Encounters:  11/02/18 265 lb 12 oz (120.5 kg)  08/03/18 246 lb 4 oz (111.7 kg)  03/30/18 253 lb 4 oz (114.9 kg)    BP (!) 142/96   Pulse 74   Temp 98.2 F (36.8 C)   Resp 18   Ht 5\' 8"  (1.727 m)   Wt 265 lb 12 oz (120.5 kg)   BMI 40.41 kg/m    Physical Exam Constitutional:      General: He is not in acute distress.    Appearance: He is well-developed. He is not ill-appearing.  HENT:     Head: Normocephalic and atraumatic.     Right Ear: Ear canal normal. Tenderness present. A middle ear effusion is present. Tympanic membrane is erythematous and bulging.     Left Ear: Tympanic membrane and ear  canal normal.     Nose: Mucosal edema and rhinorrhea present.     Right Sinus: No maxillary sinus tenderness or frontal sinus tenderness.     Left Sinus: No maxillary sinus tenderness or frontal sinus tenderness.     Mouth/Throat:     Pharynx: Uvula midline. Posterior oropharyngeal erythema present. No oropharyngeal exudate.     Tonsils: 0 on the right. 0 on the left.  Neck:     Musculoskeletal: Neck supple.  Cardiovascular:     Rate and Rhythm: Normal rate and regular rhythm.     Heart sounds: No murmur.  Pulmonary:     Effort: Pulmonary effort is normal. No respiratory distress.     Breath sounds: Normal breath sounds.  Lymphadenopathy:     Cervical: No cervical adenopathy.  Skin:    General: Skin is warm and dry.     Capillary Refill: Capillary refill takes less than 2 seconds.  Neurological:     Mental Status: He is alert.           Assessment & Plan:   Problem List Items Addressed This Visit      Cardiovascular and Mediastinum   Essential hypertension    BP elevated. Pt noted he has not been doing great with diet/exercise. Advised home  monitoring and lifestyle changes. If elevated advised pcp f/u       Other Visit Diagnoses    Acute otitis media, right    -  Primary   Relevant Medications   amoxicillin-clavulanate (AUGMENTIN) 875-125 MG tablet       Return if symptoms worsen or fail to improve.  Lesleigh Noe, MD

## 2019-01-23 ENCOUNTER — Ambulatory Visit: Payer: 59 | Admitting: Family Medicine

## 2019-01-23 ENCOUNTER — Encounter: Payer: Self-pay | Admitting: Family Medicine

## 2019-01-23 ENCOUNTER — Other Ambulatory Visit: Payer: Self-pay

## 2019-01-23 DIAGNOSIS — H6590 Unspecified nonsuppurative otitis media, unspecified ear: Secondary | ICD-10-CM | POA: Diagnosis not present

## 2019-01-23 MED ORDER — FLUTICASONE PROPIONATE 50 MCG/ACT NA SUSP: 2.0000 | Freq: Every day | NASAL | 5 refills | Status: DC

## 2019-01-23 MED ORDER — AMOXICILLIN-POT CLAVULANATE 875-125 MG PO TABS: 1.0000 | ORAL_TABLET | Freq: Two times a day (BID) | ORAL | 0 refills | Status: DC

## 2019-01-23 NOTE — Progress Notes (Signed)
Ear pain, right.  No FCNAVD.  Less pain today but more in the last week.  H/o R TM perf, prev treated.  No L ear pain.  No facial pain.   Pandemic considerations d/w pt.   Meds, vitals, and allergies reviewed.   ROS: Per HPI unless specifically indicated in ROS section   nad ncat Minimally pink R TM with clear fluid noted behind TM.  L TM wnl Bilateral canals normal. Mastoids not tender. Neck supple no lymphadenopathy.

## 2019-01-23 NOTE — Patient Instructions (Addendum)
Restart flonase and if not better (or if worse) then start augmentin.  Take care.  Glad to see you.

## 2019-01-24 DIAGNOSIS — H6590 Unspecified nonsuppurative otitis media, unspecified ear: Secondary | ICD-10-CM | POA: Insufficient documentation

## 2019-01-24 NOTE — Assessment & Plan Note (Signed)
He does not have florid acute otitis changes.  Discussed options.  He does have some clear fluid behind the eardrum. Restart flonase and if not better (or if worse) then start augmentin.  He agrees.  Update me as needed.

## 2019-01-26 ENCOUNTER — Other Ambulatory Visit: Payer: Self-pay

## 2019-01-26 ENCOUNTER — Telehealth: Payer: Self-pay | Admitting: Family Medicine

## 2019-01-26 DIAGNOSIS — Z20822 Contact with and (suspected) exposure to covid-19: Secondary | ICD-10-CM

## 2019-01-26 DIAGNOSIS — Z20828 Contact with and (suspected) exposure to other viral communicable diseases: Secondary | ICD-10-CM

## 2019-01-26 NOTE — Telephone Encounter (Signed)
Please arrange for testing.  I put in the order.  Thanks.

## 2019-01-26 NOTE — Telephone Encounter (Signed)
Patient advised. Patient will go ahead and go to the [employer] testing site. Advised patient on what to do and to quarantine until we have results. Also patient will let us know if he needs a note for work to be out.

## 2019-01-26 NOTE — Telephone Encounter (Signed)
Patient stated that he has a chief that comes and does his weekly meals.  He stated that he hasn't seen his chief since Sunday and he dropped the food of his the patient's car so no real interaction with each other.  Patient stated that someone in the chiefs family has tested positive for COVID.  The Chief has not had any symptoms.   Patient was just in the office for an ear infection and stated that he has been feeling very tired lately. But nothing else. 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  He wants to know if you think he should be tested.   C/B # [phone]

## 2019-01-28 LAB — NOVEL CORONAVIRUS, NAA: SARS-CoV-2, NAA: NOT DETECTED

## 2019-02-20 ENCOUNTER — Other Ambulatory Visit: Payer: Self-pay

## 2019-02-20 DIAGNOSIS — Z20822 Contact with and (suspected) exposure to covid-19: Secondary | ICD-10-CM

## 2019-02-22 LAB — NOVEL CORONAVIRUS, NAA: SARS-CoV-2, NAA: NOT DETECTED

## 2019-03-16 ENCOUNTER — Other Ambulatory Visit: Payer: Self-pay

## 2019-03-16 ENCOUNTER — Ambulatory Visit (INDEPENDENT_AMBULATORY_CARE_PROVIDER_SITE_OTHER): Payer: 59

## 2019-03-16 DIAGNOSIS — Z23 Encounter for immunization: Secondary | ICD-10-CM | POA: Diagnosis not present

## 2019-04-02 ENCOUNTER — Other Ambulatory Visit: Payer: Self-pay

## 2019-04-02 DIAGNOSIS — Z20822 Contact with and (suspected) exposure to covid-19: Secondary | ICD-10-CM

## 2019-04-04 LAB — NOVEL CORONAVIRUS, NAA: SARS-CoV-2, NAA: NOT DETECTED

## 2019-04-09 ENCOUNTER — Encounter: Payer: Self-pay | Admitting: Bariatrics

## 2019-04-09 ENCOUNTER — Other Ambulatory Visit: Payer: Self-pay

## 2019-04-09 ENCOUNTER — Encounter (INDEPENDENT_AMBULATORY_CARE_PROVIDER_SITE_OTHER): Payer: Self-pay | Admitting: Bariatrics

## 2019-04-09 ENCOUNTER — Ambulatory Visit (INDEPENDENT_AMBULATORY_CARE_PROVIDER_SITE_OTHER): Payer: 59 | Admitting: Bariatrics

## 2019-04-09 VITALS — BP 145/84 | HR 80 | Temp 98.3°F | Ht 68.0 in | Wt 254.0 lb

## 2019-04-09 DIAGNOSIS — K760 Fatty (change of) liver, not elsewhere classified: Secondary | ICD-10-CM | POA: Diagnosis not present

## 2019-04-09 DIAGNOSIS — F3289 Other specified depressive episodes: Secondary | ICD-10-CM | POA: Diagnosis not present

## 2019-04-09 DIAGNOSIS — G4733 Obstructive sleep apnea (adult) (pediatric): Secondary | ICD-10-CM | POA: Diagnosis not present

## 2019-04-09 DIAGNOSIS — Z9189 Other specified personal risk factors, not elsewhere classified: Secondary | ICD-10-CM | POA: Diagnosis not present

## 2019-04-09 DIAGNOSIS — I10 Essential (primary) hypertension: Secondary | ICD-10-CM | POA: Diagnosis not present

## 2019-04-09 DIAGNOSIS — E7849 Other hyperlipidemia: Secondary | ICD-10-CM

## 2019-04-09 DIAGNOSIS — R0602 Shortness of breath: Secondary | ICD-10-CM

## 2019-04-09 DIAGNOSIS — R5383 Other fatigue: Secondary | ICD-10-CM | POA: Diagnosis not present

## 2019-04-09 DIAGNOSIS — Z6838 Body mass index (BMI) 38.0-38.9, adult: Secondary | ICD-10-CM

## 2019-04-09 DIAGNOSIS — Z0289 Encounter for other administrative examinations: Secondary | ICD-10-CM

## 2019-04-09 NOTE — Progress Notes (Signed)
.  Office: [phone]  /  Fax: [phone]   HPI:   Chief Complaint: OBESITY  Alejandro [name] (MR# [id number]) is a 38 y.o. male who presents on 04/09/2019 for obesity evaluation and treatment. Current BMI is Body mass index is 38.62 kg/m.  Jairus has struggled with obesity for years and has been unsuccessful in either losing weight or maintaining long term weight loss. Kenderrick attended our information session and states he is currently in the action stage of change and ready to dedicate time achieving and maintaining a healthier weight.   Emillio craves chips, cookies, pizza, and other snacks or carbs. He dislikes beans and beets. Andrey is surrounded by junk food.  Hani states that he struggles with family and or coworkers weight loss sabotage his desired weight loss is 64 lbs  he started gaining weight around age 72 his heaviest weight ever was 260 lbs he has significant food cravings issues  he snacks frequently in the evenings he skips breakfast sometimes he is frequently drinking liquids with calories he frequently makes poor food choices he has problems with excessive hunger  he frequently eats larger portions than normal  he has binge eating behaviors he struggles with emotional eating    Fatigue Dannie feels his energy is lower than it should be. This has worsened with weight gain and has not worsened recently. Gaetan admits to daytime somnolence and admits to waking up still tired. Patient is at risk for obstructive sleep apnea. Patient has a history of symptoms of morning headache and hypertension. Patient generally gets 9 hours of sleep per night, and states they generally have generally restful sleep. Snoring is present. Apneic episodes is present. Epworth Sleepiness Score is 7.  Dyspnea on exertion Leeum notes increasing shortness of breath with certain activities and seems to be worsening over time with weight gain. He notes getting out of breath sooner with  activity than he used to. This has not gotten worse recently.    Hypertension DEPAUL JACKOVICH is a 37 y.o. male with hypertension. Yichen's blood pressure is reasonably well controlled and he has been on medications in the past. He is working on weight loss to help control his blood pressure with the goal of decreasing his risk of heart attack and stroke.   Hyperlipidemia Ari has hyperlipidemia and is not taking any medications. He has been trying to improve his cholesterol levels with intensive lifestyle modification including a low saturated fat diet, exercise, and weight loss.   At risk for cardiovascular disease Harfateh is at a higher than average risk for cardiovascular disease due to hypertension, hyperlipidemia, and obesity.   Fatty Liver with Elevated Liver Function Tests Rathana had an ultrasound on 12/18 and the findings were consistent with a fatty liver.  Obstructive Sleep Apnea Kemauri does not wear a CPAP. He had a sleep study that needs additional testing and fitting.  Depression Screen Burney's Food and Mood (modified PHQ-9) score was 22. 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 His PHQ-9 was 22. He is  struggling with emotional eating and using food for comfort to the extent that it is negatively impacting his health. He often snacks when he is not hungry. Laryan sometimes feels he is out of control and then feels guilty that he made poor food choices. He has been working on behavior modification techniques to help reduce his  emotional eating and has been somewhat successful. He shows no sign of suicidal or homicidal ideations.  ASSESSMENT AND PLAN:  Other fatigue - Plan: EKG 12-Lead, Hemoglobin A1c, Insulin, random, VITAMIN D 25 Hydroxy (Vit-D Deficiency, Fractures), T3, T4, free, TSH  Shortness of breath on exertion  Fatty liver  Essential hypertension - Plan: Comprehensive metabolic panel  Other depression - with emotional eating  OSA (obstructive sleep apnea)  Other hyperlipidemia - Plan: Lipid Panel With LDL/HDL Ratio  At risk for heart disease  Class 2 severe obesity with serious comorbidity and body mass index (BMI) of 38.0 to 38.9 in adult, unspecified obesity type (HCC)  PLAN:  Fatigue Kary was informed that his fatigue may be related to obesity, depression, or many other causes. Labs will be ordered, and in the meanwhile, April has agreed to work on diet, exercise and weight loss to help with fatigue. Proper sleep hygiene was discussed including the need for 7-8 hours of quality sleep each night. A sleep study was not ordered based on symptoms and Epworth score. An EKG and an indirect calorimetry was ordered today. Elius agrees to follow up in 2 weeks.  Dyspnea on exertion Navjot's shortness of breath appears to be obesity related and exercise induced. He has agreed to work on weight loss and gradually increase activities to treat his exercise induced shortness of breath. If Enki follows our instructions and loses weight without improvement of his shortness of breath, we will plan to refer to pulmonology. We will monitor this condition regularly. Janssen agrees to this plan.  Hypertension We discussed sodium restriction, working on healthy weight loss, and a regular exercise program as the means to achieve improved blood pressure control. We will continue to monitor his blood pressure as well as his progress with the above lifestyle modifications. He will continue his medications as  prescribed and will watch for signs of hypotension as he continues his lifestyle modifications. Bezalel agrees to follow up with his PCP and he will decrease his sodium intake. Shloimy agreed with this plan and agreed to follow up as directed.  Hyperlipidemia Kuba was informed of the American Heart Association Guidelines emphasizing intensive lifestyle modifications as the first line treatment for hyperlipidemia. We discussed many lifestyle modifications today in depth, and Maclane will continue to work on decreasing saturated fats such as fatty red meat, butter and many fried foods. He will also increase vegetables and lean protein in his diet and continue to work on exercise and weight loss efforts. Rasheim agrees to reduce saturated fats, monounsaturated fatty acids, and polyunsaturated fatty acids in his diet. He agrees to follow up in 2 weeks.  Cardiovascular risk counseling Kweku was given extended (15 minutes) coronary artery disease prevention counseling today. He is 38 y.o. male and has risk factors for heart disease including hypertension, hyperlipidemia, and obesity. We discussed intensive lifestyle modifications today with an emphasis on specific weight loss instructions and strategies. Pt was also informed of the importance of increasing exercise and decreasing saturated fats to help prevent heart disease.  Fatty Liver with Elevated Liver Function Tests Velma agrees to decrease his weight by 5 to 10% and he will  do cardio and resistance exercises to 150 minutes per week. He agrees to follow up at the agreed upon time.  Obstructive Sleep Apnea Othell agrees to call for the CPAP fittings and he will follow up in our office in 2 weeks.  Depression Screen Darroll had a strongly positive depression screening. Depression is commonly associated with obesity and often results in emotional eating behaviors. We will monitor this closely and work on CBT to help improve the non-hunger eating  patterns. Referral to Psychology may be required if no improvement is seen as he continues in our clinic.  Depression with Emotional Eating Behaviors We discussed behavior modification techniques today to help Arslan deal with his emotional eating and depression. Patient was referred to Dr. Mallie Mussel, our bariatric psychologist for evaluation due to elevated PHQ-9 score and significant struggles with emotional eating. Elmar will follow up at the agreed upon time in 2 weeks.  Obesity Kehinde is currently in the action stage of change and his goal is to continue with weight loss efforts. He has agreed to follow the Category 4 plan + 200 calories. Coolidge has been instructed to work up to a goal of 150 minutes of combined cardio and strengthening exercise per week for weight loss and overall health benefits. We discussed the following Behavioral Modification Strategies today: increasing lean protein intake, decreasing simple carbohydrates, increasing vegetables, increase H2O intake, decrease eating out, no skipping meals, keeping healthy foods in the home, planning for success, and work on meal planning and easy cooking plans. Aeson agrees to start meal planning with intentional eating and to stop all soda.  Cordelro has agreed to follow up with our clinic in 2 weeks. He was informed of the importance of frequent follow up visits to maximize his success with intensive lifestyle modifications for his multiple health conditions. He was informed we would discuss his lab results at his next visit unless there is a critical issue that needs to be addressed sooner. Nikhil agreed to keep his next visit at the agreed upon time to discuss these results.  ALLERGIES: No Known Allergies  MEDICATIONS: Current Outpatient Medications on File Prior to Visit  Medication Sig Dispense Refill  . escitalopram (LEXAPRO) 20 MG tablet Take 20 mg by mouth daily.  0   No current facility-administered medications on file  prior to visit.     PAST MEDICAL HISTORY: Past Medical History:  Diagnosis Date  . ADD (attention deficit disorder)   . Alcohol abuse   . Anxiety   . Back pain   . Bursitis of both knees   . Degenerative disc disease, lumbar   . Depression   . Drug use   . Fatty liver   . Floppy eyelid syndrome   . GERD (gastroesophageal reflux disease)   . Hyperlipidemia   . Hypertension   . Obesity   . Plantar fasciitis    followed by ortho  . Sleep apnea   . SOB (shortness of breath)     PAST SURGICAL HISTORY: Past Surgical History:  Procedure Laterality Date  . US ECHOCARDIOGRAPHY  08/2010   normal, no LVH    SOCIAL HISTORY: Social History   Tobacco Use  . Smoking status: Never Smoker  . Smokeless tobacco: Never Used  Substance Use Topics  . Alcohol use: No    Frequency: Never  . Drug use: No    FAMILY HISTORY: Family History  Problem Relation Age of Onset  . Hyperlipidemia Father   . Anxiety disorder Father   .  CAD Paternal Grandfather 32  . Diabetes Mother   . Depression Mother   . Sleep apnea Mother   . Alcoholism Mother   . Obesity Mother   . Hypertension Maternal Grandfather   . CAD Maternal Grandfather 30       MI, CABG  . Colon cancer Neg Hx   . Prostate cancer Neg Hx     ROS: Review of Systems  Constitutional: Positive for malaise/fatigue.  HENT: Positive for ear pain.   Eyes: Positive for pain and redness.       Wears glasses or contacts.  Respiratory: Positive for shortness of breath.   Cardiovascular: Positive for chest pain.  Musculoskeletal: Positive for back pain.  Neurological: Positive for headaches.  Psychiatric/Behavioral: Positive for depression.    PHYSICAL EXAM: Blood pressure (!) 145/84, pulse 80, temperature 98.3 F (36.8 C), temperature source Oral, height 5\' 8"  (1.727 m), weight 254 lb (115.2 kg), SpO2 96 %. Body mass index is 38.62 kg/m. Physical Exam Vitals signs reviewed.  Constitutional:      Appearance: Normal  appearance. He is obese.  HENT:     Head: Normocephalic and atraumatic.     Nose: Nose normal.  Eyes:     General: No scleral icterus.    Extraocular Movements: Extraocular movements intact.  Neck:     Musculoskeletal: Normal range of motion and neck supple.     Thyroid: No thyromegaly.     Comments: Negative for thyromegaly. Cardiovascular:     Rate and Rhythm: Normal rate and regular rhythm.  Pulmonary:     Effort: Pulmonary effort is normal. No respiratory distress.  Abdominal:     Palpations: Abdomen is soft.     Tenderness: There is no abdominal tenderness.     Comments: Positive for obesity.  Musculoskeletal:     Comments: ROM normal in all extremities.  Skin:    General: Skin is warm and dry.  Neurological:     Mental Status: He is alert and oriented to person, place, and time.     Coordination: Coordination normal.  Psychiatric:        Mood and Affect: Mood normal.        Behavior: Behavior normal.     RECENT LABS AND TESTS: BMET    Component Value Date/Time   NA 136 05/18/2017 1804   K 3.9 05/18/2017 1804   CL 99 05/18/2017 1804   CO2 26 05/18/2017 1804   GLUCOSE 77 05/18/2017 1804   BUN 17 05/18/2017 1804   CREATININE 0.94 05/18/2017 1804   CALCIUM 9.8 05/18/2017 1804   No results found for: HGBA1C No results found for: INSULIN CBC No results found for: WBC, RBC, HGB, HCT, PLT, MCV, MCH, MCHC, RDW, LYMPHSABS, MONOABS, EOSABS, BASOSABS Iron/TIBC/Ferritin/ %Sat No results found for: IRON, TIBC, FERRITIN, IRONPCTSAT Lipid Panel     Component Value Date/Time   CHOL 322 (H) 08/26/2017 1449   TRIG 294.0 (H) 08/26/2017 1449   HDL 51.10 08/26/2017 1449   CHOLHDL 6 08/26/2017 1449   VLDL 58.8 (H) 08/26/2017 1449   LDLCALC 192 08/28/2010 1833   LDLDIRECT 224.0 08/26/2017 1449   Hepatic Function Panel     Component Value Date/Time   PROT 7.6 08/26/2017 1449   ALBUMIN 4.8 08/26/2017 1449   AST 19 08/26/2017 1449   ALT 37 08/26/2017 1449   ALKPHOS 59  08/26/2017 1449   BILITOT 0.6 08/26/2017 1449   BILIDIR 0.0 08/26/2017 1449      Component Value Date/Time  TSH 2.23 08/31/2010 1316   ECG  shows NSR with a rate of 77 BPM. INDIRECT CALORIMETER done today shows a VO2 of 393 and a REE of 2735. His calculated basal metabolic rate is [id number] thus his basal metabolic rate is better than expected.  OBESITY BEHAVIORAL INTERVENTION VISIT  Today's visit was # 1  Starting weight: 254 lbs Starting date: 04/09/2019 Today's weight : Weight: 254 lb (115.2 kg)  Today's date: 04/09/2019 Total lbs lost to date: 0    04/09/2019  Height 5\' 8"  (1.727 m)  Weight 254 lb (115.2 kg)  BMI (Calculated) 38.63  BLOOD PRESSURE - SYSTOLIC [id number]  BLOOD PRESSURE - DIASTOLIC 84  Waist Measurement  44 inches   Body Fat % 31.5 %  Total Body Water (lbs) 122.4 lbs  RMR 2735   ASK: We discussed the diagnosis of obesity with Duke Salvia today and [name] agreed to give [place] permission to discuss obesity behavioral modification therapy today.  ASSESS: Timotheus has the diagnosis of obesity and his BMI today is 38.63. Auther is in the action stage of change.   ADVISE: Rayvon was educated on the multiple health risks of obesity as well as the benefit of weight loss to improve his health. He was advised of the need for long term treatment and the importance of lifestyle modifications to improve his current health and to decrease his risk of future health problems.  AGREE: Multiple dietary modification options and treatment options were discussed and Goble agreed to follow the recommendations documented in the above note.  ARRANGE: Kassen was educated on the importance of frequent visits to treat obesity as outlined per CMS and USPSTF guidelines and agreed to schedule his next follow up appointment today.   I, Marcille Blanco, CMA, am acting as Location manager for [employer]. Owens Shark, DO    I have reviewed the above documentation for accuracy and completeness,  and I agree with the above. -Jearld Lesch, DO

## 2019-04-10 ENCOUNTER — Encounter (INDEPENDENT_AMBULATORY_CARE_PROVIDER_SITE_OTHER): Payer: Self-pay | Admitting: Bariatrics

## 2019-04-10 DIAGNOSIS — E781 Pure hyperglyceridemia: Secondary | ICD-10-CM | POA: Insufficient documentation

## 2019-04-10 DIAGNOSIS — E559 Vitamin D deficiency, unspecified: Secondary | ICD-10-CM | POA: Insufficient documentation

## 2019-04-10 DIAGNOSIS — R7303 Prediabetes: Secondary | ICD-10-CM | POA: Insufficient documentation

## 2019-04-10 LAB — COMPREHENSIVE METABOLIC PANEL
ALT: 66 IU/L — ABNORMAL HIGH (ref 0–44)
AST: 36 IU/L (ref 0–40)
Albumin/Globulin Ratio: 2 (ref 1.2–2.2)
Albumin: 4.7 g/dL (ref 4.0–5.0)
Alkaline Phosphatase: 65 IU/L (ref 39–117)
BUN/Creatinine Ratio: 15 (ref 9–20)
BUN: 13 mg/dL (ref 6–20)
Bilirubin Total: 0.3 mg/dL (ref 0.0–1.2)
CO2: 25 mmol/L (ref 20–29)
Calcium: 9.7 mg/dL (ref 8.7–10.2)
Chloride: 99 mmol/L (ref 96–106)
Creatinine, Ser: 0.86 mg/dL (ref 0.76–1.27)
GFR calc Af Amer: 126 mL/min/{1.73_m2} (ref >59)
GFR calc non Af Amer: 109 mL/min/{1.73_m2} (ref >59)
Globulin, Total: 2.3 g/dL (ref 1.5–4.5)
Glucose: 95 mg/dL (ref 65–99)
Potassium: 4.8 mmol/L (ref 3.5–5.2)
Sodium: 138 mmol/L (ref 134–144)
Total Protein: 7 g/dL (ref 6.0–8.5)

## 2019-04-10 LAB — LIPID PANEL WITH LDL/HDL RATIO
Cholesterol, Total: 302 mg/dL — ABNORMAL HIGH (ref 100–199)
HDL: 49 mg/dL (ref >39)
LDL Chol Calc (NIH): 169 mg/dL — ABNORMAL HIGH (ref 0–99)
LDL/HDL Ratio: 3.4 ratio (ref 0.0–3.6)
Triglycerides: 433 mg/dL — ABNORMAL HIGH (ref 0–149)
VLDL Cholesterol Cal: 84 mg/dL — ABNORMAL HIGH (ref 5–40)

## 2019-04-10 LAB — VITAMIN D 25 HYDROXY (VIT D DEFICIENCY, FRACTURES): Vit D, 25-Hydroxy: 20.1 ng/mL — ABNORMAL LOW (ref 30.0–100.0)

## 2019-04-10 LAB — T3: T3, Total: 138 ng/dL (ref 71–180)

## 2019-04-10 LAB — INSULIN, RANDOM: INSULIN: 26.6 u[IU]/mL — ABNORMAL HIGH (ref 2.6–24.9)

## 2019-04-10 LAB — HEMOGLOBIN A1C
Est. average glucose Bld gHb Est-mCnc: 117 mg/dL
Hgb A1c MFr Bld: 5.7 % — ABNORMAL HIGH (ref 4.8–5.6)

## 2019-04-10 LAB — TSH: TSH: 1.83 u[IU]/mL (ref 0.450–4.500)

## 2019-04-10 LAB — T4, FREE: Free T4: 1.07 ng/dL (ref 0.82–1.77)

## 2019-04-11 ENCOUNTER — Ambulatory Visit (INDEPENDENT_AMBULATORY_CARE_PROVIDER_SITE_OTHER): Payer: 59 | Admitting: Psychology

## 2019-04-11 ENCOUNTER — Other Ambulatory Visit: Payer: Self-pay

## 2019-04-11 DIAGNOSIS — F1099 Alcohol use, unspecified with unspecified alcohol-induced disorder: Secondary | ICD-10-CM

## 2019-04-11 DIAGNOSIS — F3289 Other specified depressive episodes: Secondary | ICD-10-CM | POA: Diagnosis not present
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
## 2019-04-18 NOTE — Progress Notes (Addendum)
Office: [phone]  /  Fax: [phone]    Date: April 25, 2019   Appointment Start Time: 9:35am Duration: 26 minutes Provider: Glennie Isle, Psy.D. Type of Session: Individual Therapy  Location of Patient: Parked in car at work Location of Provider: [facility] Type of Contact: Telepsychological Visit via [employer]   Session Content: Of note, this provider called Alejandro [name] at 9:33am as he did not present for the WebEx appointment. He shared he "lost track of time." The e-mail with the secure link was re-sent. As such, today's appointment was initiated 5 minutes late.  Alejandro [name] is a 38 y.o. male presenting via [facility] [place] for a follow-up appointment to address the previously established treatment goal of decreasing emotional eating. Today's appointment was a telepsychological visit, as it is an option for appointments to reduce exposure to COVID-19. [name] expressed understanding regarding the rationale for telepsychological services, and provided verbal consent for today's appointment. Prior to proceeding with today's appointment, Alejandro [name]'s physical location at the time of this appointment was obtained. In the event of technical difficulties, Duke shared a phone number he could be reached at. Alejandro [name] and this provider participated in today's telepsychological service. Also, [name] denied anyone else being present in the car or on the WebEx appointment.  This provider conducted a brief check-in and verbally administered the PHQ-9 and GAD-7. Alejandro [name] shared, "Everything is good." He reported, "The program is going well. I lost 6 pounds." Of note, this provider recommended he call [name] to schedule an appointment per the referral placed, as he has received two calls. Alejandro [name] was agreeable. Session then focused on emotional eating. Alejandro [name] shared about recent episodes of emotional eating. Psychoeducation regarding triggers for emotional eating was  provided. Alejandro [name] was provided a handout, and encouraged to utilize the handout between now and the next appointment to increase awareness of triggers and frequency. Alejandro [name] agreed. This provider also discussed behavioral strategies for specific triggers, such as placing the utensil down when conversing to avoid mindless eating. Alejandro [name] provided verbal consent during today's appointment for this provider to send the handout about triggers via e-mail. Alejandro [name] was receptive to today's session as evidenced by openness to sharing, responsiveness to feedback, and willingness to explore triggers for emotional eating.  Mental Status Examination:  Appearance: neat Behavior: cooperative Mood: euthymic Affect: mood congruent Speech: normal in rate, volume, and tone Eye Contact: appropriate Psychomotor Activity: appropriate Thought Process: linear, logical, and goal directed  Content/Perceptual Disturbances: no hallucinations, delusions, bizarre thinking or behavior reported or observed and no evidence of suicidal and homicidal ideation, plan, and intent Orientation: time, person, place and purpose of appointment Cognition/Sensorium: memory, attention, language, and fund of knowledge intact  Insight: good Judgment: good  Structured Assessment Results: The Patient Health Questionnaire-9 (PHQ-9) is a self-report measure that assesses symptoms and severity of depression over the course of the last two weeks. Alejandro [name] obtained a score of 4 suggesting minimal depression. Alejandro [name] finds the endorsed symptoms to be somewhat difficult. Little interest or pleasure in doing things 0  Feeling down, depressed, or hopeless 0  Trouble falling or staying asleep, or sleeping too much 0  Feeling tired or having little energy 2  Poor appetite or overeating 1  Feeling bad about yourself --- or that you are a failure or have let yourself or your family down 1  Trouble concentrating on things, such as reading the newspaper or  watching television 0  Moving or speaking so slowly that other people could have  noticed? Or the opposite --- being so fidgety or restless that you have been moving around a lot more than usual 0  Thoughts that you would be better off dead or hurting yourself in some way 0  PHQ-9 Score 4    The Generalized Anxiety Disorder-7 (GAD-7) is a brief self-report measure that assesses symptoms of anxiety over the course of the last two weeks. Alejandro [name] obtained a score of 0. Feeling nervous, anxious, on edge 0  Not being able to stop or control worrying 0  Worrying too much about different things 0  Trouble relaxing 0  Being so restless that it's hard to sit still 0  Becoming easily annoyed or irritable 0  Feeling afraid as if something awful might happen 0  GAD-7 Score 0   Interventions:  Conducted a brief chart review Verbal administration of PHQ-9 and GAD-7 for symptom monitoring Provided empathic reflections and validation Reviewed content from the previous session Psychoeducation provided regarding triggers for emotional eating Employed motivational interviewing skills to assess patient's willingness/desire to adhere to recommended medical treatments and assignments Focused on rapport building Employed supportive psychotherapy interventions to facilitate reduced distress, and to improve coping skills with identified stressors  DSM-5 Diagnosis: 311 (F32.8) Other Specified Depressive Disorder, Emotional Eating Behaviors   Treatment Goal & Progress: During the initial appointment with this provider, the following treatment goal was established: decrease emotional eating. Alejandro [name] has demonstrated some progress in his goal as evidenced by increased awareness of hunger patterns.   Plan: Alejandro [name] continues to appear able and willing to participate as evidenced by engagement in reciprocal conversation, and asking questions for clarification as appropriate. Due to the upcoming holiday and this provider  being out of the office in the coming weeks, the next appointment will be scheduled in three weeks, which will be via [employer]. The next session will focus on the introduction of mindfulness.

## 2019-04-19 ENCOUNTER — Encounter (INDEPENDENT_AMBULATORY_CARE_PROVIDER_SITE_OTHER): Payer: Self-pay | Admitting: Bariatrics

## 2019-04-19 DIAGNOSIS — E6609 Other obesity due to excess calories: Secondary | ICD-10-CM | POA: Insufficient documentation

## 2019-04-23 ENCOUNTER — Ambulatory Visit (INDEPENDENT_AMBULATORY_CARE_PROVIDER_SITE_OTHER): Payer: 59 | Admitting: Bariatrics

## 2019-04-23 ENCOUNTER — Other Ambulatory Visit: Payer: Self-pay

## 2019-04-23 ENCOUNTER — Encounter (INDEPENDENT_AMBULATORY_CARE_PROVIDER_SITE_OTHER): Payer: Self-pay | Admitting: Bariatrics

## 2019-04-23 VITALS — BP 131/78 | HR 66 | Temp 97.8°F | Ht 68.0 in | Wt 248.0 lb

## 2019-04-23 DIAGNOSIS — E559 Vitamin D deficiency, unspecified: Secondary | ICD-10-CM | POA: Diagnosis not present

## 2019-04-23 DIAGNOSIS — R7989 Other specified abnormal findings of blood chemistry: Secondary | ICD-10-CM | POA: Diagnosis not present

## 2019-04-23 DIAGNOSIS — E7849 Other hyperlipidemia: Secondary | ICD-10-CM

## 2019-04-23 DIAGNOSIS — R7303 Prediabetes: Secondary | ICD-10-CM | POA: Diagnosis not present

## 2019-04-23 DIAGNOSIS — Z9189 Other specified personal risk factors, not elsewhere classified: Secondary | ICD-10-CM

## 2019-04-23 DIAGNOSIS — Z6837 Body mass index (BMI) 37.0-37.9, adult: Secondary | ICD-10-CM

## 2019-04-23 MED ORDER — VITAMIN D (ERGOCALCIFEROL) 1.25 MG (50000 UNIT) PO CAPS: 50000.0000 [IU] | ORAL_CAPSULE | ORAL | 0 refills | Status: DC

## 2019-04-24 ENCOUNTER — Encounter (INDEPENDENT_AMBULATORY_CARE_PROVIDER_SITE_OTHER): Payer: Self-pay | Admitting: Bariatrics

## 2019-04-24 ENCOUNTER — Other Ambulatory Visit (INDEPENDENT_AMBULATORY_CARE_PROVIDER_SITE_OTHER): Payer: Self-pay | Admitting: Bariatrics

## 2019-04-24 MED ORDER — METFORMIN HCL 500 MG PO TABS: 500.0000 mg | ORAL_TABLET | Freq: Every day | ORAL | 0 refills | Status: DC

## 2019-04-24 NOTE — Progress Notes (Signed)
Office: [phone]  /  Fax: [phone]   HPI:   Chief Complaint: OBESITY Alejandro [name] is here to discuss his progress with his obesity treatment plan. He is on the Category 4 plan + 200 calories and is following his eating plan approximately 80-85 % of the time. He states he is exercising 0 minutes 0 times per week. Alejandro [name] states that he has done well overall, but needs to have his cereal at night with milk (Cheerios and milk).   His weight is 248 lb (112.5 kg) today and has had a weight loss of 6 pounds over a period of 2 weeks since his last visit. He has lost 6 lbs since starting treatment with [place].  Hyperlipidemia Alejandro [name] has hyperlipidemia and has been trying to improve his cholesterol levels with intensive lifestyle modification including a low saturated fat diet, exercise and weight loss. Last cholesterol level was 302 and triglycerides at 433. He denies any chest pain, claudication, myalgias, or abdominal pain.  Vitamin D Deficiency Alejandro [name] has a diagnosis of vitamin D deficiency. He is not currently taking Vit D. Last Vit D level was 20.1. She denies nausea, vomiting or muscle weakness.  At risk for osteopenia and osteoporosis Alejandro [name] is at higher risk of osteopenia and osteoporosis due to vitamin D deficiency.   Pre-Diabetes Alejandro [name] has a diagnosis of pre-diabetes based on his elevated Hgb A1c and was informed this puts him at greater risk of developing diabetes. Last A1c was 5.7 and insulin of 26.6. He is not taking metformin currently and continues to work on diet and exercise to decrease risk of diabetes. He denies polyphagia or hypoglycemia.  Elevated LFTs Alejandro [name] has a diagnosis of elevated ALT at 66. He denies abdominal pain or jaundice and has a history of fatty liver. He denies excessive alcohol intake.  ASSESSMENT AND PLAN:  Other hyperlipidemia  Vitamin D deficiency - Plan: Vitamin D, Ergocalciferol, (DRISDOL) 1.25 MG (50000 UT) CAPS capsule  Prediabetes   Elevated LFTs  At risk for osteoporosis  Class 2 severe obesity with serious comorbidity and body mass index (BMI) of 37.0 to 37.9 in adult, unspecified obesity type (Alejandro [name])  PLAN:  Hyperlipidemia Alejandro [name] was informed of the American Heart Association Guidelines emphasizing intensive lifestyle modifications as the first line treatment for hyperlipidemia. We discussed many lifestyle modifications today in depth, and Alejandro [name] will continue to work on decreasing carbohydrates, and decreasing saturated fats such as fatty red meat, butter and many fried foods. He will also increase healthy fats, vegetables and lean protein in his diet and continue to work on exercise and weight loss efforts.  Vitamin D Deficiency Alejandro [name] was informed that low vitamin D levels contributes to fatigue and are associated with obesity, breast, and colon cancer. Alejandro [name] agrees to start prescription Vit D 50,000 IU every week #4 with no refills. He will follow up for routine testing of vitamin D, at least 2-3 times per year. He was informed of the risk of over-replacement of vitamin D and agrees to not increase his dose unless he discusses this with [place] first. Alejandro [name] agrees to follow up with our clinic in 2 to 3 weeks.  At risk for osteopenia and osteoporosis Alejandro [name] was given extended (15 minutes) osteoporosis prevention counseling today. Alejandro [name] is at risk for osteopenia and osteoporsis due to his vitamin D deficiency. He was encouraged to take his vitamin D and follow his higher calcium diet and increase strengthening exercise to help strengthen his bones and decrease his risk of osteopenia and osteoporosis.  Pre-Diabetes Alejandro [name] will continue to work on weight loss, exercise, and decreasing simple carbohydrates in his diet to help decrease the risk of diabetes. We dicussed metformin including benefits and risks. He was informed that eating too many simple carbohydrates or too many calories at one sitting increases the  likelihood of GI side effects. Alejandro [name] agrees to start metformin 500 mg 1 tablet PO daily with meal #30 with no refills. Handout for pre-diabetes was given, and he will bring protein snacks to work. Alejandro [name] agrees to follow up with our clinic in 2 to 3 weeks as directed to monitor his progress.  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 Alejandro [name] was educated on his risk of developing NASH or even liver failure and th only proven treatment for NAFLD was weight loss. Alejandro [name] agreed to continue with his weight loss efforts with healthier diet and exercise as an essential part of his treatment plan, and he is to do 150 minutes if cardio and resistance.  Obesity Alejandro [name] is currently in the action stage of change. As such, his goal is to continue with weight loss efforts He has agreed to follow the Category 4 plan + 200 calories Alejandro [name] has been instructed to work up to a goal of 150 minutes of combined cardio and strengthening exercise per week or walk for 20-30 minutes 3 times per week (has a gym at work) for weight loss and overall health benefits. We discussed the following Behavioral Modification Strategies today: increasing lean protein intake, decreasing simple carbohydrates, increasing vegetables, decrease eating out, increase H20 intake, work on meal planning and easy cooking plans, ways to avoid boredom eating, ways to avoid night time snacking, no skipping meals, and keeping healthy foods in the home Alejandro [name] will factor in Cheerois and milk: 260 calories, 1/2 cup and milk. Thanksgiving handout was given.  Alejandro [name] has agreed to follow up with our clinic in 2 to 3 weeks. He was informed of the importance of frequent follow up visits to maximize his success with intensive lifestyle modifications for his multiple health conditions.  ALLERGIES: No Known Allergies  MEDICATIONS: Current Outpatient Medications  on File Prior to Visit  Medication Sig Dispense Refill  . escitalopram (LEXAPRO) 20 MG tablet Take 20 mg by mouth daily.  0   No current facility-administered medications on file prior to visit.     PAST MEDICAL HISTORY: Past Medical History:  Diagnosis Date  . ADD (attention deficit disorder)   . Alcohol abuse   . Anxiety   . Back pain   . Bursitis of both knees   . Degenerative disc disease, lumbar   . Depression   . Drug use   . Fatty liver   . Floppy eyelid syndrome   . GERD (gastroesophageal reflux disease)   . Hyperlipidemia   . Hypertension   . Obesity   . Plantar fasciitis    followed by ortho  . Sleep apnea   . SOB (shortness of breath)     PAST SURGICAL HISTORY: Past Surgical History:  Procedure Laterality Date  . US ECHOCARDIOGRAPHY  08/2010   normal, no LVH    SOCIAL HISTORY: Social History   Tobacco Use  . Smoking status: Never Smoker  . Smokeless tobacco: Never Used  Substance Use Topics  . Alcohol use: No    Frequency: Never  . Drug use: No    FAMILY HISTORY: Family History  Problem Relation Age of  Onset  . Hyperlipidemia Father   . Anxiety disorder Father   . CAD Paternal Grandfather 47  . Diabetes Mother   . Depression Mother   . Sleep apnea Mother   . Alcoholism Mother   . Obesity Mother   . Hypertension Maternal Grandfather   . CAD Maternal Grandfather 30       MI, CABG  . Colon cancer Neg Hx   . Prostate cancer Neg Hx     ROS: Review of Systems  Constitutional: Positive for weight loss.  Eyes:       Negative jaundice  Cardiovascular: Negative for chest pain and claudication.  Gastrointestinal: Negative for abdominal pain, nausea and vomiting.  Musculoskeletal: Negative for myalgias.       Negative muscle weakness  Endo/Heme/Allergies:       Positive polyphagia Negative hypoglycemia    PHYSICAL EXAM: Blood pressure 131/78, pulse 66, temperature 97.8 F (36.6 C), height 5\' 8"  (1.727 m), weight 248 lb (112.5 kg), SpO2  96 %. Body mass index is 37.71 kg/m. Physical Exam Vitals signs reviewed.  Constitutional:      Appearance: Normal appearance. He is obese.  Cardiovascular:     Rate and Rhythm: Normal rate.     Pulses: Normal pulses.  Pulmonary:     Effort: Pulmonary effort is normal.     Breath sounds: Normal breath sounds.  Musculoskeletal: Normal range of motion.  Skin:    General: Skin is warm and dry.  Neurological:     Mental Status: He is alert and oriented to person, place, and time.  Psychiatric:        Mood and Affect: Mood normal.        Behavior: Behavior normal.     RECENT LABS AND TESTS: BMET    Component Value Date/Time   NA 138 04/09/2019 0920   K 4.8 04/09/2019 0920   CL 99 04/09/2019 0920   CO2 25 04/09/2019 0920   GLUCOSE 95 04/09/2019 0920   GLUCOSE 77 05/18/2017 1804   BUN 13 04/09/2019 0920   CREATININE 0.86 04/09/2019 0920   CALCIUM 9.7 04/09/2019 0920   GFRNONAA 109 04/09/2019 0920   GFRAA 126 04/09/2019 0920   Lab Results  Component Value Date   HGBA1C 5.7 (H) 04/09/2019   Lab Results  Component Value Date   INSULIN 26.6 (H) 04/09/2019   CBC No results found for: WBC, RBC, HGB, HCT, PLT, MCV, MCH, MCHC, RDW, LYMPHSABS, MONOABS, EOSABS, BASOSABS Iron/TIBC/Ferritin/ %Sat No results found for: IRON, TIBC, FERRITIN, IRONPCTSAT Lipid Panel     Component Value Date/Time   CHOL 302 (H) 04/09/2019 0920   TRIG 433 (H) 04/09/2019 0920   HDL 49 04/09/2019 0920   CHOLHDL 6 08/26/2017 1449   VLDL 58.8 (H) 08/26/2017 1449   LDLCALC 169 (H) 04/09/2019 0920   LDLDIRECT 224.0 08/26/2017 1449   Hepatic Function Panel     Component Value Date/Time   PROT 7.0 04/09/2019 0920   ALBUMIN 4.7 04/09/2019 0920   AST 36 04/09/2019 0920   ALT 66 (H) 04/09/2019 0920   ALKPHOS 65 04/09/2019 0920   BILITOT 0.3 04/09/2019 0920   BILIDIR 0.0 08/26/2017 1449      Component Value Date/Time   TSH 1.830 04/09/2019 0920   TSH 2.23 08/31/2010 1316      OBESITY  BEHAVIORAL INTERVENTION VISIT  Today's visit was # 2   Starting weight: 254 lbs Starting date: 04/09/2019 Today's weight : 248 lbs Today's date: 04/23/2019 Total lbs lost to  date: 6    ASK: We discussed the diagnosis of obesity with Alejandro [name] today and Alejandro [name] agreed to give [place] permission to discuss obesity behavioral modification therapy today.  ASSESS: Alejandro [name] has the diagnosis of obesity and his BMI today is 37.72 Alejandro [name] is in the action stage of change   ADVISE: Alejandro [name] was educated on the multiple health risks of obesity as well as the benefit of weight loss to improve his health. He was advised of the need for long term treatment and the importance of lifestyle modifications to improve his current health and to decrease his risk of future health problems.  AGREE: Multiple dietary modification options and treatment options were discussed and  Alejandro [name] agreed to follow the recommendations documented in the above note.  ARRANGE: Alejandro [name] was educated on the importance of frequent visits to treat obesity as outlined per CMS and USPSTF guidelines and agreed to schedule his next follow up appointment today.  Wilhemena Durie, am acting as transcriptionist for [employer], DO  I have reviewed the above documentation for accuracy and completeness, and I agree with the above. -Jearld Lesch, DO

## 2019-04-24 NOTE — Telephone Encounter (Signed)
Please review

## 2019-04-25 ENCOUNTER — Encounter (INDEPENDENT_AMBULATORY_CARE_PROVIDER_SITE_OTHER): Payer: Self-pay | Admitting: Bariatrics

## 2019-04-25 ENCOUNTER — Other Ambulatory Visit: Payer: Self-pay

## 2019-04-25 ENCOUNTER — Ambulatory Visit (INDEPENDENT_AMBULATORY_CARE_PROVIDER_SITE_OTHER): Payer: 59 | Admitting: Psychology

## 2019-04-25 DIAGNOSIS — F3289 Other specified depressive episodes: Secondary | ICD-10-CM | POA: Diagnosis not present

## 2019-04-30 ENCOUNTER — Ambulatory Visit: Payer: 59 | Admitting: Family Medicine

## 2019-04-30 ENCOUNTER — Other Ambulatory Visit: Payer: Self-pay

## 2019-04-30 ENCOUNTER — Encounter: Payer: Self-pay | Admitting: Family Medicine

## 2019-04-30 DIAGNOSIS — L989 Disorder of the skin and subcutaneous tissue, unspecified: Secondary | ICD-10-CM | POA: Diagnosis not present

## 2019-04-30 MED ORDER — TRIAMCINOLONE ACETONIDE 0.5 % EX CREA: 1.0000 "application " | TOPICAL_CREAM | Freq: Two times a day (BID) | CUTANEOUS | 0 refills | Status: DC | PRN

## 2019-04-30 NOTE — Patient Instructions (Signed)
Use the cream on the reddish area as needed. Update me as needed.  Keep the frozen spot covered as needed.  It should blister and then heal over. Take care.  Glad to see you.

## 2019-04-30 NOTE — Progress Notes (Signed)
Mood is good and he is working on weight loss, d/w pt.   L popliteal irritation.  No known poison ivy exposure but had been in the yard.  No FCNAVD.  Present for about 10 days.  He had some small blisters.  No other lesions.  No trauma.  Not painful but itchy.    Irritated skin lesion loaded on the left medial calf and he wanted that evaluated/treated.  Meds, vitals, and allergies reviewed.   ROS: Per HPI unless specifically indicated in ROS section   nad 5x7 reddish patch on the popliteal area.  Mild blanching erythema without fluctuant mass.  No bands or cords.  No abscess. 1cm irritated SK on L medial calf.  Discussed options and treated with liq N2 x3 after verbal consent.

## 2019-05-02 DIAGNOSIS — L989 Disorder of the skin and subcutaneous tissue, unspecified: Secondary | ICD-10-CM | POA: Insufficient documentation

## 2019-05-02 NOTE — Assessment & Plan Note (Signed)
Presumed contact dermatitis.  No sign of infection.  Okay to use triamcinolone as needed.  He will update me as needed.  Likely irritated seborrheic keratosis on the left medial calf.  Discussed options and treated with liquid nitrogen x3 after verbal informed consent obtained.  Routine post cryotherapy instructions given to patient he understood.  He will update me as needed.  No complications.

## 2019-05-07 NOTE — Progress Notes (Unsigned)
Office: [phone]  /  Fax: [phone]    Date: May 16, 2019   Appointment Start Time: *** Duration: *** minutes Provider: Glennie Isle, Psy.D. Type of Session: Individual Therapy  Location of Patient: {gbptloc:23249} Location of Provider: {Location of Service:22491} Type of Contact: Telepsychological Visit via [employer]   Session Content: Alejandro [name] is a 38 y.o. male presenting via [place] for a follow-up appointment to address the previously established treatment goal of decreasing emotional eating. Today's appointment was a telepsychological visit, as it is an option for appointments to reduce exposure to COVID-19. Alejandro [name] expressed understanding regarding the rationale for telepsychological services, and provided verbal consent for today's appointment. Prior to proceeding with today's appointment, Alejandro [name]'s physical location at the time of this appointment was obtained. In the event of technical difficulties, Alejandro [name] shared a phone number he could be reached at. Alejandro [name] [place] and this provider participated in today's telepsychological service. Also, Alejandro [name] denied anyone else being present in the room or on the WebEx appointment ***.  This provider conducted a brief check-in and verbally administered the PHQ-9 and GAD-7. *** Alejandro [name] was receptive to today's session as evidenced by openness to sharing, responsiveness to feedback, and ***.  Mental Status Examination:  Appearance: {Appearance:22431} Behavior: {Behavior:22445} Mood: {gbmood:21757} Affect: {Affect:22436} Speech: {Speech:22432} Eye Contact: {Eye Contact:22433} Psychomotor Activity: {Motor Activity:22434} Thought Process: {thought process:22448}  Content/Perceptual Disturbances: {disturbances:22451} Orientation: {Orientation:22437} Cognition/Sensorium: {gbcognition:22449} Insight: {Insight:22446} Judgment: {Insight:22446}  Structured Assessment Results: The Patient Health Questionnaire-9 (PHQ-9) is a self-report  measure that assesses symptoms and severity of depression over the course of the last two weeks. [name] obtained a score of *** suggesting {GBPHQ9SEVERITY:21752}. Alejandro [name] finds the endorsed symptoms to be {gbphq9difficulty:21754}. Little interest or pleasure in doing things ***  Feeling down, depressed, or hopeless ***  Trouble falling or staying asleep, or sleeping too much ***  Feeling tired or having little energy ***  Poor appetite or overeating ***  Feeling bad about yourself --- or that you are a failure or have let yourself or your family down ***  Trouble concentrating on things, such as reading the newspaper or watching television ***  Moving or speaking so slowly that other people could have noticed? Or the opposite --- being so fidgety or restless that you have been moving around a lot more than usual ***  Thoughts that you would be better off dead or hurting yourself in some way ***  PHQ-9 Score ***    The Generalized Anxiety Disorder-7 (GAD-7) is a brief self-report measure that assesses symptoms of anxiety over the course of the last two weeks. Alejandro [name] obtained a score of *** suggesting {gbgad7severity:21753}. Alejandro [name] finds the endorsed symptoms to be {gbphq9difficulty:21754}. 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 Alejandro [name] has demonstrated progress in his goal as  evidenced by {gbtxprogress:22839}. Alejandro [name] also reported {gbtxprogress2:22951}.  Plan: Alejandro [name] continues to appear able and willing to participate as evidenced by engagement in reciprocal conversation, and asking questions  for clarification as appropriate. The next appointment will be scheduled in {gbweeks:21758}, which will be via [employer]. The next session will focus on reviewing learned skills, and working towards the established treatment goal.***

## 2019-05-12 ENCOUNTER — Other Ambulatory Visit (INDEPENDENT_AMBULATORY_CARE_PROVIDER_SITE_OTHER): Payer: Self-pay | Admitting: Bariatrics

## 2019-05-12 DIAGNOSIS — E559 Vitamin D deficiency, unspecified: Secondary | ICD-10-CM

## 2019-05-15 ENCOUNTER — Other Ambulatory Visit: Payer: Self-pay

## 2019-05-15 ENCOUNTER — Encounter (INDEPENDENT_AMBULATORY_CARE_PROVIDER_SITE_OTHER): Payer: Self-pay | Admitting: Bariatrics

## 2019-05-15 ENCOUNTER — Ambulatory Visit (INDEPENDENT_AMBULATORY_CARE_PROVIDER_SITE_OTHER): Payer: 59 | Admitting: Bariatrics

## 2019-05-15 VITALS — BP 123/82 | HR 75 | Temp 97.7°F | Ht 68.0 in | Wt 245.0 lb

## 2019-05-15 DIAGNOSIS — Z6837 Body mass index (BMI) 37.0-37.9, adult: Secondary | ICD-10-CM

## 2019-05-15 DIAGNOSIS — E559 Vitamin D deficiency, unspecified: Secondary | ICD-10-CM

## 2019-05-15 DIAGNOSIS — Z9189 Other specified personal risk factors, not elsewhere classified: Secondary | ICD-10-CM

## 2019-05-15 DIAGNOSIS — R7303 Prediabetes: Secondary | ICD-10-CM | POA: Diagnosis not present

## 2019-05-15 MED ORDER — VITAMIN D (ERGOCALCIFEROL) 1.25 MG (50000 UNIT) PO CAPS: 50000.0000 [IU] | ORAL_CAPSULE | ORAL | 0 refills | Status: DC

## 2019-05-15 MED ORDER — METFORMIN HCL 500 MG PO TABS: 500.0000 mg | ORAL_TABLET | Freq: Every day | ORAL | 0 refills | Status: DC

## 2019-05-15 NOTE — Progress Notes (Signed)
Office: [phone]  /  Fax: [phone]   HPI:   Chief Complaint: OBESITY Alejandro [name] is here to discuss his progress with his obesity treatment plan. He is on the Category 4 plan + 100 calories and is following his eating plan approximately 80% of the time. He states he is walking 45 minutes 2 times per week. Alejandro [name] is down 3 lbs and doing well overall.  His weight is 245 lb (111.1 kg) today and has had a weight loss of 3 pounds over a period of 3 weeks since his last visit. He has lost 9 lbs since starting treatment with [place].  Pre-Diabetes Alejandro [name] has a diagnosis of prediabetes based on his elevated Hgb A1c and was informed this puts him at greater risk of developing diabetes. Last A1c 5.7 on 04/09/2019 with an insulin of 26.6. He is taking metformin currently and continues to work on diet and exercise to decrease risk of diabetes. He denies nausea or hypoglycemia. He reports an increased appetite.  At risk for diabetes Alejandro [name] is at higher than average risk for developing diabetes due to his obesity. He currently denies polyuria or polydipsia.  Vitamin D deficiency Alejandro [name] has a diagnosis of Vitamin D deficiency. Last Vitamin D 20.1 on 04/09/2019. He is currently taking prescription Vit D and denies nausea, vomiting or muscle weakness.  ASSESSMENT AND PLAN:  Vitamin D deficiency - Plan: Vitamin D, Ergocalciferol, (DRISDOL) 1.25 MG (50000 UT) CAPS capsule  Prediabetes - Plan: metFORMIN (GLUCOPHAGE) 500 MG tablet  At risk for diabetes mellitus  Class 2 severe obesity with serious comorbidity and body mass index (BMI) of 37.0 to 37.9 in adult, unspecified obesity type [facility])  PLAN:  Pre-Diabetes Alejandro [name] will continue to work on weight loss, exercise, and decreasing simple carbohydrates in his diet to help decrease the risk of diabetes. We dicussed metformin including benefits and risks. He was informed that eating too many simple carbohydrates or too many calories at one sitting  increases the likelihood of GI side effects. Alejandro [name] was given a prescription for metformin 1 PO daily #30 with 0 refills and agrees to follow-up with our clinic in 2-3 weeks.  Diabetes risk counseling Alejandro [name] was given extended (15 minutes) diabetes prevention counseling today. He is 38 y.o. male and has risk factors for diabetes including obesity. We discussed intensive lifestyle modifications today with an emphasis on weight loss as well as increasing exercise and decreasing simple carbohydrates in his diet.  Vitamin D Deficiency Alejandro [name] was informed that low Vitamin D levels contributes to fatigue and are associated with obesity, breast, and colon cancer. He agrees to continue to take prescription Vit D @ 50,000 IU every week #4 with 0 refills and will follow-up for routine testing of Vitamin D, at least 2-3 times per year. He was informed of the risk of over-replacement of Vitamin D and agrees to not increase his dose unless he discusses this with [place] first. Alejandro [name] agrees to follow-up with our clinic in 2-3 weeks.  Obesity Alejandro [name] is currently in the action stage of change. As such, his goal is to continue with weight loss efforts.  He has agreed to follow the Category 4 plan + 100 calories. Alejandro [name] will work on meal planning and intentional eating. He will have a lockbox for his spouse to know the code. Alejandro [name] has been instructed to continue waking and lift weights for weight loss and overall health benefits. We discussed the following Behavioral Modification Strategies today: increasing lean protein intake, decreasing simple carbohydrates, increasing  vegetables, increase H20 intake, decrease eating out, no skipping meals, work on meal planning and easy cooking plans, and keeping healthy foods in the home.  Alejandro [name] has agreed to follow-up with our clinic in 2-3 weeks. He was informed of the importance of frequent follow-up visits to maximize his success with intensive lifestyle modifications  for his multiple health conditions.  ALLERGIES: No Known Allergies  MEDICATIONS: Current Outpatient Medications on File Prior to Visit  Medication Sig Dispense Refill   escitalopram (LEXAPRO) 20 MG tablet Take 20 mg by mouth daily.  0   triamcinolone cream (KENALOG) 0.5 % Apply 1 application topically 2 (two) times daily as needed. 30 g 0   No current facility-administered medications on file prior to visit.     PAST MEDICAL HISTORY: Past Medical History:  Diagnosis Date   ADD (attention deficit disorder)    Alcohol abuse    Anxiety    Back pain    Bursitis of both knees    Degenerative disc disease, lumbar    Depression    Drug use    Fatty liver    Floppy eyelid syndrome    GERD (gastroesophageal reflux disease)    Hyperlipidemia    Hypertension    Obesity    Plantar fasciitis    followed by ortho   Sleep apnea    SOB (shortness of breath)     PAST SURGICAL HISTORY: Past Surgical History:  Procedure Laterality Date   US ECHOCARDIOGRAPHY  08/2010   normal, no LVH    SOCIAL HISTORY: Social History   Tobacco Use   Smoking status: Never Smoker   Smokeless tobacco: Never Used  Substance Use Topics   Alcohol use: No    Frequency: Never   Drug use: No    FAMILY HISTORY: Family History  Problem Relation Age of Onset   Hyperlipidemia Father    Anxiety disorder Father    CAD Paternal Grandfather 82   Diabetes Mother    Depression Mother    Sleep apnea Mother    Alcoholism Mother    Obesity Mother    Hypertension Maternal Grandfather    CAD Maternal Grandfather 81       MI, CABG   Colon cancer Neg Hx    Prostate cancer Neg Hx    ROS: Review of Systems  Constitutional:       Positive for increased appetite.  Gastrointestinal: Negative for nausea and vomiting.  Musculoskeletal:       Negative for muscle weakness.  Endo/Heme/Allergies:       Negative for hypoglycemia.   PHYSICAL EXAM: Blood pressure 123/82,  pulse 75, temperature 97.7 F (36.5 C), height 5\' 8"  (1.727 m), weight 245 lb (111.1 kg), SpO2 97 %. Body mass index is 37.25 kg/m. Physical Exam Vitals signs reviewed.  Constitutional:      Appearance: Normal appearance. He is obese.  Cardiovascular:     Rate and Rhythm: Normal rate.     Pulses: Normal pulses.  Pulmonary:     Effort: Pulmonary effort is normal.     Breath sounds: Normal breath sounds.  Musculoskeletal: Normal range of motion.  Skin:    General: Skin is warm and dry.  Neurological:     Mental Status: He is alert and oriented to person, place, and time.  Psychiatric:        Behavior: Behavior normal.   RECENT LABS AND TESTS: BMET    Component Value Date/Time   NA 138 04/09/2019 0920   K 4.8  04/09/2019 0920   CL 99 04/09/2019 0920   CO2 25 04/09/2019 0920   GLUCOSE 95 04/09/2019 0920   GLUCOSE 77 05/18/2017 1804   BUN 13 04/09/2019 0920   CREATININE 0.86 04/09/2019 0920   CALCIUM 9.7 04/09/2019 0920   GFRNONAA 109 04/09/2019 0920   GFRAA 126 04/09/2019 0920   Lab Results  Component Value Date   HGBA1C 5.7 (H) 04/09/2019   Lab Results  Component Value Date   INSULIN 26.6 (H) 04/09/2019   CBC No results found for: WBC, RBC, HGB, HCT, PLT, MCV, MCH, MCHC, RDW, LYMPHSABS, MONOABS, EOSABS, BASOSABS Iron/TIBC/Ferritin/ %Sat No results found for: IRON, TIBC, FERRITIN, IRONPCTSAT Lipid Panel     Component Value Date/Time   CHOL 302 (H) 04/09/2019 0920   TRIG 433 (H) 04/09/2019 0920   HDL 49 04/09/2019 0920   CHOLHDL 6 08/26/2017 1449   VLDL 58.8 (H) 08/26/2017 1449   LDLCALC 169 (H) 04/09/2019 0920   LDLDIRECT 224.0 08/26/2017 1449   Hepatic Function Panel     Component Value Date/Time   PROT 7.0 04/09/2019 0920   ALBUMIN 4.7 04/09/2019 0920   AST 36 04/09/2019 0920   ALT 66 (H) 04/09/2019 0920   ALKPHOS 65 04/09/2019 0920   BILITOT 0.3 04/09/2019 0920   BILIDIR 0.0 08/26/2017 1449      Component Value Date/Time   TSH 1.830 04/09/2019  0920   TSH 2.23 08/31/2010 1316   Results for ETHANN, BONACCORSO (MRN [id number]) as of 05/15/2019 16:54  Ref. Range 04/09/2019 09:20  Vitamin D, 25-Hydroxy Latest Ref Range: 30.0 - 100.0 ng/mL 20.1 (L)   OBESITY BEHAVIORAL INTERVENTION VISIT  Today's visit was #3  Starting weight: 254 lbs Starting date: 04/09/2019 Today's weight: 245 lbs Today's date: 05/15/2019 Total lbs lost to date: 9     05/15/2019  Height 5\' 8"  (1.727 m)  Weight 245 lb (111.1 kg)  BMI (Calculated) 37.26  BLOOD PRESSURE - SYSTOLIC [id number]  BLOOD PRESSURE - DIASTOLIC 82   Body Fat % [id number] %  Total Body Water (lbs) 121.4 lbs   ASK: We discussed the diagnosis of obesity with Duke Salvia today and [name] agreed to give [place] permission to discuss obesity behavioral modification therapy today.  ASSESS: Matej has the diagnosis of obesity and his BMI today is 37.3. Watts is in the action stage of change.   ADVISE: Deshane was educated on the multiple health risks of obesity as well as the benefit of weight loss to improve his health. He was advised of the need for long term treatment and the importance of lifestyle modifications to improve his current health and to decrease his risk of future health problems.  AGREE: Multiple dietary modification options and treatment options were discussed and  Yoriel agreed to follow the recommendations documented in the above note.  ARRANGE: Lamonte was educated on the importance of frequent visits to treat obesity as outlined per CMS and USPSTF guidelines and agreed to schedule his next follow up appointment today.  Migdalia Dk, am acting as Location manager for [employer], DO  I have reviewed the above documentation for accuracy and completeness, and I agree with the above. -Jearld Lesch, DO

## 2019-05-16 ENCOUNTER — Telehealth (INDEPENDENT_AMBULATORY_CARE_PROVIDER_SITE_OTHER): Payer: Self-pay | Admitting: Psychology

## 2019-05-16 ENCOUNTER — Encounter (INDEPENDENT_AMBULATORY_CARE_PROVIDER_SITE_OTHER): Payer: Self-pay | Admitting: Bariatrics

## 2019-05-16 ENCOUNTER — Ambulatory Visit (INDEPENDENT_AMBULATORY_CARE_PROVIDER_SITE_OTHER): Payer: 59 | Admitting: Psychology

## 2019-05-16 NOTE — Telephone Encounter (Signed)
  Office: [phone]  /  Fax: [phone]  Date of Call: May 16, 2019  Time of Call: 4:32pm Provider: Glennie Isle, PsyD  CONTENT: This provider called [name] to check-in as he did not present for today's Webex appointment at 4:00pm. A HIPAA compliant voicemail was left requesting a call back. Of note, this provider stayed on the WebEx appointment for 5 minutes prior to signing off per the clinic's grace period policy.    PLAN: This provider will wait for [place] to call back. No further follow-up planned by this provider.

## 2019-05-17 NOTE — Progress Notes (Signed)
Office: [phone]  /  Fax: [phone]    Date: May 22, 2019   Appointment Start Time: 11:00am Duration: 29 minutes Provider: Glennie Isle, Psy.D. Type of Session: Individual Therapy  Location of Patient: Home Location of Provider: [facility] Type of Contact: Telepsychological Visit via [employer] WebEx  Session Content: Alejandro [name] is a 38 y.o. male presenting via [place] for a follow-up appointment to address the previously established treatment goal of decreasing emotional eating. Today's appointment was a telepsychological visit due to COVID-19. Alejandro [name] provided verbal consent for today's telepsychological appointment and he is aware he is responsible for securing confidentiality on his end of the session. Prior to proceeding with today's appointment, [name]'s physical location at the time of this appointment was obtained as well a phone number he could be reached at in the event of technical difficulties. Alejandro [name] and this provider participated in today's telepsychological service.   This provider conducted a brief check-in and verbally administered the PHQ-9 and GAD-7. Alejandro [name] disclosed he tested positive for COVID-19 and he discussed ongoing stressors. Additionally, Alejandro [name] shared, "The diet is going well" and he shared emotional eating "is going better." He believes recent episodes of emotional eating were likely secondary to stress, feeling overwhelmed, and fatigue. Psychoeducation regarding mindfulness was provided. A handout was provided to [name] with further information regarding mindfulness, including exercises. This provider also explained the benefit of mindfulness as it relates to emotional eating. Alejandro [name] was encouraged to engage in the provided exercises between now and the next appointment with this provider. Alejandro [name] agreed. During today's appointment, Alejandro [name] was led through a mindfulness exercise involving his senses. He noted, "This is good." [name]  provided verbal consent during today's appointment for this provider to send a handout about mindfulness via e-mail. Alejandro [name] was receptive to today's appointment as evidenced by openness to sharing, responsiveness to feedback, and willingness to engage in mindfulness exercises to assist with coping.  Mental Status Examination:  Appearance: appropriate grooming and hygiene and casually dressed Behavior: appropriate to circumstances Mood: euthymic Affect: mood congruent and normal range Speech: normal in rate, volume, and tone Eye Contact: appropriate Psychomotor Activity: appropriate Gait: unable to assess Thought Process: linear, logical, and goal directed  Thought Content/Perception: no hallucinations, delusions, bizarre thinking or behavior reported or observed and no evidence of suicidal and homicidal ideation, plan, and intent Orientation: time, person, place and purpose of appointment Memory/Concentration: memory, attention, language, and fund of knowledge intact  Insight/Judgment: good  Structured Assessments Results: The Patient Health Questionnaire-9 (PHQ-9) is a self-report measure that assesses symptoms and severity of depression over the course of the last two weeks. Alejandro [name] obtained a score of 2 suggesting minimal depression. Alejandro [name] finds the endorsed symptoms to be not difficult at all. [0= Not at all; 1= Several days; 2= More than half the days; 3= Nearly every day] Little interest or pleasure in doing things 0  Feeling down, depressed, or hopeless 0  Trouble falling or staying asleep, or sleeping too much 0  Feeling tired or having little energy 1  Poor appetite or overeating 0  Feeling bad about yourself --- or that you are a failure or have let yourself or your family down 1  Trouble concentrating on things, such as reading the newspaper or watching television 0  Moving or speaking so slowly that other people could have noticed? Or the opposite --- being so fidgety or  restless that you have been moving around a lot more than usual 0  Thoughts that  you would be better off dead or hurting yourself in some way 0  PHQ-9 Score 2    The Generalized Anxiety Disorder-7 (GAD-7) is a brief self-report measure that assesses symptoms of anxiety over the course of the last two weeks. [name] obtained a score of 1 suggesting minimal anxiety. Alejandro [name] finds the endorsed symptoms to be not difficult at all. [0= Not at all; 1= Several days; 2= Over half the days; 3= Nearly every day] Feeling nervous, anxious, on edge 1  Not being able to stop or control worrying 0  Worrying too much about different things 0  Trouble relaxing 0  Being so restless that it's hard to sit still 0  Becoming easily annoyed or irritable 0  Feeling afraid as if something awful might happen 0  GAD-7 Score 1   Interventions:  Conducted a brief chart review Verbally administered PHQ-9 and GAD-7 for symptom monitoring Provided empathic reflections and validation Reviewed content from the previous session Provided positive reinforcement Employed supportive psychotherapy interventions to facilitate reduced distress and to improve coping skills with identified stressors Employed motivational interviewing skills to assess patient's willingness/desire to adhere to recommended medical treatments and assignments Psychoeducation provided regarding mindfulness Engaged patient in mindfulness exercise(s) Employed acceptance and commitment interventions to emphasize mindfulness and acceptance without struggle  DSM-5 Diagnosis: 311 (F32.8) Other Specified Depressive Disorder, Emotional Eating Behaviors  Treatment Goal & Progress: During the initial appointment with this provider, the following treatment goal was established: decrease emotional eating. Alejandro [name] has demonstrated progress in his goal as evidenced by increased awareness of hunger patterns, triggers for emotional eating and reduction in emotional  eating. Alejandro [name] also demonstrates willingness to engage in mindfulness exercises.  Plan:  Due to the upcoming holidays and this provider being out of the office in the coming weeks, the next appointment will be scheduled in approximately one month, which will be via [employer]. The next session will focus on mindfulness . Of note, Dorman was encouraged again to contact [place] to schedule an initial appointment. He noted he plans on still calling.

## 2019-05-19 ENCOUNTER — Encounter: Payer: Self-pay | Admitting: Family Medicine

## 2019-05-19 ENCOUNTER — Ambulatory Visit (INDEPENDENT_AMBULATORY_CARE_PROVIDER_SITE_OTHER): Payer: 59 | Admitting: Family Medicine

## 2019-05-19 ENCOUNTER — Other Ambulatory Visit: Payer: Self-pay

## 2019-05-19 VITALS — Ht 68.0 in | Wt 245.0 lb

## 2019-05-19 DIAGNOSIS — Z20828 Contact with and (suspected) exposure to other viral communicable diseases: Secondary | ICD-10-CM

## 2019-05-19 DIAGNOSIS — R197 Diarrhea, unspecified: Secondary | ICD-10-CM | POA: Diagnosis not present

## 2019-05-19 DIAGNOSIS — Z20822 Contact with and (suspected) exposure to covid-19: Secondary | ICD-10-CM

## 2019-05-19 NOTE — Progress Notes (Signed)
Patient: Alejandro [name] MRN: [id number] DOB: 02/07/1981 PCP: Tonia Ghent, MD     I connected with Duke Salvia on 05/19/19 at 12:23pm by a video enabled telemedicine application and verified that I am speaking with the correct person using two identifiers.  Location patient: Home Location provider: [facility], Office Persons participating in this virtual visit: Alejandro [name] and Dr. Rogers Blocker   I discussed the limitations of evaluation and management by telemedicine and the availability of in person appointments. The patient expressed understanding and agreed to proceed.   Subjective:  Chief Complaint  Patient presents with  . Diarrhea  . Generalized Body Aches  . Fatigue    HPI: The patient is a 38 y.o. male who presents today for possible covid infection. He was exposed at his office yesterday (or had positive case, unsure if exposed as HR policy they do not name person). 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 He stays in office with door closed. Today he started to have symptoms of stomach ache, bad diarrhea and feeling achy. He has had no fever or URI symptoms. He has not lost his sense of taste or smell. Mild headaches. No other sick contacts that he is aware of. His younger son does have a cold and his kids are in school. No other covid exposure that he is aware of. He has had 15 episodes of diarrhea. He just took imodium. He is able to drink fluids. He is on a diet for weight loss and did eat some stuff last night that may have caused it. No one else in his house has diarrhea.   Review of Systems  Constitutional: Positive for fatigue.  HENT: Negative for congestion, postnasal drip, rhinorrhea, sore throat and trouble swallowing.   Eyes: Negative for photophobia and pain.  Respiratory: Negative for cough, shortness of breath and wheezing.   Cardiovascular: Negative for chest pain, palpitations and leg swelling.  Gastrointestinal: Positive for abdominal pain and diarrhea.  Negative for nausea and vomiting.  Musculoskeletal: Positive for myalgias.  Skin: Negative for rash.  Neurological: Positive for headaches. Negative for dizziness.    Allergies Patient has No Known Allergies.  Past Medical History Patient  has a past medical history of ADD (attention deficit disorder), Alcohol abuse, Anxiety, Back pain, Bursitis of both knees, Degenerative disc disease, lumbar, Depression, Drug use, Fatty liver, Floppy eyelid syndrome, GERD (gastroesophageal reflux disease), Hyperlipidemia, Hypertension, Obesity, Plantar fasciitis, Sleep apnea, and SOB (shortness of breath).  Surgical History Patient  has a past surgical history that includes US ECHOCARDIOGRAPHY (08/2010).  Family History Pateint's family history includes Alcoholism in his mother; Anxiety disorder in his father; CAD (age of onset: 11) in his maternal grandfather; CAD (age of onset: 70) in his paternal grandfather; Depression in his mother; Diabetes in his mother; Hyperlipidemia in his father; Hypertension in his maternal grandfather; Obesity in his mother; Sleep apnea in his mother.  Social History Patient  reports that he has never smoked. He has never used smokeless tobacco. He reports that he does not drink alcohol or use drugs.    Objective: Vitals:   05/19/19 1217  Weight: 245 lb (111.1 kg)  Height: 5\' 8"  (1.727 m)    Body mass index is 37.25 kg/m.  Physical Exam Vitals signs reviewed.  Constitutional:      Appearance: He is obese.  HENT:     Head: Normocephalic and atraumatic.  Pulmonary:     Effort: Pulmonary effort is normal.  Neurological:  General: No focal deficit present.     Mental Status: He is alert and oriented to person, place, and time.  Psychiatric:        Mood and Affect: Mood normal.        Behavior: Behavior normal.        Assessment/plan: 1. Exposure to COVID-19 virus Will order test for him as he has had exposure and GI symptoms/achiness. Discussed  quarantining at home until test back per CDC guidelines. Precautions given. Note for work given.  - Novel Coronavirus, NAA ([facility])  2. Diarrhea, unspecified type Fluids, imodium, see above. Dehydration precautions. BRAT diet.     Return if symptoms worsen or fail to improve.  Records requested if needed. Time spent with patient: 15 minutes, of which >50% was spent in obtaining information about his symptoms, reviweing his previous labs, evaluations, and treatments, counseling him about his conditions (please see discussed topics above), and developing a plan to further investigate it; he had a number of questions which I addressed.    Orma Flaming, MD [place]  05/19/2019

## 2019-05-21 ENCOUNTER — Telehealth: Payer: Self-pay

## 2019-05-21 ENCOUNTER — Other Ambulatory Visit (INDEPENDENT_AMBULATORY_CARE_PROVIDER_SITE_OTHER): Payer: Self-pay | Admitting: Bariatrics

## 2019-05-21 DIAGNOSIS — R7303 Prediabetes: Secondary | ICD-10-CM

## 2019-05-21 NOTE — Telephone Encounter (Addendum)
Pt received + covid test from rapid covid test done at [facility] in [place] today. Pt wants to know if needs another test to verify the rapid test. Pt had virtual visit on 05/19/19 with stomach ache, achiness and diarrhea. 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 Pt feels like he has a fever but pt does not have a fever; pt has chills, feels weak with fatigue. Pt does not have H/A,CP or SOB or cough Pt said abd pain pain has subsided but pt had abd pain and diarrhea over the weekend but none today. Pt is self quarantining;pt request cb. Advised pt to drink plenty of liquids and rest. UC & ED precautions given and pt voiced understanding.

## 2019-05-21 NOTE — Telephone Encounter (Signed)
Patient advised.

## 2019-05-21 NOTE — Telephone Encounter (Signed)
Presume true covid infection, wouldn't need f/u testing.  Would follow routine quarantine guidelines.  Symptomatic tx- tylenol, rest, fluids.  If he has vomiting or cough or other sx, then let me know.  We can try to treat those sx if/as they arise.  Update me as needed, I hope he feels better soon.  Thanks.

## 2019-05-22 ENCOUNTER — Other Ambulatory Visit: Payer: Self-pay

## 2019-05-22 ENCOUNTER — Ambulatory Visit (INDEPENDENT_AMBULATORY_CARE_PROVIDER_SITE_OTHER): Payer: 59 | Admitting: Psychology

## 2019-05-22 DIAGNOSIS — F3289 Other specified depressive episodes: Secondary | ICD-10-CM | POA: Diagnosis not present

## 2019-05-28 ENCOUNTER — Telehealth: Payer: Self-pay | Admitting: Family Medicine

## 2019-05-28 NOTE — Telephone Encounter (Signed)
Patient advised.

## 2019-05-28 NOTE — Telephone Encounter (Signed)
With a neg test and 10 days from sx onset, should be able to return to work with masking, etc.  Update me as needed.  Thanks .

## 2019-05-28 NOTE — Telephone Encounter (Signed)
Pt called wanting to get advise  He tested positive 1 week ago today.  He took a rapid test today and it was negative.  He wanted some advise on if and when he can go back to work.   Pt is not have any symptom except linger fatigue

## 2019-05-29 ENCOUNTER — Encounter (INDEPENDENT_AMBULATORY_CARE_PROVIDER_SITE_OTHER): Payer: Self-pay | Admitting: Bariatrics

## 2019-05-29 ENCOUNTER — Other Ambulatory Visit: Payer: Self-pay

## 2019-05-29 ENCOUNTER — Telehealth (INDEPENDENT_AMBULATORY_CARE_PROVIDER_SITE_OTHER): Payer: 59 | Admitting: Bariatrics

## 2019-05-29 DIAGNOSIS — R7303 Prediabetes: Secondary | ICD-10-CM | POA: Diagnosis not present

## 2019-05-29 DIAGNOSIS — Z6838 Body mass index (BMI) 38.0-38.9, adult: Secondary | ICD-10-CM | POA: Diagnosis not present

## 2019-05-29 DIAGNOSIS — E559 Vitamin D deficiency, unspecified: Secondary | ICD-10-CM

## 2019-05-30 NOTE — Progress Notes (Signed)
Office: [phone]  /  Fax: [phone] TeleHealth Visit:  [name] has verbally consented to this TeleHealth visit today. The patient is located at home, the provider is located at the [employer] [facility] office. The participants in this visit include the listed provider and patient. The visit was conducted today via Webex.  HPI:  Chief Complaint: OBESITY Alejandro [name] is here to discuss his progress with his obesity treatment plan. He is on the Category 4 plan + 100 calories and states he is following his eating plan approximately 50% of the time. He states he is exercising 0 minutes 0 times per week.  [name] states that he has gained a few lbs. He is COVID positive and is doing well.  Today's visit was #4 Starting weight: 254 lbs Starting date: 04/09/2019   Vitamin D deficiency Alejandro [name] has a diagnosis of Vitamin D deficiency and is taking Vitamin D. No nausea, vomiting, or muscle weakness.  Prediabetes Alejandro [name] has a diagnosis of prediabetes and is taking metformin (has stopped while sick).  ASSESSMENT AND PLAN:  Vitamin D deficiency  Prediabetes  Class 2 severe obesity with serious comorbidity and body mass index (BMI) of 38.0 to 38.9 in adult, unspecified obesity type ([place])  PLAN:  Vitamin D Deficiency Alejandro [name] was informed that low Vitamin D levels contributes to fatigue and are associated with obesity, breast, and colon cancer. He agrees to continue Vit D and will follow-up for routine testing of Vitamin D, at least 2-3 times per year. He was informed of the risk of over-replacement of Vitamin D and agrees to not increase his dose unless he discusses this with [place] first. Alejandro [name] agrees to follow-up with our clinic in 3 weeks.  Pre-Diabetes Alejandro [name] will continue to work on weight loss, exercise, and decreasing simple carbohydrates to help decrease the risk of diabetes. He will resume metformin when feeling better.  Obesity Alejandro [name] is currently in the action  stage of change. As such, his goal is to continue with weight loss efforts. He has agreed to follow the Category 4 plan + 100 calories. Alejandro [name] will work on meal planning, intentional eating, and increasing his water intake. Alejandro [name] has been instructed to work up to a goal of 150 minutes of combined cardio and strengthening exercise per week for weight loss and overall health benefits. We discussed the following Behavioral Modification Strategies today: increasing lean protein intake, decreasing simple carbohydrates, increasing vegetables, increase H20 intake, decrease eating out, no skipping meals, work on meal planning and easy cooking plans, and keeping healthy foods in the home.  Alejandro [name] has agreed to follow-up with our clinic in 3 weeks. He was informed of the importance of frequent follow-up visits to maximize his success with intensive lifestyle modifications for his multiple health conditions.  ALLERGIES: No Known Allergies  MEDICATIONS: Current Outpatient Medications on File Prior to Visit  Medication Sig Dispense Refill  . escitalopram (LEXAPRO) 20 MG tablet Take 20 mg by mouth daily.  0  . metFORMIN (GLUCOPHAGE) 500 MG tablet Take 1 tablet (500 mg total) by mouth daily with lunch. 30 tablet 0  . Vitamin D, Ergocalciferol, (DRISDOL) 1.25 MG (50000 UT) CAPS capsule Take 1 capsule (50,000 Units total) by mouth every 7 (seven) days. 4 capsule 0   No current facility-administered medications on file prior to visit.    PAST MEDICAL HISTORY: Past Medical History:  Diagnosis Date  . ADD (attention deficit disorder)   . Alcohol abuse   . Anxiety   . Back  pain   . Bursitis of both knees   . Degenerative disc disease, lumbar   . Depression   . Drug use   . Fatty liver   . Floppy eyelid syndrome   . GERD (gastroesophageal reflux disease)   . Hyperlipidemia   . Hypertension   . Obesity   . Plantar fasciitis    followed by ortho  . Sleep apnea   . SOB (shortness of breath)      PAST SURGICAL HISTORY: Past Surgical History:  Procedure Laterality Date  . US ECHOCARDIOGRAPHY  08/2010   normal, no LVH    SOCIAL HISTORY: Social History   Tobacco Use  . Smoking status: Never Smoker  . Smokeless tobacco: Never Used  Substance Use Topics  . Alcohol use: No  . Drug use: No    FAMILY HISTORY: Family History  Problem Relation Age of Onset  . Hyperlipidemia Father   . Anxiety disorder Father   . CAD Paternal Grandfather 56  . Diabetes Mother   . Depression Mother   . Sleep apnea Mother   . Alcoholism Mother   . Obesity Mother   . Hypertension Maternal Grandfather   . CAD Maternal Grandfather 30       MI, CABG  . Colon cancer Neg Hx   . Prostate cancer Neg Hx    ROS: Review of Systems  Gastrointestinal: Negative for nausea and vomiting.  Musculoskeletal:       Negative for muscle weakness.   PHYSICAL EXAM: There were no vitals taken for this visit. There is no height or weight on file to calculate BMI. Physical Exam: Pt in no acute distress.  RECENT LABS AND TESTS: BMET    Component Value Date/Time   NA 138 04/09/2019 0920   K 4.8 04/09/2019 0920   CL 99 04/09/2019 0920   CO2 25 04/09/2019 0920   GLUCOSE 95 04/09/2019 0920   GLUCOSE 77 05/18/2017 1804   BUN 13 04/09/2019 0920   CREATININE 0.86 04/09/2019 0920   CALCIUM 9.7 04/09/2019 0920   GFRNONAA 109 04/09/2019 0920   GFRAA 126 04/09/2019 0920   Lab Results  Component Value Date   HGBA1C 5.7 (H) 04/09/2019   Lab Results  Component Value Date   INSULIN 26.6 (H) 04/09/2019   CBC No results found for: WBC, RBC, HGB, HCT, PLT, MCV, MCH, MCHC, RDW, LYMPHSABS, MONOABS, EOSABS, BASOSABS Iron/TIBC/Ferritin/ %Sat No results found for: IRON, TIBC, FERRITIN, IRONPCTSAT Lipid Panel     Component Value Date/Time   CHOL 302 (H) 04/09/2019 0920   TRIG 433 (H) 04/09/2019 0920   HDL 49 04/09/2019 0920   CHOLHDL 6 08/26/2017 1449   VLDL 58.8 (H) 08/26/2017 1449   LDLCALC 169 (H)  04/09/2019 0920   LDLDIRECT 224.0 08/26/2017 1449   Hepatic Function Panel     Component Value Date/Time   PROT 7.0 04/09/2019 0920   ALBUMIN 4.7 04/09/2019 0920   AST 36 04/09/2019 0920   ALT 66 (H) 04/09/2019 0920   ALKPHOS 65 04/09/2019 0920   BILITOT 0.3 04/09/2019 0920   BILIDIR 0.0 08/26/2017 1449      Component Value Date/Time   TSH 1.830 04/09/2019 0920   TSH 2.23 08/31/2010 1316    OBESITY BEHAVIORAL INTERVENTION VISIT DOCUMENTATION FOR INSURANCE (~15 minutes)  I, Michaelene Song, am acting as Location manager for [employer], DO  I have reviewed the above documentation for accuracy and completeness, and I agree with the above. Jearld Lesch, DO

## 2019-06-06 ENCOUNTER — Encounter (INDEPENDENT_AMBULATORY_CARE_PROVIDER_SITE_OTHER): Payer: Self-pay

## 2019-06-10 ENCOUNTER — Other Ambulatory Visit (INDEPENDENT_AMBULATORY_CARE_PROVIDER_SITE_OTHER): Payer: Self-pay | Admitting: Bariatrics

## 2019-06-10 DIAGNOSIS — E559 Vitamin D deficiency, unspecified: Secondary | ICD-10-CM

## 2019-06-18 NOTE — Progress Notes (Signed)
Office: [phone]  /  Fax: [phone]    Date: June 21, 2019    Appointment Start Time: 2:31pm Duration: 31 minutes Provider: Glennie Isle, Psy.D. Type of Session: Individual Therapy  Location of Patient: Work Biomedical scientist of Provider: [facility] Type of Contact: Telepsychological Visit via [employer]  Session Content: Alejandro [name] is a 39 y.o. male presenting via [place] for a follow-up appointment to address the previously established treatment goal of decreasing emotional eating. Today's appointment was a telepsychological visit due to COVID-19. Alejandro [name] provided verbal consent for today's telepsychological appointment and he is aware he is responsible for securing confidentiality on his end of the session. Prior to proceeding with today's appointment, Alejandro [name]'s physical location at the time of this appointment was obtained as well a phone number he could be reached at in the event of technical difficulties. Alejandro [name] and this provider participated in today's telepsychological service.   This provider conducted a brief check-in. [name] shared about recent events, including resuming a routine after being sick and the holidays. Since the first of the year, he reported, "I'm 100%," adding he started exercising. Positive reinforcement was provided. Additionally, Alejandro [name] reported experiencing challenges when he is surrounded by foods not congruent to his meal plan. As such, psychoeducation regarding all or nothing thinking was provided and prioritizing goals was discussed. Moreover, Alejandro [name] acknowledged he did not have the opportunity to follow mindfulness, but stated he is "excited by it." Psychoeducation regarding formal (e.g., setting aside a specific time daily to engage in an exercise) and informal (e.g., cultivating awareness in the present moment and taking a non-judgmental approach while engaging in day-to-day tasks) mindfulness was provided. This provider discussed the  utilization of YouTube for mindfulness exercises (e.g., videos by Merri Ray). He agreed to engage in mindfulness exercises between now and the next appointment. Alejandro [name] also agreed to call [place] prior to the next appointment with this provider to schedule an initial appointment. Overall, Alejandro [name] was receptive to today's appointment as evidenced by openness to sharing, responsiveness to feedback, and willingness to engage in mindfulness exercises to assist with coping.  Mental Status Examination:  Appearance: well groomed and appropriate hygiene  Behavior: appropriate to circumstances Mood: euthymic Affect: mood congruent Speech: normal in rate, volume, and tone Eye Contact: appropriate Psychomotor Activity: appropriate Gait: unable to assess Thought Process: linear, logical, and goal directed  Thought Content/Perception: no hallucinations, delusions, bizarre thinking or behavior reported or observed and no evidence of suicidal and homicidal ideation, plan, and intent Orientation: time, person, place and purpose of appointment Memory/Concentration: memory, attention, language, and fund of knowledge intact  Insight/Judgment: good  Interventions:  Conducted a brief chart review Provided empathic reflections and validation Reviewed content from the previous session Provided positive reinforcement Employed supportive psychotherapy interventions to facilitate reduced distress and to improve coping skills with identified stressors Employed motivational interviewing skills to assess patient's willingness/desire to adhere to recommended medical treatments and assignments Psychoeducation provided regarding mindfulness  Psychoeducation provided regarding all-or-nothing thinking  DSM-5 Diagnosis: 311 (F32.8) Other Specified Depressive Disorder, Emotional Eating Behaviors  Treatment Goal & Progress: During the initial appointment with this provider, the following treatment  goal was established: decrease emotional eating. Alejandro [name] has demonstrated progress in his goal as evidenced by increased awareness of hunger patterns and increased awareness of triggers for emotional eating. Alejandro [name] also demonstrates willingness to engage in mindfulness exercises.  Plan: The next appointment will be scheduled in two weeks, which will be via [employer]. The  next session will focus on working towards the established treatment goal.

## 2019-06-19 ENCOUNTER — Ambulatory Visit (INDEPENDENT_AMBULATORY_CARE_PROVIDER_SITE_OTHER): Payer: 59 | Admitting: Bariatrics

## 2019-06-19 ENCOUNTER — Other Ambulatory Visit: Payer: Self-pay

## 2019-06-19 ENCOUNTER — Encounter (INDEPENDENT_AMBULATORY_CARE_PROVIDER_SITE_OTHER): Payer: Self-pay | Admitting: Bariatrics

## 2019-06-19 VITALS — BP 127/78 | HR 86 | Temp 97.9°F | Ht 68.0 in | Wt 253.0 lb

## 2019-06-19 DIAGNOSIS — Z9189 Other specified personal risk factors, not elsewhere classified: Secondary | ICD-10-CM | POA: Diagnosis not present

## 2019-06-19 DIAGNOSIS — Z6838 Body mass index (BMI) 38.0-38.9, adult: Secondary | ICD-10-CM

## 2019-06-19 DIAGNOSIS — E7849 Other hyperlipidemia: Secondary | ICD-10-CM | POA: Diagnosis not present

## 2019-06-19 DIAGNOSIS — E559 Vitamin D deficiency, unspecified: Secondary | ICD-10-CM

## 2019-06-19 MED ORDER — ATORVASTATIN CALCIUM 20 MG PO TABS: 20.0000 mg | ORAL_TABLET | Freq: Every day | ORAL | 0 refills | Status: DC

## 2019-06-19 MED ORDER — VITAMIN D (ERGOCALCIFEROL) 1.25 MG (50000 UNIT) PO CAPS: 50000.0000 [IU] | ORAL_CAPSULE | ORAL | 0 refills | Status: DC

## 2019-06-20 NOTE — Progress Notes (Signed)
Chief Complaint: OBESITY Alejandro [name] is here to discuss his progress with his obesity treatment plan along with follow-up of his obesity related diagnoses. Alejandro [name] is on the Category 4 Plan + 100 calories and states he is following his eating plan approximately 0% of the time. Alejandro [name] states he is exercising 0 minutes 0 times per week.  Today's visit was #: 5 Starting weight: 254 lbs Starting date: 04/09/2019 Today's weight: 253 lbs Today's date: 06/19/2019 Total lbs lost to date: 1 Total lbs lost since last in-office visit: 0  Interim History: Alejandro [name] has gained back 8 lbs during the holidays. He reports doing better with his water intake.  Subjective:   Vitamin D deficiency. No nausea, vomiting, or muscle weakness.  Other hyperlipidemia. [name] has elevated total cholesterol (302), LDL (169) , and elevated triglycerides (433) on labs dated 04/09/2019. He had taken Lipitor with no side effects.  At risk for heart disease. Danger was given (~15 minutes) coronary artery disease prevention counseling today. He is 39 y.o. male and has risk factors for heart disease including obesity. We discussed intensive lifestyle modifications today with an emphasis on specific weight loss instructions and strategies.    Assessment/Plan:   Vitamin D deficiency. Vitamin D, Ergocalciferol, (DRISDOL) 1.25 MG (50000 UT) CAPS capsule 1 weekly #4 with 0 refills was prescribed.  Other hyperlipidemia. Alejandro [name] will decrease carbohydrates. Atorvastatin (LIPITOR) 20 MG tablet 1 PO daily #30 with 0 refills was prescribed.  At risk for heart disease. Alejandro [name] was given (~15 minutes) coronary artery disease prevention counseling today. He is 39 y.o. male and has risk factors for heart disease including obesity. We discussed intensive lifestyle modifications today with an emphasis on specific weight loss instructions and strategies.   Class 2 severe obesity with serious comorbidity and body mass index (BMI)  of 38.0 to 38.9 in adult, unspecified obesity type ([place]).   Alejandro [name] is currently in the action stage of change. As such, his goal is to continue with weight loss efforts. He has agreed to Category 4 Plan + 100 calories. He will work on meal planning and intentional eating.  We discussed the following exercise goals today: [address] (similar to [employer]) and boxing.  We discussed the following behavioral modification strategies today: increasing lean protein intake, decreasing simple carbohydrates, increasing vegetables, increasing water intake, decreasing eating out, no skipping meals, meal planning and cooking strategies, keeping healthy foods in the home and planning for success.  Alejandro [name] has agreed to follow-up with our clinic in 2-3 weeks. He was informed of the importance of frequent follow-up visits to maximize his success with intensive lifestyle modifications for his multiple health conditions.  Objective:   Blood pressure 127/78, pulse 86, temperature 97.9 F (36.6 C), height 5\' 8"  (1.727 m), weight 253 lb (114.8 kg), SpO2 98 %. Body mass index is 38.47 kg/m.  General: Cooperative, alert, well developed, in no acute distress. HEENT: Conjunctivae and lids unremarkable. Neck: No thyromegaly.  Cardiovascular: Regular rhythm.  Lungs: Normal work of breathing. Extremities: No edema.  Neurologic: No focal deficits.   Lab Results  Component Value Date   CREATININE 0.86 04/09/2019   BUN 13 04/09/2019   NA 138 04/09/2019   K 4.8 04/09/2019   CL 99 04/09/2019   CO2 25 04/09/2019   Lab Results  Component Value Date   ALT 66 (H) 04/09/2019   AST 36 04/09/2019   ALKPHOS 65 04/09/2019   BILITOT 0.3 04/09/2019   Lab Results  Component Value Date   HGBA1C 5.7 (H) 04/09/2019   Lab Results  Component Value Date   INSULIN 26.6 (H) 04/09/2019   Lab Results  Component Value Date   TSH 1.830 04/09/2019   Lab Results  Component Value Date   CHOL 302 (H)  04/09/2019   HDL 49 04/09/2019   LDLCALC 169 (H) 04/09/2019   LDLDIRECT 224.0 08/26/2017   TRIG 433 (H) 04/09/2019   CHOLHDL 6 08/26/2017   No results found for: WBC, HGB, HCT, MCV, PLT No results found for: IRON, TIBC, FERRITIN  Attestation Statements:   Reviewed by clinician on day of visit: allergies, medications, problem list, medical history, surgical history, family history, social history and previous encounter notes.  This visit occurred during the SARS-CoV-2 public health emergency. Safety protocols were in place, including screening questions prior to the visit, additional usage of staff PPE, and extensive cleaning of exam room while observing appropriate contact time as indicated for disinfecting solutions. (CPT [id number])  I, Michaelene Song, am acting as transcriptionist for [employer], DO  I have reviewed the above documentation for accuracy and completeness, and I agree with the above. Jearld Lesch, DO

## 2019-06-21 ENCOUNTER — Ambulatory Visit (INDEPENDENT_AMBULATORY_CARE_PROVIDER_SITE_OTHER): Payer: 59 | Admitting: Psychology

## 2019-06-21 DIAGNOSIS — F3289 Other specified depressive episodes: Secondary | ICD-10-CM

## 2019-06-21 NOTE — Progress Notes (Signed)
  Office: [phone]  /  Fax: [phone]    Date: July 05, 2019   Appointment Start Time: 2:39pm Duration: 26 minutes Provider: Glennie Isle, Psy.D. Type of Session: Individual Therapy  Location of Patient: Work Biomedical scientist of Provider: [facility] Type of Contact: Telepsychological Visit via [employer] (for video) and telephone call (for audio)  Session Content: This provider called Alejandro [name] at 2:35pm as he did not present for the WebEx appointment. The e-mail with the secure link was re-sent. As such, today's appointment was initiated 9 minutes late. Alejandro [name] is a 39 y.o. male presenting via Alejandro [name] (for video) and telephone call for a follow-up appointment to address the previously established treatment goal of decreasing emotional eating. Today's appointment was a telepsychological visit due to COVID-19. Alejandro [name] provided verbal consent for today's telepsychological appointment and he is aware he is responsible for securing confidentiality on his end of the session. Prior to proceeding with today's appointment, Alejandro [name]'s physical location at the time of this appointment was obtained as well a phone number he could be reached at in the event of technical difficulties. Alejandro [name] and this provider participated in today's telepsychological service.   This provider conducted a brief check-in. Alejandro [name] shared, "Everything has been good. I've been working out a lot." He added, "Following the diet a solid 85%." He noted a reduction in emotional eating. However, Alejandro [name] stated experiencing worry about success moving forward. Thus, he was engaged in thought challenging by exploring evidence for and evidence against the thought "I am a failure." He described feeling surprised that there was more evidence against the aforementioned thought and a balanced thought was developed. He was receptive to the exercise as evidenced by him stating, "This is good." Alejandro [name] was receptive to  today's appointment as evidenced by openness to sharing, responsiveness to feedback, and willingness to engage in thought challenging as needed.  Mental Status Examination:  Appearance: well groomed and appropriate hygiene  Behavior: appropriate to circumstances Mood: euthymic Affect: mood congruent Speech: normal in rate, volume, and tone Eye Contact: appropriate Psychomotor Activity: appropriate Gait: unable to assess Thought Process: linear, logical, and goal directed  Thought Content/Perception: no hallucinations, delusions, bizarre thinking or behavior reported or observed and no evidence of suicidal and homicidal ideation, plan, and intent Orientation: time, person, place and purpose of appointment Memory/Concentration: memory, attention, language, and fund of knowledge intact  Insight/Judgment: good  Interventions:  Conducted a brief chart review Provided empathic reflections and validation Employed supportive psychotherapy interventions to facilitate reduced distress and to improve coping skills with identified stressors Employed motivational interviewing skills to assess patient's willingness/desire to adhere to recommended medical treatments and assignments Engaged patient in thought challenging/cognitive restructuring   DSM-5 Diagnosis: 311 (F32.8) Other Specified Depressive Disorder, Emotional Eating Behaviors  Treatment Goal & Progress: During the initial appointment with this provider, the following treatment goal was established: decrease emotional eating. Alejandro [name] has demonstrated progress in his goal as evidenced by increased awareness of hunger patterns, increased awareness of triggers for emotional eating and reduction in emotional eating. Alejandro [name] also continues to demonstrate willingness to engage in learned skill(s).  Plan: The next appointment will be scheduled in two weeks, which will be via [employer]. The next session will focus on working towards the established  treatment goal and termination planning.

## 2019-06-23 ENCOUNTER — Encounter (INDEPENDENT_AMBULATORY_CARE_PROVIDER_SITE_OTHER): Payer: Self-pay | Admitting: Bariatrics

## 2019-07-05 ENCOUNTER — Ambulatory Visit (INDEPENDENT_AMBULATORY_CARE_PROVIDER_SITE_OTHER): Payer: 59 | Admitting: Psychology

## 2019-07-05 ENCOUNTER — Other Ambulatory Visit: Payer: Self-pay

## 2019-07-05 DIAGNOSIS — F3289 Other specified depressive episodes: Secondary | ICD-10-CM

## 2019-07-05 NOTE — Progress Notes (Signed)
  Office: [phone]  /  Fax: [phone]    Date: July 19, 2019   Appointment Start Time: 4:32pm Duration: 30 minutes Provider: Glennie Isle, Psy.D. 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 male presenting via [place] for a follow-up appointment to address the previously established treatment goal of decreasing emotional eating. Today's appointment was a telepsychological visit due to COVID-19. [name] provided verbal consent for today's telepsychological appointment and he is aware he is responsible for securing confidentiality on his end of the session. Prior to proceeding with today's appointment, Bubba's physical location at the time of this appointment was obtained as well a phone number he could be reached at in the event of technical difficulties. [name] and this provider participated in today's telepsychological service.   This provider conducted a brief check-in. Brindon shared he is feeling "discouraged" about his progress. This was explored further, including his eating habits. It was reflected he was likely not consuming enough protein especially during lunch. Mercury acknowledged he has not been journaling regularly. In addition, he also discussed eating extra snack calories; however, noted a reduction in emotional eating. As such, psychoeducation was provided regarding SMART goals. He established a goal to stay within his snack calories 4 out of 7 days between now and the next appointment with this provider. Overall, Yoshinobu was receptive to today's appointment as evidenced by openness to sharing, responsiveness to feedback, and willingness to work towards the established goal.  Mental Status Examination:  Appearance: well groomed and appropriate hygiene  Behavior: appropriate to circumstances Mood:  euthymic Affect: mood congruent Speech: normal in rate, volume, and tone Eye Contact: appropriate Psychomotor Activity: appropriate Gait: unable to assess Thought Process: linear, logical, and goal directed  Thought Content/Perception: no hallucinations, delusions, bizarre thinking or behavior reported or observed and no evidence of suicidal and homicidal ideation, plan, and intent Orientation: time, person, place and purpose of appointment Memory/Concentration: memory, attention, language, and fund of knowledge intact  Insight/Judgment: good  Interventions:  Conducted a brief chart review Provided empathic reflections and validation Employed supportive psychotherapy interventions to facilitate reduced distress and to improve coping skills with identified stressors Employed motivational interviewing skills to assess patient's willingness/desire to adhere to recommended medical treatments and assignments Engaged patient in goal setting Engaged patient in problem solving Psychoeducation regarding SMART goals  DSM-5 Diagnosis: 311 (F32.8) Other Specified Depressive Disorder, Emotional Eating Behaviors  Treatment Goal & Progress: During the initial appointment with this provider, the following treatment goal was established: decrease emotional eating. Kamren has demonstrated progress in his goal as evidenced by increased awareness of hunger patterns, increased awareness of triggers for emotional eating and reduction in emotional eating. Tigran also continues to demonstrate willingness to engage in learned skill(s).  Plan: The next appointment will be scheduled in two weeks, which will be via [employer]. The next session will focus on working towards the established treatment goal. Han is scheduled for an initial appointment with Buena Irish with [facility] on July 20, 2019.

## 2019-07-10 ENCOUNTER — Encounter (INDEPENDENT_AMBULATORY_CARE_PROVIDER_SITE_OTHER): Payer: Self-pay | Admitting: Family Medicine

## 2019-07-10 ENCOUNTER — Ambulatory Visit (INDEPENDENT_AMBULATORY_CARE_PROVIDER_SITE_OTHER): Payer: 59 | Admitting: Family Medicine

## 2019-07-10 ENCOUNTER — Other Ambulatory Visit: Payer: Self-pay

## 2019-07-10 ENCOUNTER — Ambulatory Visit (INDEPENDENT_AMBULATORY_CARE_PROVIDER_SITE_OTHER): Payer: Self-pay | Admitting: Bariatrics

## 2019-07-10 VITALS — BP 125/72 | HR 72 | Temp 98.1°F | Ht 68.0 in | Wt 251.0 lb

## 2019-07-10 DIAGNOSIS — Z9189 Other specified personal risk factors, not elsewhere classified: Secondary | ICD-10-CM | POA: Diagnosis not present

## 2019-07-10 DIAGNOSIS — R7303 Prediabetes: Secondary | ICD-10-CM | POA: Diagnosis not present

## 2019-07-10 DIAGNOSIS — E559 Vitamin D deficiency, unspecified: Secondary | ICD-10-CM | POA: Diagnosis not present

## 2019-07-10 DIAGNOSIS — Z6838 Body mass index (BMI) 38.0-38.9, adult: Secondary | ICD-10-CM

## 2019-07-10 MED ORDER — METFORMIN HCL 500 MG PO TABS: 500.0000 mg | ORAL_TABLET | Freq: Two times a day (BID) | ORAL | 0 refills | Status: DC

## 2019-07-10 MED ORDER — VITAMIN D (ERGOCALCIFEROL) 1.25 MG (50000 UNIT) PO CAPS: 50000.0000 [IU] | ORAL_CAPSULE | ORAL | 0 refills | Status: DC

## 2019-07-11 ENCOUNTER — Telehealth: Payer: Self-pay | Admitting: Pulmonary Disease

## 2019-07-11 NOTE — Progress Notes (Signed)
Chief Complaint:   OBESITY Alejandro [name] is here to discuss his progress with his obesity treatment plan along with follow-up of his obesity related diagnoses. Alejandro [name] is on the Category 4 Plan and states he is following his eating plan approximately 90% of the time. Alejandro [name] states he is doing the [employer] class, 9 Round kickboxing and multiple exercises 30 to 45 minutes 4 times per week.  Today's visit was #: 6 Starting weight: 254 lbs Starting date: 04/09/2019 Today's weight: 251 lbs Today's date: 07/10/2019 Total lbs lost to date: 3 Total lbs lost since last in-office visit: 2  Interim History: Alejandro [name] is a bit disappointed in only two pounds of weight loss. He does tend to eat cereal as an evening snack but he does not measure the portion.  Subjective:   Prediabetes  Alejandro [name] has a diagnosis of prediabetes based on his elevated Hgb A1c and was informed this puts him at greater risk of developing diabetes. He is on metformin once daily. Alejandro [name] reports polyphagia. He continues to work on diet and exercise to decrease his risk of diabetes.   Lab Results  Component Value Date   HGBA1C 5.7 (H) 04/09/2019   Lab Results  Component Value Date   INSULIN 26.6 (H) 04/09/2019   Vitamin D deficiency  Alejandro [name]'s Vitamin D level was not at goal (20.1 on 04/09/19). He is currently taking prescription vit D. He denies nausea, vomiting or muscle weakness.  At risk for diabetes mellitus Alejandro [name] is at higher than average risk for developing diabetes due to his obesity and prediabetes.   Assessment/Plan:   Prediabetes  Alejandro [name] will continue to work on weight loss, exercise, and decreasing simple carbohydrates to help decrease the risk of diabetes. Alejandro [name] agrees to increase the dose of metformin to 500 mg two times daily, and a prescription was written today for metformin 500 mg two times daily with meals #60 with no refills. We will check A1c at the next visit.  Vitamin D deficiency  Low Vitamin D level contributes to fatigue and are associated with obesity, breast, and colon cancer. Alejandro [name] agrees to continue to take prescription Vitamin D @50 ,000 IU every week #4 with no refills and he will follow-up for routine testing of Vitamin D, at least 2-3 times per year to avoid over-replacement.  At risk for diabetes mellitus Alejandro [name] was given approximately 15 minutes of diabetes education and counseling today. We discussed intensive lifestyle modifications today with an emphasis on weight loss as well as increasing exercise and decreasing simple carbohydrates in his diet. We also reviewed insulin resistance.  Repetitive spaced learning was employed today to elicit superior memory formation and behavioral change.  Class 2 severe obesity with serious comorbidity and body mass index (BMI) of 38.0 to 38.9 in adult, unspecified obesity type [name]) Alejandro [name] is currently in the action stage of change. As such, his goal is to continue with weight loss efforts. He has agreed to the Category 4 Plan and keeping a food journal and adhering to recommended goals of 1800 to 1900 calories and 110 grams of protein daily.   Exercise goals: Alejandro [name] will continue his current exercise regimen.  Behavioral modification strategies: better snacking choices and planning for success. I encouraged him to snack on protein snacks.  Handouts: 100 calorie snacks  Alejandro [name] has agreed to follow-up with our clinic in 3 weeks. He was informed of the importance of frequent follow-up visits to maximize his success with intensive lifestyle modifications for his multiple  health conditions.   Objective:   Blood pressure 125/72, pulse 72, temperature 98.1 F (36.7 C), temperature source Oral, height 5\' 8"  (1.727 m), weight 251 lb (113.9 kg), SpO2 95 %. Body mass index is 38.16 kg/m.  General: Cooperative, alert, well developed, in no acute distress. HEENT: Conjunctivae and lids unremarkable. Cardiovascular: Regular  rhythm.  Lungs: Normal work of breathing. Neurologic: No focal deficits.   Lab Results  Component Value Date   CREATININE 0.86 04/09/2019   BUN 13 04/09/2019   NA 138 04/09/2019   K 4.8 04/09/2019   CL 99 04/09/2019   CO2 25 04/09/2019   Lab Results  Component Value Date   ALT 66 (H) 04/09/2019   AST 36 04/09/2019   ALKPHOS 65 04/09/2019   BILITOT 0.3 04/09/2019   Lab Results  Component Value Date   HGBA1C 5.7 (H) 04/09/2019   Lab Results  Component Value Date   INSULIN 26.6 (H) 04/09/2019   Lab Results  Component Value Date   TSH 1.830 04/09/2019   Lab Results  Component Value Date   CHOL 302 (H) 04/09/2019   HDL 49 04/09/2019   LDLCALC 169 (H) 04/09/2019   LDLDIRECT 224.0 08/26/2017   TRIG 433 (H) 04/09/2019   CHOLHDL 6 08/26/2017   No results found for: WBC, HGB, HCT, MCV, PLT No results found for: IRON, TIBC, FERRITIN   Ref. Range 04/09/2019 09:20  Vitamin D, 25-Hydroxy Latest Ref Range: 30.0 - 100.0 ng/mL 20.1 (L)    Attestation Statements:   Reviewed by clinician on day of visit: allergies, medications, problem list, medical history, surgical history, family history, social history, and previous encounter notes.  Alejandro [name], am acting as Location manager for [employer], FNP-C.  I have reviewed the above documentation for accuracy and completeness, and I agree with the above. -  Alejandro [name] [employer], FNP-C

## 2019-07-11 NOTE — Telephone Encounter (Signed)
Called and spoke with pt. Pt stated that he would like to be started on cpap. Stated to pt since we hadn't seen him since 01/2018 he would need to be scheduled for an appt. Pt verbalized understanding and has been scheduled for appt 2/9 with AO. Nothing further needed.

## 2019-07-12 ENCOUNTER — Encounter (INDEPENDENT_AMBULATORY_CARE_PROVIDER_SITE_OTHER): Payer: Self-pay | Admitting: Family Medicine

## 2019-07-19 ENCOUNTER — Other Ambulatory Visit: Payer: Self-pay

## 2019-07-19 ENCOUNTER — Encounter (INDEPENDENT_AMBULATORY_CARE_PROVIDER_SITE_OTHER): Payer: Self-pay

## 2019-07-19 ENCOUNTER — Other Ambulatory Visit (INDEPENDENT_AMBULATORY_CARE_PROVIDER_SITE_OTHER): Payer: Self-pay | Admitting: Bariatrics

## 2019-07-19 ENCOUNTER — Ambulatory Visit (INDEPENDENT_AMBULATORY_CARE_PROVIDER_SITE_OTHER): Payer: 59 | Admitting: Psychology

## 2019-07-19 DIAGNOSIS — E559 Vitamin D deficiency, unspecified: Secondary | ICD-10-CM

## 2019-07-19 DIAGNOSIS — F3289 Other specified depressive episodes: Secondary | ICD-10-CM | POA: Diagnosis not present

## 2019-07-20 ENCOUNTER — Ambulatory Visit (INDEPENDENT_AMBULATORY_CARE_PROVIDER_SITE_OTHER): Payer: 59 | Admitting: Psychology

## 2019-07-20 ENCOUNTER — Other Ambulatory Visit (INDEPENDENT_AMBULATORY_CARE_PROVIDER_SITE_OTHER): Payer: Self-pay | Admitting: Bariatrics

## 2019-07-20 DIAGNOSIS — E7849 Other hyperlipidemia: Secondary | ICD-10-CM

## 2019-07-20 DIAGNOSIS — F3289 Other specified depressive episodes: Secondary | ICD-10-CM

## 2019-07-23 NOTE — Progress Notes (Signed)
  Office: [phone]  /  Fax: [phone]    Date: August 06, 2019   Appointment Start Time: 10:31am Duration: 25 minutes Provider: Glennie Isle, Psy.D. Type of Session: Individual Therapy  Location of Patient: Home Location of Provider: Provider's Home Type of Contact: Telepsychological Visit via [employer]  Session Content: Mazin is a 39 y.o. male presenting via [facility] for a follow-up appointment to address the previously established treatment goal of decreasing emotional eating. Today's appointment was a telepsychological visit due to COVID-19. [name] provided verbal consent for today's telepsychological appointment and he is aware he is responsible for securing confidentiality on his end of the session. Prior to proceeding with today's appointment, Jeancarlos's physical location at the time of this appointment was obtained as well a phone number he could be reached at in the event of technical difficulties. [name] and this provider participated in today's telepsychological service.   This provider conducted a brief check-in. Sabin reported, "Everything's going well." He reported he is working toward reducing carbohydrate intake, noting he was switched to the low carb meal plan. Nichalous reported feeling "encouraged" by the new meal plan and changes in thinking patterns with therapeutic services. He reflected on progress to date, recognizing food is utilized to show love and reward himself as well as others. Thus, this provider explored other options for the aforementioned. Moreover, Searle described a reduction in emotional eating. Positive reinforcement was provided. Termination planning was discussed. Oluwatomisin was receptive to a follow-up appointment in 3-4 weeks and an additional follow-up/termination appointment in 3-4 weeks after that. He was encouraged to continue engaging in mindfulness exercises; he agreed. Overall, Sotirios was receptive to today's appointment as evidenced by  openness to sharing, responsiveness to feedback, and willingness to engage in learned skills.  Mental Status Examination:  Appearance: well groomed and appropriate hygiene  Behavior: appropriate to circumstances Mood: euthymic Affect: mood congruent Speech: normal in rate, volume, and tone Eye Contact: appropriate Psychomotor Activity: appropriate Gait: unable to assess Thought Process: linear, logical, and goal directed  Thought Content/Perception: no hallucinations, delusions, bizarre thinking or behavior reported or observed and no evidence of suicidal and homicidal ideation, plan, and intent Orientation: time, person, place and purpose of appointment Memory/Concentration: memory, attention, language, and fund of knowledge intact  Insight/Judgment: good  Interventions:  Conducted a brief chart review Provided empathic reflections and validation Reviewed content from the previous session Provided positive reinforcement Employed supportive psychotherapy interventions to facilitate reduced distress and to improve coping skills with identified stressors Employed motivational interviewing skills to assess patient's willingness/desire to adhere to recommended medical treatments and assignments  Discussed termination planning  DSM-5 Diagnosis: 311 (F32.8) Other Specified Depressive Disorder, Emotional Eating Behaviors  Treatment Goal & Progress: During the initial appointment with this provider, the following treatment goal was established: decrease emotional eating. Merik has demonstrated progress in his goal as evidenced by increased awareness of hunger patterns, increased awareness of triggers for emotional eating and reduction in emotional eating. Euclide also continues to demonstrate willingness to engage in learned skill(s).  Plan: The next appointment will be scheduled in three weeks, which will be via [employer]. The next session will focus on working towards the established  treatment goal.

## 2019-07-24 ENCOUNTER — Encounter: Payer: Self-pay | Admitting: Pulmonary Disease

## 2019-07-24 ENCOUNTER — Other Ambulatory Visit: Payer: Self-pay

## 2019-07-24 ENCOUNTER — Ambulatory Visit (INDEPENDENT_AMBULATORY_CARE_PROVIDER_SITE_OTHER): Payer: 59 | Admitting: Pulmonary Disease

## 2019-07-24 VITALS — BP 120/86 | Temp 97.3°F | Ht 68.0 in | Wt 256.2 lb

## 2019-07-24 DIAGNOSIS — G4719 Other hypersomnia: Secondary | ICD-10-CM | POA: Diagnosis not present

## 2019-07-24 DIAGNOSIS — G4733 Obstructive sleep apnea (adult) (pediatric): Secondary | ICD-10-CM | POA: Diagnosis not present

## 2019-07-24 NOTE — Progress Notes (Signed)
Alejandro [name]    ZO:[id number]    August 22, 1980  Primary Care Physician:Duncan, Elveria Rising, MD  Referring Physician: Tonia Ghent, MD [address] [place],  [place] [address]  Chief complaint:   Snoring Excessive daytime sleepiness Nonrestorative sleep  HPI:y Diagnosed with severe obstructive sleep apnea with oxygen desaturations  Would Covid and stay at home order He did not follow-up to procure CPAP  He continues to snore heavily and is getting concerned about his symptoms He wants to follow-up with CPAP Study was performed 02/21/2018  Symptoms remain about the same of chronic snoring, daytime sleepiness Bedtime is about the same  Usually goes to bed about 930 to 10:30 PM, wakes up between 630 to 7:00 Gets up about twice during the night Weight has fluctuated over the years Size 17 neck size He has headaches, dry mouth Very loud snoring Comorbidities include hypertension, hypercholesterolemia-increased cardiovascular risk if sleep apnea is not treated  Pets: No pets Occupation: Works as a Human resources officer Exposures: No significant occupational exposures Smoking history: Never smoker No significant family history of sleep disordered breathing  Outpatient Encounter Medications as of 07/24/2019  Medication Sig  . atorvastatin (LIPITOR) 20 MG tablet TAKE 1 TABLET(20 MG) BY MOUTH DAILY  . escitalopram (LEXAPRO) 20 MG tablet Take 20 mg by mouth daily.  . metFORMIN (GLUCOPHAGE) 500 MG tablet Take 1 tablet (500 mg total) by mouth 2 (two) times daily with a meal.  . Vitamin D, Ergocalciferol, (DRISDOL) 1.25 MG (50000 UNIT) CAPS capsule Take 1 capsule (50,000 Units total) by mouth every 7 (seven) days.   No facility-administered encounter medications on file as of 07/24/2019.    Allergies as of 07/24/2019  . (No Known Allergies)    Past Medical History:  Diagnosis Date  . ADD (attention deficit disorder)   . Alcohol abuse   . Anxiety   . Back pain   .  Bursitis of both knees   . Degenerative disc disease, lumbar   . Depression   . Drug use   . Fatty liver   . Floppy eyelid syndrome   . GERD (gastroesophageal reflux disease)   . Hyperlipidemia   . Hypertension   . Obesity   . Plantar fasciitis    followed by ortho  . Sleep apnea   . SOB (shortness of breath)     Past Surgical History:  Procedure Laterality Date  . US ECHOCARDIOGRAPHY  08/2010   normal, no LVH    Family History  Problem Relation Age of Onset  . Hyperlipidemia Father   . Anxiety disorder Father   . CAD Paternal Grandfather 73  . Diabetes Mother   . Depression Mother   . Sleep apnea Mother   . Alcoholism Mother   . Obesity Mother   . Hypertension Maternal Grandfather   . CAD Maternal Grandfather 30       MI, CABG  . Colon cancer Neg Hx   . Prostate cancer Neg Hx     Social History   Socioeconomic History  . Marital status: Married    Spouse name: Judson Roch  . Number of children: Not on file  . Years of education: Not on file  . Highest education level: Not on file  Occupational History  . 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 Smoking status: Never Smoker  . Smokeless tobacco: Never Used  Substance and Sexual Activity  .  Alcohol use: No  . Drug use: No  . Sexual activity: Not on file  Other Topics Concern  . 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 Difficulty of Paying Living Expenses: Not on file  Food Insecurity:   . Worried About Charity fundraiser in the Last Year: Not on file  . Ran Out of Food in the Last Year: Not on file  Transportation Needs:   . Lack of Transportation (Medical): Not on file  . Lack of Transportation (Non-Medical): Not on file  Physical Activity:   . Days of Exercise per Week: Not on file    . Minutes of Exercise per Session: Not on file  Stress:   . Feeling of Stress : Not on file  Social Connections:   . Frequency of Communication with Friends and Family: Not on file  . Frequency of Social Gatherings with Friends and Family: Not on file  . Attends Religious Services: Not on file  . Active Member of Clubs or Organizations: Not on file  . Attends Archivist Meetings: Not on file  . Marital Status: Not on file  Intimate Partner Violence:   . Fear of Current or Ex-Partner: Not on file  . Emotionally Abused: Not on file  . Physically Abused: Not on file  . Sexually Abused: Not on file    Review of Systems  HENT: Negative.   Eyes:       History of floppy eyelid  Respiratory: Positive for apnea and shortness of breath.   Endocrine: Negative.   Genitourinary: Negative.   Musculoskeletal: Negative.   Skin: Negative.   Allergic/Immunologic: Negative.   Neurological: Negative.   Psychiatric/Behavioral: Positive for sleep disturbance.    Vitals:   07/24/19 1618  BP: 120/86  Temp: (!) 97.3 F (36.3 C)  SpO2: 95%     Physical Exam  Constitutional: He appears well-developed and well-nourished.  HENT:  Head: Normocephalic.  Eyes: Pupils are equal, round, and reactive to light. Conjunctivae are normal. Right eye exhibits no discharge. Left eye exhibits no discharge.  Neck: No tracheal deviation present. No thyromegaly present.  Cardiovascular: Normal rate and regular rhythm.  Pulmonary/Chest: Effort normal and breath sounds normal. No respiratory distress. He has no wheezes.  Abdominal: Soft. Bowel sounds are normal. He exhibits no distension. There is no abdominal tenderness.  Musculoskeletal:     Cervical back: Normal range of motion and neck supple.   Data Reviewed:  Sleep study revealed severe obstructive sleep apnea with severe oxygen desaturations Recommendation at the time was an in lab titration study  Assessment:  .  Obstructive sleep  apnea  .  Excessive daytime sleepiness  .  Snoring  .  Morbid obesity  .  He is currently dieting and watching his weight and continue to focus on this  Pathophysiology of sleep disordered breathing discussed Treatment options for sleep disordered breathing discussed  Plan/Recommendations: .  An option of treatment will be starting the patient on auto titrating CPAP  .  Obtain an oximetry while on CPAP  .  Oxygen supplementation as needed  .  Continue weight loss efforts We will follow-up in about 3 months Sherrilyn Rist MD [facility] 07/24/2019, 4:33 PM  CC: Tonia Ghent, MD

## 2019-07-24 NOTE — Patient Instructions (Signed)
If study is still valid  Auto titrating CPAP 5-20  If study is no longer valid  Repeat home sleep study with plan for auto titrating CPAP  I will see him in about 3 months

## 2019-07-31 ENCOUNTER — Encounter (INDEPENDENT_AMBULATORY_CARE_PROVIDER_SITE_OTHER): Payer: Self-pay | Admitting: Family Medicine

## 2019-07-31 ENCOUNTER — Ambulatory Visit (INDEPENDENT_AMBULATORY_CARE_PROVIDER_SITE_OTHER): Payer: 59 | Admitting: Family Medicine

## 2019-07-31 ENCOUNTER — Other Ambulatory Visit: Payer: Self-pay

## 2019-07-31 VITALS — BP 118/70 | HR 79 | Temp 97.5°F | Ht 68.0 in | Wt 250.0 lb

## 2019-07-31 DIAGNOSIS — Z6838 Body mass index (BMI) 38.0-38.9, adult: Secondary | ICD-10-CM

## 2019-07-31 DIAGNOSIS — R7303 Prediabetes: Secondary | ICD-10-CM | POA: Diagnosis not present

## 2019-07-31 DIAGNOSIS — Z9189 Other specified personal risk factors, not elsewhere classified: Secondary | ICD-10-CM | POA: Diagnosis not present

## 2019-07-31 DIAGNOSIS — E559 Vitamin D deficiency, unspecified: Secondary | ICD-10-CM

## 2019-07-31 DIAGNOSIS — E782 Mixed hyperlipidemia: Secondary | ICD-10-CM

## 2019-07-31 MED ORDER — ATORVASTATIN CALCIUM 20 MG PO TABS: ORAL_TABLET | ORAL | 0 refills | Status: DC

## 2019-07-31 MED ORDER — METFORMIN HCL 500 MG PO TABS: 500.0000 mg | ORAL_TABLET | Freq: Three times a day (TID) | ORAL | 0 refills | Status: DC

## 2019-07-31 NOTE — Progress Notes (Signed)
Chief Complaint:   Alejandro [name] is here to discuss his progress with his Alejandro treatment plan along with follow-up of his Alejandro related diagnoses. Alejandro [name] is on the Category 4 Plan and states he is following his eating plan approximately 70 to 80% of the time. Alejandro [name] states he is kick boxing, running and lifting 40 minutes 4 times per week.  Today's visit was #: 7 Starting weight: 254 lbs Starting date: 04/09/2019 Today's weight: 250 lbs Today's date: 07/31/2019 Total lbs lost to date: 4 Total lbs lost since last in-office visit: 1  Interim History: Alejandro [name] is working on having protein snacks, rather than carbs. He tends to go over on extra calories with carbohydrates.  Subjective:   Prediabetes  Alejandro [name] has a diagnosis of prediabetes. Metformin was increased to two times daily at the last visit. He feels this helps with hunger, and he would like to increase the dose further. He continues to work on diet and exercise to decrease his risk of diabetes.   Lab Results  Component Value Date   HGBA1C 5.7 (H) 04/09/2019   Lab Results  Component Value Date   INSULIN 26.6 (H) 04/09/2019   Other hyperlipidemia  Alejandro [name] has hyperlipidemia and his last triglycerides were very high at 435 (04/09/19). His last LDL was 169 on 04/09/19. Alejandro [name]'s HDL was normal at 49. He has been trying to improve his cholesterol levels with intensive lifestyle modification including a low saturated fat diet, exercise and weight loss. He denies any chest pain or shortness of breath.  Lab Results  Component Value Date   ALT 66 (H) 04/09/2019   AST 36 04/09/2019   ALKPHOS 65 04/09/2019   BILITOT 0.3 04/09/2019   Lab Results  Component Value Date   CHOL 302 (H) 04/09/2019   HDL 49 04/09/2019   LDLCALC 169 (H) 04/09/2019   LDLDIRECT 224.0 08/26/2017   TRIG 433 (H) 04/09/2019   CHOLHDL 6 08/26/2017   Vitamin D deficiency  Alejandro [name]'s Vitamin D level was low at 20.1 on 04/09/19. He is  currently taking prescription vit D. He denies nausea, vomiting or muscle weakness.  At risk for heart disease Alejandro [name] is at a higher than average risk for cardiovascular disease due to Alejandro, hyperlipidemia and prediabetes.  The 10-year ASCVD risk score Alejandro Bussing DC Brooke Bonito., et al., 2013) is: 1.2%   Values used to calculate the score:     Age: 39 years     Sex: Male     Is Non-Hispanic African American: No     Diabetic: No     Tobacco smoker: No     Systolic Blood Pressure: 123456 mmHg     Is BP treated: No     HDL Cholesterol: 52 mg/dL     Total Cholesterol: 230 mg/dL  Lab Results  Component Value Date   CHOL 230 (H) 07/31/2019   HDL 52 07/31/2019   LDLCALC 114 (H) 07/31/2019   LDLDIRECT 224.0 08/26/2017   TRIG 372 (H) 07/31/2019   CHOLHDL 6 08/26/2017     Assessment/Plan:   Prediabetes  Quido will continue to work on weight loss, exercise, and decreasing simple carbohydrates to help decrease the risk of diabetes. Alejandro [name] agreed to increase the dose of metformin and a prescription was written today for metformin 500 mg three times daily with meals #90 with no refills. We will check fasting glucose, insulin and A1c today.  Other hyperlipidemia  Cardiovascular risk and specific lipid/LDL goals reviewed.  We discussed several  lifestyle modifications today and Alejandro [name] will continue to work on diet, exercise and weight loss efforts. Alejandro [name] agreed to continue Atorvastatin 20 mg daily #30 with no refills. We will check fasting lipid panel today. Orders and follow up as documented in patient record.    Vitamin D deficiency  Low Vitamin D level contributes to fatigue and are associated with Alejandro, breast, and colon cancer. Alejandro [name] will continue to take prescription Vitamin D @50 ,000 IU every week and we will check vitamin D level today. He will follow-up for routine testing of Vitamin D, at least 2-3 times per year to avoid over-replacement.  At risk for heart disease Alejandro [name] was given  approximately 15 minutes of coronary artery disease prevention counseling today. He is 39 y.o. male and has risk factors for heart disease including Alejandro, hyperlipidemia and prediabetes. We discussed intensive lifestyle modifications today with an emphasis on specific weight loss instructions and strategies.   Repetitive spaced learning was employed today to elicit superior memory formation and behavioral change.  Class 2 severe Alejandro with serious comorbidity and body mass index (BMI) of 38.0 to 38.9 in adult, unspecified Alejandro type Alejandro [name]) Alejandro [name] is currently in the action stage of change. As such, his goal is to continue with weight loss efforts. He has agreed to the Category 4 Plan.   Exercise goals: Alejandro [name] will continue kick boxing, running and lifting for 40 minutes, 4 times per week.  Behavioral modification strategies: decreasing simple carbohydrates and planning for success. Alejandro [name] would like to reduce carbohydrates by only snacking on protein. We discussed starting the low carb plan at the next visit.  Alejandro [name] has agreed to follow-up with our clinic in 2 weeks. He was informed of the importance of frequent follow-up visits to maximize his success with intensive lifestyle modifications for his multiple health conditions.   Alejandro [name] was informed we would discuss his lab results at his next visit unless there is a critical issue that needs to be addressed sooner. Alejandro [name] agreed to keep his next visit at the agreed upon time to discuss these results.  Objective:   Blood pressure 118/70, pulse 79, temperature (!) 97.5 F (36.4 C), temperature source Oral, height 5\' 8"  (1.727 m), weight 250 lb (113.4 kg), SpO2 96 %. Body mass index is 38.01 kg/m.  General: Cooperative, alert, well developed, in no acute distress. HEENT: Conjunctivae and lids unremarkable. Cardiovascular: Regular rhythm.  Lungs: Normal work of breathing. Neurologic: No focal deficits.   Lab Results  Component  Value Date   CREATININE 0.86 04/09/2019   BUN 13 04/09/2019   NA 138 04/09/2019   K 4.8 04/09/2019   CL 99 04/09/2019   CO2 25 04/09/2019   Lab Results  Component Value Date   ALT 66 (H) 04/09/2019   AST 36 04/09/2019   ALKPHOS 65 04/09/2019   BILITOT 0.3 04/09/2019   Lab Results  Component Value Date   HGBA1C 5.7 (H) 04/09/2019   Lab Results  Component Value Date   INSULIN 26.6 (H) 04/09/2019   Lab Results  Component Value Date   TSH 1.830 04/09/2019   Lab Results  Component Value Date   CHOL 302 (H) 04/09/2019   HDL 49 04/09/2019   LDLCALC 169 (H) 04/09/2019   LDLDIRECT 224.0 08/26/2017   TRIG 433 (H) 04/09/2019   CHOLHDL 6 08/26/2017   No results found for: WBC, HGB, HCT, MCV, PLT No results found for: IRON, TIBC, FERRITIN   Ref. Range 04/09/2019 09:20  Vitamin D, 25-Hydroxy Latest Ref  Range: 30.0 - 100.0 ng/mL 20.1 (L)    Attestation Statements:   Reviewed by clinician on day of visit: allergies, medications, problem list, medical history, surgical history, family history, social history, and previous encounter notes.  Corey Skains, am acting as Location manager for [employer], FNP-C.  I have reviewed the above documentation for accuracy and completeness, and I agree with the above. -  Khia Dieterich [employer], FNP-C

## 2019-07-31 NOTE — Progress Notes (Signed)
The 10-year ASCVD risk score Mikey Bussing DC Brooke Bonito., et al., 2013) is: 2.3%   Values used to calculate the score:     Age: 39 years     Sex: Male     Is Non-Hispanic African American: No     Diabetic: No     Tobacco smoker: No     Systolic Blood Pressure: 123456 mmHg     Is BP treated: No     HDL Cholesterol: 49 mg/dL     Total Cholesterol: 302 mg/dL

## 2019-08-01 LAB — COMPREHENSIVE METABOLIC PANEL
ALT: 64 IU/L — ABNORMAL HIGH (ref 0–44)
AST: 30 IU/L (ref 0–40)
Albumin/Globulin Ratio: 2 (ref 1.2–2.2)
Albumin: 4.8 g/dL (ref 4.0–5.0)
Alkaline Phosphatase: 67 IU/L (ref 39–117)
BUN/Creatinine Ratio: 18 (ref 9–20)
BUN: 16 mg/dL (ref 6–24)
Bilirubin Total: <0.2 mg/dL (ref 0.0–1.2)
CO2: 23 mmol/L (ref 20–29)
Calcium: 9.7 mg/dL (ref 8.7–10.2)
Chloride: 104 mmol/L (ref 96–106)
Creatinine, Ser: 0.88 mg/dL (ref 0.76–1.27)
GFR calc Af Amer: 124 mL/min/{1.73_m2} (ref >59)
GFR calc non Af Amer: 107 mL/min/{1.73_m2} (ref >59)
Globulin, Total: 2.4 g/dL (ref 1.5–4.5)
Glucose: 99 mg/dL (ref 65–99)
Potassium: 4.9 mmol/L (ref 3.5–5.2)
Sodium: 143 mmol/L (ref 134–144)
Total Protein: 7.2 g/dL (ref 6.0–8.5)

## 2019-08-01 LAB — LIPID PANEL WITH LDL/HDL RATIO
Cholesterol, Total: 230 mg/dL — ABNORMAL HIGH (ref 100–199)
HDL: 52 mg/dL (ref >39)
LDL Chol Calc (NIH): 114 mg/dL — ABNORMAL HIGH (ref 0–99)
LDL/HDL Ratio: 2.2 ratio (ref 0.0–3.6)
Triglycerides: 372 mg/dL — ABNORMAL HIGH (ref 0–149)
VLDL Cholesterol Cal: 64 mg/dL — ABNORMAL HIGH (ref 5–40)

## 2019-08-01 LAB — INSULIN, RANDOM: INSULIN: 32.3 u[IU]/mL — ABNORMAL HIGH (ref 2.6–24.9)

## 2019-08-01 LAB — VITAMIN D 25 HYDROXY (VIT D DEFICIENCY, FRACTURES): Vit D, 25-Hydroxy: 21.8 ng/mL — ABNORMAL LOW (ref 30.0–100.0)

## 2019-08-01 LAB — HEMOGLOBIN A1C
Est. average glucose Bld gHb Est-mCnc: 117 mg/dL
Hgb A1c MFr Bld: 5.7 % — ABNORMAL HIGH (ref 4.8–5.6)

## 2019-08-03 ENCOUNTER — Ambulatory Visit (INDEPENDENT_AMBULATORY_CARE_PROVIDER_SITE_OTHER): Payer: 59 | Admitting: Psychology

## 2019-08-03 DIAGNOSIS — F3289 Other specified depressive episodes: Secondary | ICD-10-CM | POA: Diagnosis not present

## 2019-08-05 ENCOUNTER — Encounter (INDEPENDENT_AMBULATORY_CARE_PROVIDER_SITE_OTHER): Payer: Self-pay | Admitting: Family Medicine

## 2019-08-06 ENCOUNTER — Ambulatory Visit (INDEPENDENT_AMBULATORY_CARE_PROVIDER_SITE_OTHER): Payer: 59 | Admitting: Psychology

## 2019-08-06 ENCOUNTER — Other Ambulatory Visit: Payer: Self-pay

## 2019-08-06 DIAGNOSIS — F5089 Other specified eating disorder: Secondary | ICD-10-CM | POA: Diagnosis not present

## 2019-08-06 DIAGNOSIS — F3289 Other specified depressive episodes: Secondary | ICD-10-CM | POA: Diagnosis not present

## 2019-08-13 NOTE — Progress Notes (Signed)
Office: [phone]  /  Fax: [phone]    Date: August 27, 2019   Appointment Start Time: 10:30am Duration: 26 minutes Provider: Glennie Isle, Psy.D. Type of Session: Individual Therapy  Location of Patient: Work Biomedical scientist of Provider: Provider's Home Type of Contact: Telepsychological Visit via [employer]  Session Content: Alejandro [name] is a 39 y.o. male presenting via [place] for a follow-up appointment to address the previously established treatment goal of decreasing emotional eating. Today's appointment was a telepsychological visit due to COVID-19. Alejandro [name] provided verbal consent for today's telepsychological appointment and he is aware he is responsible for securing confidentiality on his end of the session. Prior to proceeding with today's appointment, Alejandro [name]'s physical location at the time of this appointment was obtained as well a phone number he could be reached at in the event of technical difficulties. Alejandro [name] and this provider participated in today's telepsychological service.   This provider conducted a brief check-in. Alejandro [name] stated, "I'm great. Everything is good." He shared about recent weight loss. Alejandro [name] reported a reduction in nighttime eating, noting he has not engaged in emotional eating since the last appointment with this provider. Alejandro [name] shared about pain due to a degenerative disc. Alejandro [name] pain was assessed on a scale of 0-10, where 0 is no pain and 10 is the worst pain ever experienced. He noted his pain was "3-4." He was led through a mindfulness exercise focusing on the body. His experience was processed and pain was re-assessed using the same scale. He noted his pain was "1 or 2" after the exercise. Alejandro [name] provided verbal consent during today's appointment for this provider to send the handout for today's exercise via e-mail. This provider also discussed an option on YouTube. Alejandro [name] was receptive to today's appointment as evidenced by openness to sharing,  responsiveness to feedback, and willingness to continue engaging in learned skills.  Mental Status Examination:  Appearance: well groomed and appropriate hygiene  Behavior: appropriate to circumstances Mood: euthymic Affect: mood congruent Speech: normal in rate, volume, and tone Eye Contact: appropriate Psychomotor Activity: appropriate Gait: unable to assess Thought Process: linear, logical, and goal directed  Thought Content/Perception: no hallucinations, delusions, bizarre thinking or behavior reported or observed and no evidence of suicidal and homicidal ideation, plan, and intent Orientation: time, person, place and purpose of appointment Memory/Concentration: memory, attention, language, and fund of knowledge intact  Insight/Judgment: good  Interventions:  Conducted a brief chart review Provided empathic reflections and validation Employed supportive psychotherapy interventions to facilitate reduced distress and to improve coping skills with identified stressors Employed motivational interviewing skills to assess patient's willingness/desire to adhere to recommended medical treatments and assignments Engaged patient in mindfulness exercise(s) Employed acceptance and commitment interventions to emphasize mindfulness and acceptance without struggle Assessed pain  DSM-5 Diagnosis(es): 311 (F32.8) Other Specified Depressive Disorder, Emotional Eating Behaviors  Treatment Goal & Progress: During the initial appointment with this provider, the following treatment goal was established: decrease emotional eating. Alejandro [name] demonstrated progress in his goal as evidenced by increased awareness of hunger patterns, increased awareness of triggers for emotional eating and reduction in emotional eating. Alejandro [name] also continues to demonstrate willingness to engage in learned skill(s).  Plan: Alejandro [name] declined future appointments with this provider based on his progress, noting his next appointment  with his other therapist is this week. He acknowledged understanding that he may request a follow-up appointment with this provider in the future as long as he is still established with the clinic. No further follow-up planned by this provider.

## 2019-08-14 ENCOUNTER — Ambulatory Visit (INDEPENDENT_AMBULATORY_CARE_PROVIDER_SITE_OTHER): Payer: 59 | Admitting: Family Medicine

## 2019-08-14 ENCOUNTER — Encounter (INDEPENDENT_AMBULATORY_CARE_PROVIDER_SITE_OTHER): Payer: Self-pay | Admitting: Family Medicine

## 2019-08-14 ENCOUNTER — Other Ambulatory Visit: Payer: Self-pay

## 2019-08-14 VITALS — BP 130/74 | HR 70 | Temp 97.8°F | Ht 68.0 in | Wt 243.0 lb

## 2019-08-14 DIAGNOSIS — F3289 Other specified depressive episodes: Secondary | ICD-10-CM | POA: Diagnosis not present

## 2019-08-14 DIAGNOSIS — Z9189 Other specified personal risk factors, not elsewhere classified: Secondary | ICD-10-CM

## 2019-08-14 DIAGNOSIS — R7303 Prediabetes: Secondary | ICD-10-CM | POA: Diagnosis not present

## 2019-08-14 DIAGNOSIS — Z6837 Body mass index (BMI) 37.0-37.9, adult: Secondary | ICD-10-CM

## 2019-08-14 MED ORDER — BUPROPION HCL ER (SR) 150 MG PO TB12: 150.0000 mg | ORAL_TABLET | Freq: Every day | ORAL | 0 refills | Status: DC

## 2019-08-14 NOTE — Progress Notes (Signed)
Chief Complaint:   OBESITY Alejandro [name] is here to discuss his progress with his obesity treatment plan along with follow-up of his obesity related diagnoses. Alejandro [name] is on following a lower carbohydrate, vegetable and lean protein rich diet plan and states he is following his eating plan approximately 100% of Alejandro time. Alejandro [name] states he is going to Alejandro gym for 30-45 minutes 3 times per week.  Today's visit was #: 8 Starting weight: 254 lbs Starting date: 04/09/2019 Today's weight: 243 lbs Today's date: 08/14/2019 Total lbs lost to date: 11 Total lbs lost since last in-office visit: 7  Interim History: Alejandro [name] has started Alejandro low carbohydrate plan, and he has done quite well. His hunger is satisfied. He is keeping net carbohydrates under 20 and following Atkins plan. He is eating plenty of vegetables and he reports that he needs to do better with water.  Subjective:   1. Pre-diabetes Alejandro [name]'s last A1c was 5.7. We increased his dose of metformin to TID last visit. He reports it has helped a great deal with hunger.  2. Other depression, with emotional eating Alejandro [name] struggles with emotional eating on Alejandro weekends and in Alejandro afternoon and night. He is seeing Dr. Mallie Mussel.  3. At risk for dehydration Alejandro [name] is at risk of dehydration due to inadequate water intake.   Assessment/Plan:   1. Pre-diabetes Alejandro [name] will continue to work on weight loss, diet, exercise, and decreasing simple carbohydrates to help decrease Alejandro risk of diabetes. Alejandro [name] agreed to continue metformin, and will continue to monitor.  2. Other depression, with emotional eating Behavior modification techniques were discussed today to help Alejandro [name] deal with his emotional/non-hunger eating behaviors. Alejandro [name] agreed to start bupropion 150 mg daily with no refills. Orders and follow up as documented in patient record.   - buPROPion (WELLBUTRIN SR) 150 MG 12 hr tablet; Take 1 tablet (150 mg total) by mouth daily.  Dispense:  30 tablet; Refill: 0  3. At risk for dehydration Alejandro [name] was given approximately 15 minutes dehydration prevention counseling today. Alejandro [name] is at risk for dehydration due to inadequate water intake, weight loss following a low carb plan. He was encouraged to hydrate and monitor fluid status to avoid dehydration as well as weight loss plateaus.   4. Class 2 severe obesity with serious comorbidity and body mass index (BMI) of 37.0 to 37.9 in adult, unspecified obesity type Alejandro [name]) Alejandro [name] is currently in Alejandro action stage of change. As such, his goal is to continue with weight loss efforts. He has agreed to following a lower carbohydrate, vegetable and lean protein rich diet plan.   Exercise goals: Alejandro [name] is to continue his current exercise regimen as is.  Behavioral modification strategies: increasing water intake and planning for success.  Alejandro [name] has agreed to follow-up with our clinic in 2 weeks. He was informed of Alejandro importance of frequent follow-up visits to maximize his success with intensive lifestyle modifications for his multiple health conditions.   Objective:   Blood pressure 130/74, pulse 70, temperature 97.8 F (36.6 C), temperature source Oral, height 5\' 8"  (1.727 m), weight 243 lb (110.2 kg), SpO2 98 %. Body mass index is 36.95 kg/m.  General: Cooperative, alert, well developed, in no acute distress. HEENT: Conjunctivae and lids unremarkable. Cardiovascular: Regular rhythm.  Lungs: Normal work of breathing. Neurologic: No focal deficits.   Lab Results  Component Value Date   CREATININE 0.88 07/31/2019   BUN 16 07/31/2019   NA 143 07/31/2019   K 4.9 07/31/2019  CL 104 07/31/2019   CO2 23 07/31/2019   Lab Results  Component Value Date   ALT 64 (H) 07/31/2019   AST 30 07/31/2019   ALKPHOS 67 07/31/2019   BILITOT <0.2 07/31/2019   Lab Results  Component Value Date   HGBA1C 5.7 (H) 07/31/2019   HGBA1C 5.7 (H) 04/09/2019   Lab Results  Component Value Date    INSULIN 32.3 (H) 07/31/2019   INSULIN 26.6 (H) 04/09/2019   Lab Results  Component Value Date   TSH 1.830 04/09/2019   Lab Results  Component Value Date   CHOL 230 (H) 07/31/2019   HDL 52 07/31/2019   LDLCALC 114 (H) 07/31/2019   LDLDIRECT 224.0 08/26/2017   TRIG 372 (H) 07/31/2019   CHOLHDL 6 08/26/2017   No results found for: WBC, HGB, HCT, MCV, PLT No results found for: IRON, TIBC, FERRITIN  Attestation Statements:   Reviewed by clinician on day of visit: allergies, medications, problem list, medical history, surgical history, family history, social history, and previous encounter notes.   Wilhemena Durie, am acting as Location manager for [employer], FNP-C.  I have reviewed Alejandro above documentation for accuracy and completeness, and I agree with Alejandro above. -  Georgianne Fick, FNP

## 2019-08-15 ENCOUNTER — Encounter (INDEPENDENT_AMBULATORY_CARE_PROVIDER_SITE_OTHER): Payer: Self-pay | Admitting: Family Medicine

## 2019-08-15 DIAGNOSIS — F32A Depression, unspecified: Secondary | ICD-10-CM | POA: Insufficient documentation

## 2019-08-15 DIAGNOSIS — F418 Other specified anxiety disorders: Secondary | ICD-10-CM | POA: Insufficient documentation

## 2019-08-15 DIAGNOSIS — F329 Major depressive disorder, single episode, unspecified: Secondary | ICD-10-CM | POA: Insufficient documentation

## 2019-08-17 ENCOUNTER — Ambulatory Visit: Payer: 59 | Admitting: Psychology

## 2019-08-27 ENCOUNTER — Ambulatory Visit (INDEPENDENT_AMBULATORY_CARE_PROVIDER_SITE_OTHER): Payer: 59 | Admitting: Psychology

## 2019-08-27 ENCOUNTER — Other Ambulatory Visit: Payer: Self-pay

## 2019-08-27 DIAGNOSIS — F5089 Other specified eating disorder: Secondary | ICD-10-CM | POA: Diagnosis not present

## 2019-08-27 DIAGNOSIS — F3289 Other specified depressive episodes: Secondary | ICD-10-CM | POA: Diagnosis not present

## 2019-08-28 ENCOUNTER — Encounter (INDEPENDENT_AMBULATORY_CARE_PROVIDER_SITE_OTHER): Payer: Self-pay | Admitting: Family Medicine

## 2019-08-28 ENCOUNTER — Other Ambulatory Visit: Payer: Self-pay

## 2019-08-28 ENCOUNTER — Other Ambulatory Visit (INDEPENDENT_AMBULATORY_CARE_PROVIDER_SITE_OTHER): Payer: Self-pay | Admitting: Family Medicine

## 2019-08-28 ENCOUNTER — Ambulatory Visit (INDEPENDENT_AMBULATORY_CARE_PROVIDER_SITE_OTHER): Payer: 59 | Admitting: Family Medicine

## 2019-08-28 VITALS — BP 122/77 | HR 68 | Temp 97.4°F | Ht 68.0 in | Wt 235.0 lb

## 2019-08-28 DIAGNOSIS — Z6835 Body mass index (BMI) 35.0-35.9, adult: Secondary | ICD-10-CM

## 2019-08-28 DIAGNOSIS — Z9189 Other specified personal risk factors, not elsewhere classified: Secondary | ICD-10-CM

## 2019-08-28 DIAGNOSIS — E559 Vitamin D deficiency, unspecified: Secondary | ICD-10-CM | POA: Diagnosis not present

## 2019-08-28 DIAGNOSIS — R7303 Prediabetes: Secondary | ICD-10-CM | POA: Diagnosis not present

## 2019-08-28 MED ORDER — METFORMIN HCL 500 MG PO TABS: 500.0000 mg | ORAL_TABLET | Freq: Three times a day (TID) | ORAL | 0 refills | Status: DC

## 2019-08-28 MED ORDER — VITAMIN D (ERGOCALCIFEROL) 1.25 MG (50000 UNIT) PO CAPS: 50000.0000 [IU] | ORAL_CAPSULE | ORAL | 0 refills | Status: DC

## 2019-08-28 NOTE — Progress Notes (Signed)
Chief Complaint:   OBESITY Alejandro [name] is here to discuss his progress with his obesity treatment plan along with follow-up of his obesity related diagnoses. Alejandro [name] is on following a lower carbohydrate, vegetable and lean protein rich diet plan and states he is following his eating plan approximately 100% of the time. Alejandro [name] states he is doing 0 minutes 0 times per week.  Today's visit was #: 9 Starting weight: 254 lbs Starting date: 04/09/2019 Today's weight: 235 lbs Today's date: 08/28/2019 Total lbs lost to date: 19 Total lbs lost since last in-office visit: 8  Interim History: Alejandro [name] is doing very well on his Low carbohydrate plan. He is eating some nuts but no berries. He is trying to stay under 20 net carbohydrates daily. He is getting adequate vegetables and water. He has not been exercising due to back pain.  Subjective:   1. Vitamin D deficiency Alejandro [name] Vit D level is not at goal. Last Vit D was 21.8 on 07/31/2019. He is on weekly prescription Vit D.  2. Pre-diabetes Alejandro [name] A1c was 5.7. He is on metformin TID, and he feels it decreases his appetite. Lab Results  Component Value Date   HGBA1C 5.7 (H) 07/31/2019    3. At risk for impaired metabolic function Alejandro [name] is at increased risk for impaired metabolic function due to high visceral fat. Alejandro [name] visceral fat was 16 at his initial office visits which increased his risk for comorbidities such as diabetes and coronary artery disease. He has metabolic syndrome (obesity, large waist, pre-diabetes, and high triglycerides).  Assessment/Plan:   1. Vitamin D deficiency Low Vitamin D level contributes to fatigue and are associated with obesity, breast, and colon cancer. We will refill prescription Vitamin D for 1 month. Alejandro [name] will follow-up for routine testing of Vitamin D, at least 2-3 times per year to avoid over-replacement.  - Vitamin D, Ergocalciferol, (DRISDOL) 1.25 MG (50000 UNIT) CAPS capsule; Take 1 capsule  (50,000 Units total) by mouth every 7 (seven) days.  Dispense: 4 capsule; Refill: 0  2. Pre-diabetes Alejandro [name] will continue to work on weight loss, exercise, and decreasing simple carbohydrates to help decrease the risk of diabetes. We will refill metformin for 1 month.  - metFORMIN (GLUCOPHAGE) 500 MG tablet; Take 1 tablet (500 mg total) by mouth 3 (three) times daily with meals.  Dispense: 90 tablet; Refill: 0  3. At risk for impaired metabolic function Alejandro [name] was given approximately 15 minutes of impaired  metabolic function prevention counseling today. We discussed intensive lifestyle modifications today with an emphasis on specific nutrition and exercise instructions and strategies.   Repetitive spaced learning was employed today to elicit superior memory formation and behavioral change.  4. Class 2 severe obesity with serious comorbidity and body mass index (BMI) of 35.0 to 35.9 in adult, unspecified obesity type Alejandro [name]) Alejandro [name] is currently in the action stage of change. As such, his goal is to continue with weight loss efforts. He has agreed to following a lower carbohydrate, vegetable and lean protein rich diet plan.   We discussed shake options with more protein than Atkins shakes.  Exercise goals: Alejandro [name] will slowly begin exercising as his back improves and start flexibility exercises.  Behavioral modification strategies: meal planning and cooking strategies.  Alejandro [name] has agreed to follow-up with our clinic in 2 weeks. He was informed of the importance of frequent follow-up visits to maximize his success with intensive lifestyle modifications for his multiple health conditions.   Objective:   Blood pressure 122/77, pulse  68, temperature (!) 97.4 F (36.3 C), temperature source Oral, height 5\' 8"  (1.727 m), weight 235 lb (106.6 kg), SpO2 98 %. Body mass index is 35.73 kg/m.  General: Cooperative, alert, well developed, in no acute distress. HEENT: Conjunctivae and lids  unremarkable. Cardiovascular: Regular rhythm.  Lungs: Normal work of breathing. Neurologic: No focal deficits.   Lab Results  Component Value Date   CREATININE 0.88 07/31/2019   BUN 16 07/31/2019   NA 143 07/31/2019   K 4.9 07/31/2019   CL 104 07/31/2019   CO2 23 07/31/2019   Lab Results  Component Value Date   ALT 64 (H) 07/31/2019   AST 30 07/31/2019   ALKPHOS 67 07/31/2019   BILITOT <0.2 07/31/2019   Lab Results  Component Value Date   HGBA1C 5.7 (H) 07/31/2019   HGBA1C 5.7 (H) 04/09/2019   Lab Results  Component Value Date   INSULIN 32.3 (H) 07/31/2019   INSULIN 26.6 (H) 04/09/2019   Lab Results  Component Value Date   TSH 1.830 04/09/2019   Lab Results  Component Value Date   CHOL 230 (H) 07/31/2019   HDL 52 07/31/2019   LDLCALC 114 (H) 07/31/2019   LDLDIRECT 224.0 08/26/2017   TRIG 372 (H) 07/31/2019   CHOLHDL 6 08/26/2017   No results found for: WBC, HGB, HCT, MCV, PLT No results found for: IRON, TIBC, FERRITIN  Attestation Statements:   Reviewed by clinician on day of visit: allergies, medications, problem list, medical history, surgical history, family history, social history, and previous encounter notes.   Wilhemena Durie, am acting as Location manager for [employer], FNP-C.  I have reviewed the above documentation for accuracy and completeness, and I agree with the above. -  Georgianne Fick, FNP

## 2019-08-29 ENCOUNTER — Encounter (INDEPENDENT_AMBULATORY_CARE_PROVIDER_SITE_OTHER): Payer: Self-pay | Admitting: Family Medicine

## 2019-08-31 ENCOUNTER — Other Ambulatory Visit (INDEPENDENT_AMBULATORY_CARE_PROVIDER_SITE_OTHER): Payer: Self-pay | Admitting: Family Medicine

## 2019-08-31 DIAGNOSIS — E782 Mixed hyperlipidemia: Secondary | ICD-10-CM

## 2019-09-01 ENCOUNTER — Other Ambulatory Visit (INDEPENDENT_AMBULATORY_CARE_PROVIDER_SITE_OTHER): Payer: Self-pay | Admitting: Family Medicine

## 2019-09-01 DIAGNOSIS — R7303 Prediabetes: Secondary | ICD-10-CM

## 2019-09-05 ENCOUNTER — Ambulatory Visit (INDEPENDENT_AMBULATORY_CARE_PROVIDER_SITE_OTHER): Payer: 59 | Admitting: Psychology

## 2019-09-05 DIAGNOSIS — F3289 Other specified depressive episodes: Secondary | ICD-10-CM

## 2019-09-11 ENCOUNTER — Other Ambulatory Visit (INDEPENDENT_AMBULATORY_CARE_PROVIDER_SITE_OTHER): Payer: Self-pay | Admitting: Family Medicine

## 2019-09-11 DIAGNOSIS — F3289 Other specified depressive episodes: Secondary | ICD-10-CM

## 2019-09-12 ENCOUNTER — Ambulatory Visit (INDEPENDENT_AMBULATORY_CARE_PROVIDER_SITE_OTHER): Payer: 59 | Admitting: Family Medicine

## 2019-09-18 ENCOUNTER — Encounter (INDEPENDENT_AMBULATORY_CARE_PROVIDER_SITE_OTHER): Payer: Self-pay | Admitting: Family Medicine

## 2019-09-18 ENCOUNTER — Ambulatory Visit (INDEPENDENT_AMBULATORY_CARE_PROVIDER_SITE_OTHER): Payer: 59 | Admitting: Family Medicine

## 2019-09-18 ENCOUNTER — Other Ambulatory Visit: Payer: Self-pay

## 2019-09-18 VITALS — BP 122/79 | HR 93 | Temp 97.5°F | Ht 68.0 in | Wt 223.0 lb

## 2019-09-18 DIAGNOSIS — Z6833 Body mass index (BMI) 33.0-33.9, adult: Secondary | ICD-10-CM | POA: Diagnosis not present

## 2019-09-18 DIAGNOSIS — R7303 Prediabetes: Secondary | ICD-10-CM | POA: Diagnosis not present

## 2019-09-18 DIAGNOSIS — E669 Obesity, unspecified: Secondary | ICD-10-CM | POA: Diagnosis not present

## 2019-09-18 NOTE — Progress Notes (Signed)
Chief Complaint:   OBESITY Alejandro [name] is here to discuss his progress with his obesity treatment plan along with follow-up of his obesity related diagnoses. Alejandro [name] is on following a lower carbohydrate, vegetable and lean protein rich diet plan and states he is following his eating plan approximately 100% of the time. Alejandro [name] states he is doing cardio, weight lifting, boxing, and jumping jacks for 30-45 minutes 4 times per week.  Today's visit was #: 10 Starting weight: 254 lbs Starting date: 04/09/2019 Today's weight: 223 lbs Today's date: 09/18/2019 Total lbs lost to date: 31 Total lbs lost since last in-office visit: 12  Interim History: Alejandro [name] is doing a great job on the low carb plan. He is keeping net carbs < 20 per day.  Alejandro [name] is eating 3 meal per day. He has eggs for breakfast and meat at the other 2 meals. He is eating steak and lobster as supper options. His visceral fat has decreased to 12 from 16. He is doing better with water intake but needs to increase his vegetable intake. He is eating some Atkins bars and desserts.  Subjective:   1. Pre-diabetes Alejandro [name] is on metformin TID and he feels it is helpful for polyphagia. He denies polyphagia at this time. Lab Results  Component Value Date   HGBA1C 5.7 (H) 07/31/2019    Assessment/Plan:   1. Pre-diabetes Alejandro [name] will continue metformin, and will continue to work on weight loss, exercise, and decreasing simple carbohydrates to help decrease the risk of diabetes.   2. Class 1 obesity with serious comorbidity and body mass index (BMI) of 33.0 to 33.9 in adult, unspecified obesity type Alejandro [name] is currently in the action stage of change. As such, his goal is to continue with weight loss efforts. He has agreed to following a lower carbohydrate, vegetable and lean protein rich diet plan.   Exercise goals: As is.  Behavioral modification strategies: increasing vegetables and increasing water intake.  Alejandro [name] has agreed to  follow-up with our clinic in 2 weeks. He was informed of the importance of frequent follow-up visits to maximize his success with intensive lifestyle modifications for his multiple health conditions.   Objective:   Blood pressure 122/79, pulse 93, temperature (!) 97.5 F (36.4 C), temperature source Oral, height 5\' 8"  (1.727 m), weight 223 lb (101.2 kg), SpO2 98 %. Body mass index is 33.91 kg/m.  General: Cooperative, alert, well developed, in no acute distress. HEENT: Conjunctivae and lids unremarkable. Cardiovascular: Regular rhythm.  Lungs: Normal work of breathing. Neurologic: No focal deficits.   Lab Results  Component Value Date   CREATININE 0.88 07/31/2019   BUN 16 07/31/2019   NA 143 07/31/2019   K 4.9 07/31/2019   CL 104 07/31/2019   CO2 23 07/31/2019   Lab Results  Component Value Date   ALT 64 (H) 07/31/2019   AST 30 07/31/2019   ALKPHOS 67 07/31/2019   BILITOT <0.2 07/31/2019   Lab Results  Component Value Date   HGBA1C 5.7 (H) 07/31/2019   HGBA1C 5.7 (H) 04/09/2019   Lab Results  Component Value Date   INSULIN 32.3 (H) 07/31/2019   INSULIN 26.6 (H) 04/09/2019   Lab Results  Component Value Date   TSH 1.830 04/09/2019   Lab Results  Component Value Date   CHOL 230 (H) 07/31/2019   HDL 52 07/31/2019   LDLCALC 114 (H) 07/31/2019   LDLDIRECT 224.0 08/26/2017   TRIG 372 (H) 07/31/2019   CHOLHDL 6 08/26/2017   No  results found for: WBC, HGB, HCT, MCV, PLT No results found for: IRON, TIBC, FERRITIN  Attestation Statements:   Reviewed by clinician on day of visit: allergies, medications, problem list, medical history, surgical history, family history, social history, and previous encounter notes.   Wilhemena Durie, am acting as Location manager for [employer], FNP-C.  I have reviewed the above documentation for accuracy and completeness, and I agree with the above. -  Georgianne Fick, FNP

## 2019-09-20 ENCOUNTER — Ambulatory Visit: Payer: Self-pay | Admitting: Family Medicine

## 2019-09-28 ENCOUNTER — Other Ambulatory Visit (INDEPENDENT_AMBULATORY_CARE_PROVIDER_SITE_OTHER): Payer: Self-pay | Admitting: Family Medicine

## 2019-09-28 DIAGNOSIS — E559 Vitamin D deficiency, unspecified: Secondary | ICD-10-CM

## 2019-10-02 ENCOUNTER — Ambulatory Visit (INDEPENDENT_AMBULATORY_CARE_PROVIDER_SITE_OTHER): Payer: 59 | Admitting: Family Medicine

## 2019-10-02 ENCOUNTER — Other Ambulatory Visit (INDEPENDENT_AMBULATORY_CARE_PROVIDER_SITE_OTHER): Payer: Self-pay | Admitting: Family Medicine

## 2019-10-02 ENCOUNTER — Encounter (INDEPENDENT_AMBULATORY_CARE_PROVIDER_SITE_OTHER): Payer: Self-pay | Admitting: Family Medicine

## 2019-10-02 ENCOUNTER — Other Ambulatory Visit: Payer: Self-pay

## 2019-10-02 ENCOUNTER — Ambulatory Visit (INDEPENDENT_AMBULATORY_CARE_PROVIDER_SITE_OTHER): Payer: 59 | Admitting: Psychology

## 2019-10-02 VITALS — BP 132/75 | HR 69 | Temp 97.5°F | Ht 68.0 in | Wt 221.0 lb

## 2019-10-02 DIAGNOSIS — R7303 Prediabetes: Secondary | ICD-10-CM

## 2019-10-02 DIAGNOSIS — F3289 Other specified depressive episodes: Secondary | ICD-10-CM

## 2019-10-02 DIAGNOSIS — Z9189 Other specified personal risk factors, not elsewhere classified: Secondary | ICD-10-CM

## 2019-10-02 DIAGNOSIS — E669 Obesity, unspecified: Secondary | ICD-10-CM | POA: Diagnosis not present

## 2019-10-02 DIAGNOSIS — Z6833 Body mass index (BMI) 33.0-33.9, adult: Secondary | ICD-10-CM

## 2019-10-02 MED ORDER — BUPROPION HCL ER (SR) 150 MG PO TB12: 150.0000 mg | ORAL_TABLET | Freq: Every morning | ORAL | 0 refills | Status: DC

## 2019-10-02 MED ORDER — METFORMIN HCL 500 MG PO TABS: 500.0000 mg | ORAL_TABLET | Freq: Three times a day (TID) | ORAL | 0 refills | Status: DC

## 2019-10-03 ENCOUNTER — Encounter (INDEPENDENT_AMBULATORY_CARE_PROVIDER_SITE_OTHER): Payer: Self-pay | Admitting: Family Medicine

## 2019-10-03 NOTE — Progress Notes (Signed)
Chief Complaint:   OBESITY Alejandro [name] is here to discuss his progress with his obesity treatment plan along with follow-up of his obesity related diagnoses. Alejandro [name] is on following a lower carbohydrate, vegetable and lean protein rich diet plan and states he is following his eating plan approximately 100% of the time. Gwynne states he is doing kick boxing, walking, and running for 40-45 minutes 5 times per week.  Today's visit was #: 11 Starting weight: 254 lbs Starting date: 04/09/2019 Today's weight: 221 lbs Today's date: 10/02/2019 Total lbs lost to date: 33 Total lbs lost since last in-office visit: 2  Interim History: [name] is upset with only 2 lb weight loss. He notes water intake is down. Vegetable intake is good. He is keeping "net" carbohydrates under 20. He does admit to eating out a few times but notes he stayed on the plan fairly well.  Subjective:   1. Pre-diabetes Alejandro [name] denies polyphagia. He is on metformin TID. Last A1c was 5.7.  2. Other depression with emotional eating  [name] denies cravings. He is taking bupropion. He is sleeping well. His blood pressure is within normal limits.  3. At risk for diabetes mellitus [name] is at higher than average risk for developing diabetes due to his obesity.   Assessment/Plan:   1. Pre-diabetes Alejandro [name] will continue to work on weight loss, exercise, and decreasing simple carbohydrates to help decrease the risk of diabetes. We will refill metformin for 1 month. We will check labs in 1 month.  - metFORMIN (GLUCOPHAGE) 500 MG tablet; Take 1 tablet (500 mg total) by mouth 3 (three) times daily with meals.  Dispense: 90 tablet; Refill: 0  2. Other depression with emotional eating  Behavior modification techniques were discussed today to help [name] deal with his emotional/non-hunger eating behaviors. We will refill bupropion for 1 month. Orders and follow up as documented in patient record.   - buPROPion (WELLBUTRIN SR)  150 MG 12 hr tablet; Take 1 tablet (150 mg total) by mouth in the morning.  Dispense: 30 tablet; Refill: 0  3. At risk for diabetes mellitus Alejandro [name] was given approximately 15 minutes of diabetes education and counseling today. We discussed intensive lifestyle modifications today with an emphasis on weight loss as well as increasing exercise and decreasing simple carbohydrates in his diet. We also reviewed medication options with an emphasis on risk versus benefit of those discussed.   Repetitive spaced learning was employed today to elicit superior memory formation and behavioral change.  4. Class 1 obesity with serious comorbidity and body mass index (BMI) of 33.0 to 33.9 in adult, unspecified obesity type Alejandro [name] is currently in the action stage of change. As such, his goal is to continue with weight loss efforts. He has agreed to following a lower carbohydrate, vegetable and lean protein rich diet plan.   Exercise goals: As is.  Behavioral modification strategies: increasing water intake and decreasing eating out.  Alejandro [name] has agreed to follow-up with our clinic in 2 to 3 weeks. He was informed of the importance of frequent follow-up visits to maximize his success with intensive lifestyle modifications for his multiple health conditions.   Objective:   Blood pressure 132/75, pulse 69, temperature (!) 97.5 F (36.4 C), temperature source Oral, height 5\' 8"  (1.727 m), weight 221 lb (100.2 kg), SpO2 99 %. Body mass index is 33.6 kg/m.  General: Cooperative, alert, well developed, in no acute distress. HEENT: Conjunctivae and lids unremarkable. Cardiovascular: Regular rhythm.  Lungs: Normal work of  breathing. Neurologic: No focal deficits.   Lab Results  Component Value Date   CREATININE 0.88 07/31/2019   BUN 16 07/31/2019   NA 143 07/31/2019   K 4.9 07/31/2019   CL 104 07/31/2019   CO2 23 07/31/2019   Lab Results  Component Value Date   ALT 64 (H) 07/31/2019   AST 30  07/31/2019   ALKPHOS 67 07/31/2019   BILITOT <0.2 07/31/2019   Lab Results  Component Value Date   HGBA1C 5.7 (H) 07/31/2019   HGBA1C 5.7 (H) 04/09/2019   Lab Results  Component Value Date   INSULIN 32.3 (H) 07/31/2019   INSULIN 26.6 (H) 04/09/2019   Lab Results  Component Value Date   TSH 1.830 04/09/2019   Lab Results  Component Value Date   CHOL 230 (H) 07/31/2019   HDL 52 07/31/2019   LDLCALC 114 (H) 07/31/2019   LDLDIRECT 224.0 08/26/2017   TRIG 372 (H) 07/31/2019   CHOLHDL 6 08/26/2017   No results found for: WBC, HGB, HCT, MCV, PLT No results found for: IRON, TIBC, FERRITIN  Attestation Statements:   Reviewed by clinician on day of visit: allergies, medications, problem list, medical history, surgical history, family history, social history, and previous encounter notes.   Wilhemena Durie, am acting as Location manager for [employer], FNP-C.  I have reviewed the above documentation for accuracy and completeness, and I agree with the above. -  Georgianne Fick, FNP

## 2019-10-05 ENCOUNTER — Other Ambulatory Visit (INDEPENDENT_AMBULATORY_CARE_PROVIDER_SITE_OTHER): Payer: Self-pay | Admitting: Family Medicine

## 2019-10-05 DIAGNOSIS — E782 Mixed hyperlipidemia: Secondary | ICD-10-CM

## 2019-10-11 ENCOUNTER — Other Ambulatory Visit (INDEPENDENT_AMBULATORY_CARE_PROVIDER_SITE_OTHER): Payer: Self-pay | Admitting: Family Medicine

## 2019-10-11 DIAGNOSIS — F3289 Other specified depressive episodes: Secondary | ICD-10-CM

## 2019-10-23 ENCOUNTER — Encounter (INDEPENDENT_AMBULATORY_CARE_PROVIDER_SITE_OTHER): Payer: Self-pay | Admitting: Family Medicine

## 2019-10-23 ENCOUNTER — Ambulatory Visit (INDEPENDENT_AMBULATORY_CARE_PROVIDER_SITE_OTHER): Payer: 59 | Admitting: Family Medicine

## 2019-10-23 ENCOUNTER — Other Ambulatory Visit: Payer: Self-pay

## 2019-10-23 VITALS — BP 123/77 | HR 73 | Temp 97.8°F | Ht 68.0 in | Wt 210.0 lb

## 2019-10-23 DIAGNOSIS — Z9189 Other specified personal risk factors, not elsewhere classified: Secondary | ICD-10-CM | POA: Diagnosis not present

## 2019-10-23 DIAGNOSIS — Z6831 Body mass index (BMI) 31.0-31.9, adult: Secondary | ICD-10-CM

## 2019-10-23 DIAGNOSIS — E669 Obesity, unspecified: Secondary | ICD-10-CM

## 2019-10-23 DIAGNOSIS — R7303 Prediabetes: Secondary | ICD-10-CM

## 2019-10-23 DIAGNOSIS — E559 Vitamin D deficiency, unspecified: Secondary | ICD-10-CM | POA: Diagnosis not present

## 2019-10-23 MED ORDER — VITAMIN D (ERGOCALCIFEROL) 1.25 MG (50000 UNIT) PO CAPS: 50000.0000 [IU] | ORAL_CAPSULE | ORAL | 0 refills | Status: DC

## 2019-10-23 NOTE — Progress Notes (Signed)
Chief Complaint:   OBESITY Alejandro [name] is here to discuss his progress with his obesity treatment plan along with follow-up of his obesity related diagnoses. [name] is on following a lower carbohydrate, vegetable and lean protein rich diet plan and states he is following his eating plan approximately 100% of the time. [name] states he is doing cardio, HIT workout, and yoga for 45-50 minutes 7 times per week.  Today's visit was #: 12 Starting weight: 254 lbs Starting date: 04/09/2019 Today's weight: 210 lbs Today's date: 10/23/2019 Total lbs lost to date: 44 Total lbs lost since last in-office visit: 11  Interim History: Alejandro [name] has increased his exercise and he sometimes works out twice a day (yoga, boxing, HIT). He is following a strict keto plan. 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 He has added MCT oil. He has been eating a lot of salmon. He had increased veggies and he notes he needs to eat more.  His visceral  fat has decreased from 16 to 10 per bioimpedance scale.  Subjective:   1. Vitamin D deficiency Lenville's last Vit D level was low at 21.8. He is on prescription Vit D.  2. Pre-diabetes [name]'s last A1c was 5.7. He is on metformin TID, and he denies polyphagia. He denies excessive cravings.  3. At risk for heart disease [name] is at a higher than average risk for cardiovascular disease due to obesity, pre-diabetes, elevated triglycerides, and elevated LDL.   Assessment/Plan:   1. Vitamin D deficiency Low Vitamin D level contributes to fatigue and are associated with obesity, breast, and colon cancer. We will refill prescription Vitamin D for 1 month. [name] will follow-up for routine testing of Vitamin D, at least 2-3 times per year to avoid over-replacement.  - Vitamin D, Ergocalciferol, (DRISDOL) 1.25 MG (50000 UNIT) CAPS capsule; Take 1 capsule (50,000 Units total) by mouth every 7 (seven) days.  Dispense: 4 capsule; Refill: 0  2. Pre-diabetes Alejandro [name]  will continue continue metformin, and will continue to work on weight loss, exercise, and decreasing simple carbohydrates to help decrease the risk of diabetes.  We will recheck labs in 6 weeks.  3. At risk for heart disease Alejandro [name] was given approximately 15 minutes of coronary artery disease prevention counseling today. He is 39 y.o. male and has risk factors for heart disease including obesity, pre-diabetes, elevated triglycerides, and elevated LDL. We discussed intensive lifestyle modifications today with an emphasis on specific weight loss instructions and strategies.   Repetitive spaced learning was employed today to elicit superior memory formation and behavioral change.  4. Class 1 obesity with serious comorbidity and body mass index (BMI) of 31.0 to 31.9 in adult, unspecified obesity type Alejandro [name] is currently in the action stage of change. As such, his goal is to continue with weight loss efforts. He has agreed to following a lower carbohydrate, vegetable and lean protein rich diet plan.   Exercise goals: As is.  Behavioral modification strategies: increasing vegetables.  Alejandro [name] has agreed to follow-up with our clinic in 3 weeks. He was informed of the importance of frequent follow-up visits to maximize his success with intensive lifestyle modifications for his multiple health conditions.   Objective:   Blood pressure 123/77, pulse 73, temperature 97.8 F (36.6 C), temperature source Oral, height 5\' 8"  (1.727 m), weight 210 lb (95.3 kg), SpO2 99 %. Body mass index is 31.93 kg/m.  General: Cooperative, alert, well developed, in no acute distress. HEENT: Conjunctivae and lids unremarkable. Cardiovascular: Regular  rhythm.  Lungs: Normal work of breathing. Neurologic: No focal deficits.   Lab Results  Component Value Date   CREATININE 0.88 07/31/2019   BUN 16 07/31/2019   NA 143 07/31/2019   K 4.9 07/31/2019   CL 104 07/31/2019   CO2 23 07/31/2019   Lab Results    Component Value Date   ALT 64 (H) 07/31/2019   AST 30 07/31/2019   ALKPHOS 67 07/31/2019   BILITOT <0.2 07/31/2019   Lab Results  Component Value Date   HGBA1C 5.7 (H) 07/31/2019   HGBA1C 5.7 (H) 04/09/2019   Lab Results  Component Value Date   INSULIN 32.3 (H) 07/31/2019   INSULIN 26.6 (H) 04/09/2019   Lab Results  Component Value Date   TSH 1.830 04/09/2019   Lab Results  Component Value Date   CHOL 230 (H) 07/31/2019   HDL 52 07/31/2019   LDLCALC 114 (H) 07/31/2019   LDLDIRECT 224.0 08/26/2017   TRIG 372 (H) 07/31/2019   CHOLHDL 6 08/26/2017   No results found for: WBC, HGB, HCT, MCV, PLT No results found for: IRON, TIBC, FERRITIN  Attestation Statements:   Reviewed by clinician on day of visit: allergies, medications, problem list, medical history, surgical history, family history, social history, and previous encounter notes.   Wilhemena Durie, am acting as Location manager for [employer], FNP-C.  I have reviewed the above documentation for accuracy and completeness, and I agree with the above. -  Georgianne Fick, FNP

## 2019-11-08 ENCOUNTER — Other Ambulatory Visit (INDEPENDENT_AMBULATORY_CARE_PROVIDER_SITE_OTHER): Payer: Self-pay | Admitting: Family Medicine

## 2019-11-08 DIAGNOSIS — F3289 Other specified depressive episodes: Secondary | ICD-10-CM

## 2019-11-09 ENCOUNTER — Other Ambulatory Visit (INDEPENDENT_AMBULATORY_CARE_PROVIDER_SITE_OTHER): Payer: Self-pay | Admitting: Family Medicine

## 2019-11-09 ENCOUNTER — Other Ambulatory Visit (INDEPENDENT_AMBULATORY_CARE_PROVIDER_SITE_OTHER): Payer: Self-pay | Admitting: Bariatrics

## 2019-11-09 ENCOUNTER — Ambulatory Visit (INDEPENDENT_AMBULATORY_CARE_PROVIDER_SITE_OTHER): Payer: 59 | Admitting: Psychology

## 2019-11-09 DIAGNOSIS — F3289 Other specified depressive episodes: Secondary | ICD-10-CM | POA: Diagnosis not present

## 2019-11-09 DIAGNOSIS — R7303 Prediabetes: Secondary | ICD-10-CM

## 2019-11-09 DIAGNOSIS — E782 Mixed hyperlipidemia: Secondary | ICD-10-CM

## 2019-11-14 ENCOUNTER — Other Ambulatory Visit: Payer: Self-pay

## 2019-11-14 ENCOUNTER — Ambulatory Visit (INDEPENDENT_AMBULATORY_CARE_PROVIDER_SITE_OTHER): Payer: 59 | Admitting: Family Medicine

## 2019-11-14 ENCOUNTER — Encounter (INDEPENDENT_AMBULATORY_CARE_PROVIDER_SITE_OTHER): Payer: Self-pay | Admitting: Family Medicine

## 2019-11-14 VITALS — BP 114/73 | HR 68 | Temp 97.8°F | Ht 68.0 in | Wt 203.0 lb

## 2019-11-14 DIAGNOSIS — E782 Mixed hyperlipidemia: Secondary | ICD-10-CM | POA: Diagnosis not present

## 2019-11-14 DIAGNOSIS — Z6830 Body mass index (BMI) 30.0-30.9, adult: Secondary | ICD-10-CM

## 2019-11-14 DIAGNOSIS — E559 Vitamin D deficiency, unspecified: Secondary | ICD-10-CM | POA: Diagnosis not present

## 2019-11-14 DIAGNOSIS — R7303 Prediabetes: Secondary | ICD-10-CM | POA: Diagnosis not present

## 2019-11-14 DIAGNOSIS — Z9189 Other specified personal risk factors, not elsewhere classified: Secondary | ICD-10-CM

## 2019-11-14 DIAGNOSIS — E669 Obesity, unspecified: Secondary | ICD-10-CM

## 2019-11-14 MED ORDER — ATORVASTATIN CALCIUM 20 MG PO TABS: 20.0000 mg | ORAL_TABLET | Freq: Every day | ORAL | 0 refills | Status: DC

## 2019-11-14 MED ORDER — VITAMIN D (ERGOCALCIFEROL) 1.25 MG (50000 UNIT) PO CAPS: 50000.0000 [IU] | ORAL_CAPSULE | ORAL | 0 refills | Status: DC

## 2019-11-14 MED ORDER — METFORMIN HCL 500 MG PO TABS: 500.0000 mg | ORAL_TABLET | Freq: Three times a day (TID) | ORAL | 0 refills | Status: DC

## 2019-11-14 NOTE — Progress Notes (Signed)
Chief Complaint:   OBESITY Eiker is here to discuss his progress with his obesity treatment plan along with follow-up of his obesity related diagnoses. Whitfield is on following a lower carbohydrate, vegetable and lean protein rich diet plan and states he is following his eating plan approximately 100% of the time. Eulan states he is doing yoga, 415 shred, and kickboxing for 45 minutes 7 times per week.  Today's visit was #: 13 Starting weight: 254 lbs Starting date: 04/09/2019 Today's weight: 203 lbs Today's date: 11/14/2019 Total lbs lost to date: 51 Total lbs lost since last in-office visit: 7  Interim History: Blythe is doing very well on the low carbohydrate plan. He is doing quite a bit of exercise also. He denies feeling deprived. He is allowing himself 2 Good yogurt and berries. He is getting plenty of rest.  Subjective:   1. Mixed hyperlipidemia Lynix has hyperlipidemia and has been trying to improve his cholesterol levels with intensive lifestyle modification including a low saturated fat diet, exercise and weight loss. His last LDL was high at 118, triglycerides very high at 372, and HDL at goal at 52. 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 He is on Lipitor 20 mg daily. He denies any chest pain, claudication or myalgias.  Lab Results  Component Value Date   ALT 64 (H) 07/31/2019   AST 30 07/31/2019   ALKPHOS 67 07/31/2019   BILITOT <0.2 07/31/2019   Lab Results  Component Value Date   CHOL 230 (H) 07/31/2019   HDL 52 07/31/2019   LDLCALC 114 (H) 07/31/2019   LDLDIRECT 224.0 08/26/2017   TRIG 372 (H) 07/31/2019   CHOLHDL 6 08/26/2017   2. Vitamin D deficiency Bralin is on prescription Vit D, and his last Vit D level was low at 21.8.  3. Pre-diabetes Michaelray has a diagnosis of pre-diabetes based on his elevated Hgb A1c and was informed this puts him at greater risk of developing diabetes. Last A1c was 5.7. He is on metformin TID, and he denies  polyphagia. He continues to work on diet and exercise to decrease his risk of diabetes. He denies nausea or hypoglycemia.  Lab Results  Component Value Date   HGBA1C 5.7 (H) 07/31/2019   Lab Results  Component Value Date   INSULIN 32.3 (H) 07/31/2019   INSULIN 26.6 (H) 04/09/2019   4. At risk for heart disease Edder is at a higher than average risk for cardiovascular disease due to obesity and hyperlipidemia.   Assessment/Plan:   1. Mixed hyperlipidemia Cardiovascular risk and specific lipid/LDL goals reviewed. We discussed several lifestyle modifications today and Khalid will continue to work on diet, exercise and weight loss efforts. We will refill atorvastatin for 1 month. We will recheck labs at his next office visit.   - atorvastatin (LIPITOR) 20 MG tablet; Take 1 tablet (20 mg total) by mouth daily.  Dispense: 30 tablet; Refill: 0  2. Vitamin D deficiency Low Vitamin D level contributes to fatigue and are associated with obesity, breast, and colon cancer. We will refill prescription Vitamin D for 1 month. Khair will follow-up for routine testing of Vitamin D, at least 2-3 times per year to avoid over-replacement. We will recheck labs at his next office visit.  - Vitamin D, Ergocalciferol, (DRISDOL) 1.25 MG (50000 UNIT) CAPS capsule; Take 1 capsule (50,000 Units total) by mouth every 7 (seven) days.  Dispense: 4 capsule; Refill: 0  3. Pre-diabetes Manoj will continue to work on weight loss, exercise, and  decreasing simple carbohydrates to help decrease the risk of diabetes. We will refill metformin for 1 month. We will recheck labs at his next office visit.  - metFORMIN (GLUCOPHAGE) 500 MG tablet; Take 1 tablet (500 mg total) by mouth 3 (three) times daily with meals.  Dispense: 90 tablet; Refill: 0  4. At risk for heart disease Derrike was given approximately 15 minutes of coronary artery disease prevention counseling today. He is 39 y.o. male and has risk factors for  heart disease including obesity and hyperlipidemia. We discussed intensive lifestyle modifications today with an emphasis on specific weight loss instructions and strategies.   Repetitive spaced learning was employed today to elicit superior memory formation and behavioral change.  5. Class 1 obesity with serious comorbidity and body mass index (BMI) of 30.0 to 30.9 in adult, unspecified obesity type Bronc is currently in the action stage of change. As such, his goal is to continue with weight loss efforts. He has agreed to following a lower carbohydrate, vegetable and lean protein rich diet plan.   Exercise goals: As is.  Behavioral modification strategies: travel eating strategies and planning for success. Handout: Smart fruit  Joshual has agreed to follow-up with our clinic in 3 weeks. He was informed of the importance of frequent follow-up visits to maximize his success with intensive lifestyle modifications for his multiple health conditions.   Objective:   Blood pressure 114/73, pulse 68, temperature 97.8 F (36.6 C), temperature source Oral, height 5\' 8"  (1.727 m), weight 203 lb (92.1 kg), SpO2 98 %. Body mass index is 30.87 kg/m.  General: Cooperative, alert, well developed, in no acute distress. HEENT: Conjunctivae and lids unremarkable. Cardiovascular: Regular rhythm.  Lungs: Normal work of breathing. Neurologic: No focal deficits.   Lab Results  Component Value Date   CREATININE 0.88 07/31/2019   BUN 16 07/31/2019   NA 143 07/31/2019   K 4.9 07/31/2019   CL 104 07/31/2019   CO2 23 07/31/2019   Lab Results  Component Value Date   ALT 64 (H) 07/31/2019   AST 30 07/31/2019   ALKPHOS 67 07/31/2019   BILITOT <0.2 07/31/2019   Lab Results  Component Value Date   HGBA1C 5.7 (H) 07/31/2019   HGBA1C 5.7 (H) 04/09/2019   Lab Results  Component Value Date   INSULIN 32.3 (H) 07/31/2019   INSULIN 26.6 (H) 04/09/2019   Lab Results  Component Value Date   TSH  1.830 04/09/2019   Lab Results  Component Value Date   CHOL 230 (H) 07/31/2019   HDL 52 07/31/2019   LDLCALC 114 (H) 07/31/2019   LDLDIRECT 224.0 08/26/2017   TRIG 372 (H) 07/31/2019   CHOLHDL 6 08/26/2017   No results found for: WBC, HGB, HCT, MCV, PLT No results found for: IRON, TIBC, FERRITIN  Attestation Statements:   Reviewed by clinician on day of visit: allergies, medications, problem list, medical history, surgical history, family history, social history, and previous encounter notes.   Wilhemena Durie, am acting as Location manager for [employer], FNP-C.  I have reviewed the above documentation for accuracy and completeness, and I agree with the above. -  Georgianne Fick, FNP

## 2019-11-18 ENCOUNTER — Other Ambulatory Visit (INDEPENDENT_AMBULATORY_CARE_PROVIDER_SITE_OTHER): Payer: Self-pay | Admitting: Family Medicine

## 2019-11-18 DIAGNOSIS — E559 Vitamin D deficiency, unspecified: Secondary | ICD-10-CM

## 2019-12-10 ENCOUNTER — Other Ambulatory Visit: Payer: Self-pay

## 2019-12-10 ENCOUNTER — Encounter (INDEPENDENT_AMBULATORY_CARE_PROVIDER_SITE_OTHER): Payer: Self-pay | Admitting: Family Medicine

## 2019-12-10 ENCOUNTER — Ambulatory Visit (INDEPENDENT_AMBULATORY_CARE_PROVIDER_SITE_OTHER): Payer: 59 | Admitting: Family Medicine

## 2019-12-10 VITALS — BP 111/69 | HR 64 | Temp 97.5°F | Ht 68.0 in | Wt 196.0 lb

## 2019-12-10 DIAGNOSIS — Z9189 Other specified personal risk factors, not elsewhere classified: Secondary | ICD-10-CM

## 2019-12-10 DIAGNOSIS — R7303 Prediabetes: Secondary | ICD-10-CM | POA: Diagnosis not present

## 2019-12-10 DIAGNOSIS — K760 Fatty (change of) liver, not elsewhere classified: Secondary | ICD-10-CM

## 2019-12-10 DIAGNOSIS — F3289 Other specified depressive episodes: Secondary | ICD-10-CM

## 2019-12-10 DIAGNOSIS — E7849 Other hyperlipidemia: Secondary | ICD-10-CM | POA: Diagnosis not present

## 2019-12-10 DIAGNOSIS — Z6830 Body mass index (BMI) 30.0-30.9, adult: Secondary | ICD-10-CM

## 2019-12-10 DIAGNOSIS — E669 Obesity, unspecified: Secondary | ICD-10-CM

## 2019-12-10 DIAGNOSIS — E559 Vitamin D deficiency, unspecified: Secondary | ICD-10-CM | POA: Diagnosis not present

## 2019-12-10 MED ORDER — VITAMIN D (ERGOCALCIFEROL) 1.25 MG (50000 UNIT) PO CAPS: 50000.0000 [IU] | ORAL_CAPSULE | ORAL | 0 refills | Status: DC

## 2019-12-10 MED ORDER — ATORVASTATIN CALCIUM 20 MG PO TABS: 20.0000 mg | ORAL_TABLET | Freq: Every day | ORAL | 0 refills | Status: DC

## 2019-12-10 MED ORDER — BUPROPION HCL ER (SR) 150 MG PO TB12: ORAL_TABLET | ORAL | 0 refills | Status: DC

## 2019-12-11 ENCOUNTER — Other Ambulatory Visit (INDEPENDENT_AMBULATORY_CARE_PROVIDER_SITE_OTHER): Payer: Self-pay | Admitting: Family Medicine

## 2019-12-11 DIAGNOSIS — R7303 Prediabetes: Secondary | ICD-10-CM

## 2019-12-11 DIAGNOSIS — E7849 Other hyperlipidemia: Secondary | ICD-10-CM

## 2019-12-11 LAB — COMPREHENSIVE METABOLIC PANEL
ALT: 46 IU/L — ABNORMAL HIGH (ref 0–44)
AST: 28 IU/L (ref 0–40)
Albumin/Globulin Ratio: 1.9 (ref 1.2–2.2)
Albumin: 4.3 g/dL (ref 4.0–5.0)
Alkaline Phosphatase: 83 IU/L (ref 48–121)
BUN/Creatinine Ratio: 12 (ref 9–20)
BUN: 12 mg/dL (ref 6–24)
Bilirubin Total: 0.4 mg/dL (ref 0.0–1.2)
CO2: 22 mmol/L (ref 20–29)
Calcium: 9.5 mg/dL (ref 8.7–10.2)
Chloride: 106 mmol/L (ref 96–106)
Creatinine, Ser: 0.97 mg/dL (ref 0.76–1.27)
GFR calc Af Amer: 112 mL/min/{1.73_m2} (ref >59)
GFR calc non Af Amer: 97 mL/min/{1.73_m2} (ref >59)
Globulin, Total: 2.3 g/dL (ref 1.5–4.5)
Glucose: 83 mg/dL (ref 65–99)
Potassium: 4.8 mmol/L (ref 3.5–5.2)
Sodium: 142 mmol/L (ref 134–144)
Total Protein: 6.6 g/dL (ref 6.0–8.5)

## 2019-12-11 LAB — HEMOGLOBIN A1C
Est. average glucose Bld gHb Est-mCnc: 103 mg/dL
Hgb A1c MFr Bld: 5.2 % (ref 4.8–5.6)

## 2019-12-11 LAB — LIPID PANEL WITH LDL/HDL RATIO
Cholesterol, Total: 187 mg/dL (ref 100–199)
HDL: 61 mg/dL (ref >39)
LDL Chol Calc (NIH): 111 mg/dL — ABNORMAL HIGH (ref 0–99)
LDL/HDL Ratio: 1.8 ratio (ref 0.0–3.6)
Triglycerides: 82 mg/dL (ref 0–149)
VLDL Cholesterol Cal: 15 mg/dL (ref 5–40)

## 2019-12-11 LAB — INSULIN, RANDOM: INSULIN: 7.2 u[IU]/mL (ref 2.6–24.9)

## 2019-12-11 LAB — VITAMIN D 25 HYDROXY (VIT D DEFICIENCY, FRACTURES): Vit D, 25-Hydroxy: 58.2 ng/mL (ref 30.0–100.0)

## 2019-12-11 NOTE — Progress Notes (Signed)
Chief Complaint:   OBESITY Alejandro [name] is here to discuss his progress with his obesity treatment plan along with follow-up of his obesity related diagnoses. Mervyn is on following a lower carbohydrate, vegetable and lean protein rich diet plan and states he is following his eating plan approximately 100% of the time. Kamran states he is doing yoga, weight lifting, and kick boxing for 90-120 minutes 7 times per week.  Today's visit was #: 14 Starting weight: 254 lbs Starting date: 04/09/2019 Today's weight: 196 lbs Today's date: 12/10/2019 Total lbs lost to date: 71 Total lbs lost since last in-office visit: 7  Interim History: Ashten has done an amazing job with weight loss losing losing 71 lbs over the last 10 months. He started losing weight and lost 13 lbs before starting at our clinic.  He has added some fruit such as peaches and strawberries to his low carb plan and still continues to lose weight.. He has also added some nuts. He is drinking adequate water and eating adequate amounts of vegetables. He is exercising a great deal.   Subjective:   1. Other hyperlipidemia Jeter has hyperlipidemia and has been trying to improve his cholesterol levels with intensive lifestyle modification including a low saturated fat diet, exercise and weight loss. Last triglycerides were very high at 372, HDL is normal, and LDL slightly elevated at 114. He is on Lipitor 20 mg.  He denies any chest pain, claudication or myalgias.  Lab Results  Component Value Date   ALT 64 (H) 07/31/2019   AST 30 07/31/2019   ALKPHOS 67 07/31/2019   BILITOT <0.2 07/31/2019   Lab Results  Component Value Date   CHOL 230 (H) 07/31/2019   HDL 52 07/31/2019   LDLCALC 114 (H) 07/31/2019   LDLDIRECT 224.0 08/26/2017   TRIG 372 (H) 07/31/2019   CHOLHDL 6 08/26/2017   2. Vitamin D deficiency Donavin's Vit D level is low at 21.8. he is on prescription Vit D.  3. Fatty liver Abdimalik's last ALT was elevated at  64. He has lost 71 lbs over the last 10 months. He also has elevated Tg.  4. Pre-diabetes Orie has a diagnosis of pre-diabetes based on his elevated Hgb A1c and was informed this puts him at greater risk of developing diabetes. Last A1c was 5.7. He is on metformin, and denies polyphagia. He continues to work on diet and exercise to decrease his risk of diabetes. He denies nausea or hypoglycemia.  Lab Results  Component Value Date   HGBA1C 5.7 (H) 07/31/2019   Lab Results  Component Value Date   INSULIN 32.3 (H) 07/31/2019   INSULIN 26.6 (H) 04/09/2019   5. Other depression with emotional eating  Ishmeal notes his cravings are well controlled with bupropion.  6. At risk for impaired metabolic function Trevar is at increased risk for impaired metabolic function due to current nutrition and muscle mass.  Assessment/Plan:   1. Other hyperlipidemia Cardiovascular risk and specific lipid/LDL goals reviewed. We discussed several lifestyle modifications today. Eilan will continue to work on diet, exercise and weight loss efforts. We will check labs today, and we will refill Lipitor for 1 month.   - Comprehensive metabolic panel - Lipid Panel With LDL/HDL Ratio  - atorvastatin (LIPITOR) 20 MG tablet; Take 1 tablet (20 mg total) by mouth daily.  Dispense: 30 tablet; Refill: 0  2. Vitamin D deficiency Low Vitamin D level contributes to fatigue and are associated with obesity, breast, and colon cancer. We  will check labs today, and we will refill prescription Vitamin D for 1 month. Dagen will follow-up for routine testing of Vitamin D, at least 2-3 times per year to avoid over-replacement.  - VITAMIN D 25 Hydroxy (Vit-D Deficiency, Fractures)  - Vitamin D, Ergocalciferol, (DRISDOL) 1.25 MG (50000 UNIT) CAPS capsule; Take 1 capsule (50,000 Units total) by mouth every 7 (seven) days.  Dispense: 4 capsule; Refill: 0  3. Fatty liver We discussed the likely diagnosis of non-alcoholic  fatty liver disease today and how this condition is obesity related. Lavonne was educated the importance of weight loss. Blane agreed to continue with his weight loss efforts with healthier diet and exercise as an essential part of his treatment plan. We will check labs today.  - Comprehensive metabolic panel  4. Pre-diabetes Ingvald will continue to work on weight loss, exercise, and decreasing simple carbohydrates to help decrease the risk of diabetes. We will check labs today.  - Comprehensive metabolic panel - Hemoglobin A1c - Insulin, random  5. Other depression with emotional eating  Behavior modification techniques were discussed today to help Cung deal with his emotional/non-hunger eating behaviors. We will refill bupropion for 1 month. Orders and follow up as documented in patient record.   - buPROPion (WELLBUTRIN SR) 150 MG 12 hr tablet; TAKE 1 TABLET(150 MG) BY MOUTH IN THE MORNING  Dispense: 30 tablet; Refill: 0  6. At risk for impaired metabolic function Lisa was given approximately 15 minutes of impaired  metabolic function prevention counseling today. We discussed intensive lifestyle modifications today with an emphasis on specific nutrition and exercise instructions and strategies.   Repetitive spaced learning was employed today to elicit superior memory formation and behavioral change.  7. Class 1 obesity with serious comorbidity and body mass index (BMI) of 30.0 to 30.9 in adult, unspecified obesity type Chevez is currently in the action stage of change. As such, his goal is to continue with weight loss efforts. He has agreed to following a lower carbohydrate, vegetable and lean protein rich diet plan.   Exercise goals: As is.  Behavioral modification strategies: planning for success.  Jefry has agreed to follow-up with our clinic in 4 weeks. He was informed of the importance of frequent follow-up visits to maximize his success with intensive lifestyle  modifications for his multiple health conditions.   Giorgio was informed we would discuss his lab results at his next visit unless there is a critical issue that needs to be addressed sooner. Raad agreed to keep his next visit at the agreed upon time to discuss these results.  Objective:   Blood pressure 111/69, pulse 64, temperature (!) 97.5 F (36.4 C), temperature source Oral, height 5\' 8"  (1.727 m), weight 196 lb (88.9 kg), SpO2 98 %. Body mass index is 29.8 kg/m.  General: Cooperative, alert, well developed, in no acute distress. HEENT: Conjunctivae and lids unremarkable. Cardiovascular: Regular rhythm.  Lungs: Normal work of breathing. Neurologic: No focal deficits.   Lab Results  Component Value Date   CREATININE 0.88 07/31/2019   BUN 16 07/31/2019   NA 143 07/31/2019   K 4.9 07/31/2019   CL 104 07/31/2019   CO2 23 07/31/2019   Lab Results  Component Value Date   ALT 64 (H) 07/31/2019   AST 30 07/31/2019   ALKPHOS 67 07/31/2019   BILITOT <0.2 07/31/2019   Lab Results  Component Value Date   HGBA1C 5.7 (H) 07/31/2019   HGBA1C 5.7 (H) 04/09/2019   Lab Results  Component  Value Date   INSULIN 32.3 (H) 07/31/2019   INSULIN 26.6 (H) 04/09/2019   Lab Results  Component Value Date   TSH 1.830 04/09/2019   Lab Results  Component Value Date   CHOL 230 (H) 07/31/2019   HDL 52 07/31/2019   LDLCALC 114 (H) 07/31/2019   LDLDIRECT 224.0 08/26/2017   TRIG 372 (H) 07/31/2019   CHOLHDL 6 08/26/2017   No results found for: WBC, HGB, HCT, MCV, PLT No results found for: IRON, TIBC, FERRITIN  Attestation Statements:   Reviewed by clinician on day of visit: allergies, medications, problem list, medical history, surgical history, family history, social history, and previous encounter notes.   Wilhemena Durie, am acting as Location manager for [employer], FNP-C.  I have reviewed the above documentation for accuracy and completeness, and I agree with the above. -  Georgianne Fick, FNP

## 2019-12-21 ENCOUNTER — Ambulatory Visit: Payer: 59 | Admitting: Psychology

## 2020-01-07 ENCOUNTER — Other Ambulatory Visit: Payer: Self-pay

## 2020-01-07 ENCOUNTER — Encounter (INDEPENDENT_AMBULATORY_CARE_PROVIDER_SITE_OTHER): Payer: Self-pay | Admitting: Family Medicine

## 2020-01-07 ENCOUNTER — Ambulatory Visit (INDEPENDENT_AMBULATORY_CARE_PROVIDER_SITE_OTHER): Payer: 59 | Admitting: Family Medicine

## 2020-01-07 VITALS — BP 133/73 | HR 74 | Temp 97.6°F | Ht 68.0 in | Wt 197.0 lb

## 2020-01-07 DIAGNOSIS — F3289 Other specified depressive episodes: Secondary | ICD-10-CM

## 2020-01-07 DIAGNOSIS — Z9189 Other specified personal risk factors, not elsewhere classified: Secondary | ICD-10-CM

## 2020-01-07 DIAGNOSIS — E8881 Metabolic syndrome: Secondary | ICD-10-CM | POA: Diagnosis not present

## 2020-01-07 DIAGNOSIS — E559 Vitamin D deficiency, unspecified: Secondary | ICD-10-CM | POA: Diagnosis not present

## 2020-01-07 DIAGNOSIS — E669 Obesity, unspecified: Secondary | ICD-10-CM

## 2020-01-07 DIAGNOSIS — E7849 Other hyperlipidemia: Secondary | ICD-10-CM | POA: Diagnosis not present

## 2020-01-07 DIAGNOSIS — E88819 Insulin resistance, unspecified: Secondary | ICD-10-CM | POA: Insufficient documentation

## 2020-01-07 DIAGNOSIS — Z6830 Body mass index (BMI) 30.0-30.9, adult: Secondary | ICD-10-CM

## 2020-01-07 MED ORDER — BUPROPION HCL ER (SR) 200 MG PO TB12: ORAL_TABLET | ORAL | 0 refills | Status: DC

## 2020-01-07 MED ORDER — ATORVASTATIN CALCIUM 20 MG PO TABS: 20.0000 mg | ORAL_TABLET | Freq: Every day | ORAL | 0 refills | Status: DC

## 2020-01-07 NOTE — Progress Notes (Signed)
The 10-year ASCVD risk score Mikey Bussing DC Brooke Bonito., et al., 2013) is: 0.8%   Values used to calculate the score:     Age: 39 years     Sex: Male     Is Non-Hispanic African American: No     Diabetic: No     Tobacco smoker: No     Systolic Blood Pressure: 580 mmHg     Is BP treated: No     HDL Cholesterol: 61 mg/dL     Total Cholesterol: 187 mg/dL

## 2020-01-07 NOTE — Progress Notes (Signed)
Chief Complaint:   OBESITY Alejandro [name] is here to discuss his progress with his obesity treatment plan along with follow-up of his obesity related diagnoses. Alejandro [name] is on following a lower carbohydrate, vegetable and lean protein rich diet plan and states he is following his eating plan approximately 80% of the time. Alejandro [name] states he is doing yoga, running, and weight lifting for 120 minutes 7 times per week.  Today's visit was #: 15 Starting weight: 254 lbs Starting date: 04/09/2019 Today's weight: 197 lbs Today's date: 01/07/2020 Total lbs lost to date: 57 Total lbs lost since last in-office visit: 0  Interim History: Alejandro [name] is up 1 lb today. He is struggling to stick to the plan at dinner and is overeating low carb foods such as low carb ice cream. He has lost large amounts of weight in the past and gained it back. He does not want this to happen again. He is thinking about attending [employer]. His goal weight is between 180 and 185 lbs. His body fat % is 18% today.  Subjective:   1. Insulin resistance Alejandro [name] has a diagnosis of insulin resistance based on his elevated fasting insulin level >5. His A1c is down to 5.2 from 5.7. he has been having GI side effects from metformin. He continues to work on diet and exercise to decrease his risk of diabetes. I discussed labs with the patient today.  Lab Results  Component Value Date   INSULIN 7.2 12/10/2019   INSULIN 32.3 (H) 07/31/2019   INSULIN 26.6 (H) 04/09/2019   Lab Results  Component Value Date   HGBA1C 5.2 12/10/2019   2. Other hyperlipidemia Alejandro [name] has hyperlipidemia and has improved his cholesterol levels with intensive lifestyle modifications. His LDL is down to 111 from 169, and triglycerides down to 82 from 433. His ASCVD 10 year risk score is 0.8%. He denies any chest pain, claudication or myalgias.  ALT is now nearly normal at 46. He has NAFLD. I discussed labs with the patient today.   Lab Results    Component Value Date   ALT 46 (H) 12/10/2019   AST 28 12/10/2019   ALKPHOS 83 12/10/2019   BILITOT 0.4 12/10/2019   Lab Results  Component Value Date   CHOL 187 12/10/2019   HDL 61 12/10/2019   LDLCALC 111 (H) 12/10/2019   LDLDIRECT 224.0 08/26/2017   TRIG 82 12/10/2019   CHOLHDL 6 08/26/2017   3. Vitamin D deficiency Alejandro [name]'s Vit D level is at goal. I discussed labs with the patient today.  4. Other depression with emotional eating  Alejandro [name] notes his sugar and carbohydrate cravings have increased quite a bit.  5. At risk for side effect of medication Alejandro [name] is at risk for drug side effects due to increased dose of bupropion.  Assessment/Plan:   1. Insulin resistance Alejandro [name] will continue to work on weight loss, exercise, and decreasing simple carbohydrates to help decrease the risk of diabetes. Alejandro [name] agreed to decrease metformin dose to q daily. Alejandro [name] agreed to follow-up with [place] as directed to closely monitor his progress.  2. Other hyperlipidemia Cardiovascular risk and specific lipid/LDL goals reviewed. We discussed several lifestyle modifications today and Alejandro [name] will continue to work on diet, exercise and weight loss efforts. We will refill atorvastatin for 1 month.   - atorvastatin (LIPITOR) 20 MG tablet; Take 1 tablet (20 mg total) by mouth daily.  Dispense: 30 tablet; Refill: 0  3. Vitamin D deficiency Low Vitamin D level contributes to fatigue  and are associated with obesity, breast, and colon cancer. Alejandro [name] agreed to discontinue prescription Vitamin D, and he will start OTC Vit D3 5,000 IU daily. He will follow-up for routine testing of Vitamin D, at least 2-3 times per year to avoid over-replacement.  4. Other depression with emotional eating  Behavior modification techniques were discussed today to help Alejandro [name] deal with his emotional/non-hunger eating behaviors. Alejandro [name] agreed to increase bupropion to 200 mg q AM with no refills. 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  - buPROPion (WELLBUTRIN SR) 200 MG 12 hr tablet; TAKE 1 TABLET(200 MG) BY MOUTH IN THE MORNING  Dispense: 30 tablet; Refill: 0  5. At risk for side effect of medication Alejandro [name] was given approximately 15 minutes of drug side effect counseling today.  We discussed side effect possibility and risk versus benefits. Alejandro [name] agreed to the medication and will contact this office if these side effects are intolerable.  Repetitive spaced learning was employed today to elicit superior memory formation and behavioral change.  6. Class 1 obesity with serious comorbidity and body mass index (BMI) of 30.0 to 30.9 in adult, unspecified obesity type Alejandro [name] is currently in the action stage of change. As such, his goal is to continue with weight loss efforts. He has agreed to keeping a food journal and adhering to recommended goals of 1900-2100 calories and 120 grams of protein daily and following a lower carbohydrate, vegetable and lean protein rich diet plan.   Exercise goals: As is.  Behavioral modification strategies: decreasing simple carbohydrates, planning for success and keeping a strict food journal.  Alejandro [name] has agreed to follow-up with our clinic in 2 weeks. He was informed of the importance of frequent follow-up visits to maximize his success with intensive lifestyle modifications for his multiple health conditions.   Objective:   Blood pressure (!) 133/73, pulse 74, temperature 97.6 F (36.4 C), temperature source Oral, height 5\' 8"  (1.727 m), weight 197 lb (89.4 kg), SpO2 99 %. Body mass index is 29.95 kg/m.  General: Cooperative, alert, well developed, in no acute distress. HEENT: Conjunctivae and lids unremarkable. Cardiovascular: Regular rhythm.  Lungs: Normal work of breathing. Neurologic: No focal deficits.   Lab Results  Component Value Date   CREATININE 0.97 12/10/2019   BUN 12 12/10/2019   NA 142 12/10/2019   K 4.8 12/10/2019   CL 106  12/10/2019   CO2 22 12/10/2019   Lab Results  Component Value Date   ALT 46 (H) 12/10/2019   AST 28 12/10/2019   ALKPHOS 83 12/10/2019   BILITOT 0.4 12/10/2019   Lab Results  Component Value Date   HGBA1C 5.2 12/10/2019   HGBA1C 5.7 (H) 07/31/2019   HGBA1C 5.7 (H) 04/09/2019   Lab Results  Component Value Date   INSULIN 7.2 12/10/2019   INSULIN 32.3 (H) 07/31/2019   INSULIN 26.6 (H) 04/09/2019   Lab Results  Component Value Date   TSH 1.830 04/09/2019   Lab Results  Component Value Date   CHOL 187 12/10/2019   HDL 61 12/10/2019   LDLCALC 111 (H) 12/10/2019   LDLDIRECT 224.0 08/26/2017   TRIG 82 12/10/2019   CHOLHDL 6 08/26/2017   No results found for: WBC, HGB, HCT, MCV, PLT No results found for: IRON, TIBC, FERRITIN  Attestation Statements:   Reviewed by clinician on day of visit: allergies, medications, problem list, medical history, surgical history, family history, social history, and previous encounter notes.   Wilhemena Durie, am acting as transcriptionist for  [employer], FNP-C.  I have reviewed the above documentation for accuracy and completeness, and I agree with the above. -  Georgianne Fick, FNP

## 2020-01-08 ENCOUNTER — Other Ambulatory Visit (INDEPENDENT_AMBULATORY_CARE_PROVIDER_SITE_OTHER): Payer: Self-pay | Admitting: Family Medicine

## 2020-01-08 DIAGNOSIS — R7303 Prediabetes: Secondary | ICD-10-CM

## 2020-01-08 DIAGNOSIS — F3289 Other specified depressive episodes: Secondary | ICD-10-CM

## 2020-01-21 ENCOUNTER — Ambulatory Visit (INDEPENDENT_AMBULATORY_CARE_PROVIDER_SITE_OTHER): Payer: 59 | Admitting: Family Medicine

## 2020-01-21 ENCOUNTER — Encounter (INDEPENDENT_AMBULATORY_CARE_PROVIDER_SITE_OTHER): Payer: Self-pay | Admitting: Family Medicine

## 2020-01-21 ENCOUNTER — Other Ambulatory Visit: Payer: Self-pay

## 2020-01-21 VITALS — BP 107/67 | HR 79 | Temp 97.6°F | Ht 68.0 in | Wt 199.0 lb

## 2020-01-21 DIAGNOSIS — F3289 Other specified depressive episodes: Secondary | ICD-10-CM

## 2020-01-21 DIAGNOSIS — Z9189 Other specified personal risk factors, not elsewhere classified: Secondary | ICD-10-CM

## 2020-01-21 DIAGNOSIS — E8881 Metabolic syndrome: Secondary | ICD-10-CM | POA: Diagnosis not present

## 2020-01-21 DIAGNOSIS — M62830 Muscle spasm of back: Secondary | ICD-10-CM

## 2020-01-21 DIAGNOSIS — E669 Obesity, unspecified: Secondary | ICD-10-CM

## 2020-01-21 DIAGNOSIS — Z6830 Body mass index (BMI) 30.0-30.9, adult: Secondary | ICD-10-CM

## 2020-01-21 DIAGNOSIS — E88819 Insulin resistance, unspecified: Secondary | ICD-10-CM

## 2020-01-21 MED ORDER — WEGOVY 0.25 MG/0.5ML ~~LOC~~ SOAJ: 0.2500 mg | SUBCUTANEOUS | 0 refills | Status: DC

## 2020-01-21 NOTE — Progress Notes (Signed)
Chief Complaint:   OBESITY Alejandro [name] is here to discuss his progress with his obesity treatment plan along with follow-up of his obesity related diagnoses. Alejandro [name] is on keeping a food journal and adhering to recommended goals of 1900-2100 calories and 120 grams of protein daily and states he is following his eating plan approximately 70% of the time. [name] states he is doing yoga, jogging, and weight lifting for 90 minutes 7 times per week.  Today's visit was #: 48 Starting weight: 254 lbs Starting date: 04/09/2019 Today's weight: 199 lbs Today's date: 01/21/2020 Total lbs lost to date: 55 Total lbs lost since last in-office visit: 0  Interim History: Alejandro [name] is struggling with sweet cravings at night. He has been struggling to stick to the low carb plan, especially in the evenings. He reports protein intake is less than it was. His goal weight is 185 lbs.  Subjective:   1. Insulin resistance Jas has a diagnosis of insulin resistance based on his elevated fasting insulin level >5. He is on metformin daily, TID metformin led to diarrhea. He continues to work on diet and exercise to decrease his risk of diabetes.  Lab Results  Component Value Date   INSULIN 7.2 12/10/2019   INSULIN 32.3 (H) 07/31/2019   INSULIN 26.6 (H) 04/09/2019   Lab Results  Component Value Date   HGBA1C 5.2 12/10/2019   2. Back spasm [name] has been exercising sometimes 2 sessions per day. He has been having back spasms which started in the last 2 days. He feels it is due to his exercise regimen.   3. Other depression with emotional eating  [name] has sweet cravings at night which have worsened.  4. At risk for side effect of medication [name] is at risk for drug side effects due to starting [facility].  Assessment/Plan:   1. Insulin resistance [name] will continue metformin, and will continue to work on weight loss, exercise, and decreasing simple carbohydrates to help decrease the risk of  diabetes. Alejandro [name] agreed to follow-up with [place] as directed to closely monitor his progress.  2. Back spasm Kolyn will discontinue exercise for 4-5 days until spasms subside. He is to apply heat intermittently.  3. Other depression with emotional eating  Behavior modification techniques were discussed today to help Alejandro [name] deal with his emotional/non-hunger eating behaviors. [name] will continue bupropion. Orders and follow up as documented in patient record.   4. At risk for side effect of medication [name] was given approximately 15 minutes of drug side effect counseling today.  We discussed side effect possibility and risk versus benefits. [name] agreed to the medication and will contact this office if these side effects are intolerable.  Repetitive spaced learning was employed today to elicit superior memory formation and behavioral change.  5. Class 1 obesity with serious comorbidity and body mass index (BMI) of 30.0 to 30.9 in adult, unspecified obesity type Alejandro [name] is currently in the action stage of change. As such, his goal is to continue with weight loss efforts. He has agreed to following a lower carbohydrate, vegetable and lean protein rich diet plan.   We discussed various medication options to help Alejandro [name] with his weight loss efforts and we both agreed to start Wegovy 0.25 mg SubQ daily with no refills. Alejandro [name] denies personal or family history of thyroid cancer, history of pancreatitis, or current cholelithiasis. He was informed of side effects.  - Semaglutide-Weight Management (WEGOVY) 0.25 MG/0.5ML SOAJ; Inject 0.25 mg into the skin daily.  Dispense:  2 mL; Refill: 0  Exercise goals: As is.  Behavioral modification strategies: increasing lean protein intake, no skipping meals and travel eating strategies.  [name] has agreed to follow-up with our clinic in 2 weeks. He was informed of the importance of frequent follow-up visits to maximize his success with intensive  lifestyle modifications for his multiple health conditions.   Objective:   Blood pressure 107/67, pulse 79, temperature 97.6 F (36.4 C), temperature source Oral, height 5\' 8"  (1.727 m), weight 199 lb (90.3 kg), SpO2 98 %. Body mass index is 30.26 kg/m.  General: Cooperative, alert, well developed, in no acute distress. HEENT: Conjunctivae and lids unremarkable. Cardiovascular: Regular rhythm.  Lungs: Normal work of breathing. Neurologic: No focal deficits.   Lab Results  Component Value Date   CREATININE 0.97 12/10/2019   BUN 12 12/10/2019   NA 142 12/10/2019   K 4.8 12/10/2019   CL 106 12/10/2019   CO2 22 12/10/2019   Lab Results  Component Value Date   ALT 46 (H) 12/10/2019   AST 28 12/10/2019   ALKPHOS 83 12/10/2019   BILITOT 0.4 12/10/2019   Lab Results  Component Value Date   HGBA1C 5.2 12/10/2019   HGBA1C 5.7 (H) 07/31/2019   HGBA1C 5.7 (H) 04/09/2019   Lab Results  Component Value Date   INSULIN 7.2 12/10/2019   INSULIN 32.3 (H) 07/31/2019   INSULIN 26.6 (H) 04/09/2019   Lab Results  Component Value Date   TSH 1.830 04/09/2019   Lab Results  Component Value Date   CHOL 187 12/10/2019   HDL 61 12/10/2019   LDLCALC 111 (H) 12/10/2019   LDLDIRECT 224.0 08/26/2017   TRIG 82 12/10/2019   CHOLHDL 6 08/26/2017   No results found for: WBC, HGB, HCT, MCV, PLT No results found for: IRON, TIBC, FERRITIN  Attestation Statements:   Reviewed by clinician on day of visit: allergies, medications, problem list, medical history, surgical history, family history, social history, and previous encounter notes.   Wilhemena Durie, am acting as Location manager for [employer], FNP-C.  I have reviewed the above documentation for accuracy and completeness, and I agree with the above. -  Georgianne Fick, FNP

## 2020-01-22 ENCOUNTER — Encounter (INDEPENDENT_AMBULATORY_CARE_PROVIDER_SITE_OTHER): Payer: Self-pay | Admitting: Family Medicine

## 2020-01-23 ENCOUNTER — Encounter (INDEPENDENT_AMBULATORY_CARE_PROVIDER_SITE_OTHER): Payer: Self-pay | Admitting: Family Medicine

## 2020-01-23 ENCOUNTER — Telehealth (INDEPENDENT_AMBULATORY_CARE_PROVIDER_SITE_OTHER): Payer: Self-pay | Admitting: Family Medicine

## 2020-01-23 NOTE — Telephone Encounter (Signed)
Alejandro [name] states he went to pick up the weogy prescription and they don't have the correct strength but they do have a stronger one and he would like to get it.  It will be along time before they have 2.5 strength in.

## 2020-01-28 NOTE — Telephone Encounter (Signed)
Call placed to patient per the provider we will not be able to change the dose of the medication since this is a starting dose. Patient was made aware it could be September before the medication will be back in stock.Patient verbalized understanding

## 2020-02-05 ENCOUNTER — Other Ambulatory Visit (INDEPENDENT_AMBULATORY_CARE_PROVIDER_SITE_OTHER): Payer: Self-pay | Admitting: Family Medicine

## 2020-02-05 DIAGNOSIS — F3289 Other specified depressive episodes: Secondary | ICD-10-CM

## 2020-02-11 ENCOUNTER — Other Ambulatory Visit: Payer: Self-pay

## 2020-02-11 ENCOUNTER — Ambulatory Visit (INDEPENDENT_AMBULATORY_CARE_PROVIDER_SITE_OTHER): Payer: 59 | Admitting: Family Medicine

## 2020-02-11 VITALS — BP 128/80 | HR 76 | Temp 97.6°F | Ht 68.0 in | Wt 205.0 lb

## 2020-02-11 DIAGNOSIS — E8881 Metabolic syndrome: Secondary | ICD-10-CM | POA: Diagnosis not present

## 2020-02-11 DIAGNOSIS — Z9189 Other specified personal risk factors, not elsewhere classified: Secondary | ICD-10-CM

## 2020-02-11 DIAGNOSIS — E669 Obesity, unspecified: Secondary | ICD-10-CM

## 2020-02-11 DIAGNOSIS — F3289 Other specified depressive episodes: Secondary | ICD-10-CM | POA: Diagnosis not present

## 2020-02-11 DIAGNOSIS — E88819 Insulin resistance, unspecified: Secondary | ICD-10-CM

## 2020-02-11 DIAGNOSIS — Z6831 Body mass index (BMI) 31.0-31.9, adult: Secondary | ICD-10-CM

## 2020-02-11 MED ORDER — BUPROPION HCL ER (SR) 200 MG PO TB12: 200.0000 mg | ORAL_TABLET | Freq: Every day | ORAL | 0 refills | Status: DC

## 2020-02-11 MED ORDER — BUPROPION HCL ER (SR) 200 MG PO TB12: 200.0000 mg | ORAL_TABLET | Freq: Two times a day (BID) | ORAL | 0 refills | Status: DC

## 2020-02-12 ENCOUNTER — Encounter (INDEPENDENT_AMBULATORY_CARE_PROVIDER_SITE_OTHER): Payer: Self-pay | Admitting: Family Medicine

## 2020-02-12 NOTE — Progress Notes (Signed)
Chief Complaint:   OBESITY Alejandro [name] is here to discuss his progress with his obesity treatment plan along with follow-up of his obesity related diagnoses. Alejandro [name] is on following a lower carbohydrate, vegetable and lean protein rich diet plan and states he is following his eating plan approximately 25% of the time. Alejandro [name] states he is doing HIT, yoga, and weights for 45 minutes 1-2 times per week.  Today's visit was #: 66 Starting weight: 254 lbs Starting date: 04/09/2019 Today's weight: 205 lbs Today's date: 02/11/2020 Total lbs lost to date: 49 Total lbs lost since last in-office visit: 0  Interim History: Alejandro [name] has started Alejandro [name] (4 days ago). He denies side effects. He has not noticed any appetite suppression yet. He has been off the plan due to a vacation and is up 6 lbs today. He is not yet back on the plan.  Subjective:   1. Other depression with emotional eating  Alejandro [name] notes cravings for carbs, especially cereal. He has purchased a lock box to keep his cereal in at home. His mood is stable on Lexapro and bupropion.  2. Insulin resistance Alejandro [name] has a diagnosis of insulin resistance based on his elevated fasting insulin level >5. He was previously pre-diabetic and he is on metformin.   Lab Results  Component Value Date   INSULIN 7.2 12/10/2019   INSULIN 32.3 (H) 07/31/2019   INSULIN 26.6 (H) 04/09/2019   Lab Results  Component Value Date   HGBA1C 5.2 12/10/2019   3. At risk for dehydration Alejandro [name] is at risk for dehydration due to following the low carb plan, heat, humidity, and exercise.  Assessment/Plan:   1. Other depression with emotional eating  Behavior modification techniques were discussed today to help Alejandro [name] deal with his emotional/non-hunger eating behaviors. Alejandro [name] agreed to increase bupropion to 200 mg BID #60 with no refills.   2. Insulin resistance Alejandro [name] will continue metformin.  3. At risk for dehydration Alejandro [name] was given  approximately 15 minutes dehydration prevention counseling today. Alejandro [name] is at risk for dehydration due to weight loss and current medication(s). He was encouraged to hydrate and monitor fluid status to avoid dehydration as well as weight loss plateaus.   4. Class 1 obesity with serious comorbidity and body mass index (BMI) of 31.0 to 31.9 in adult, unspecified obesity type Alejandro [name] is currently in the action stage of change. As such, his goal is to continue with weight loss efforts. He has agreed to following a lower carbohydrate, vegetable and lean protein rich diet plan.   Exercise goals: As is.  Behavioral modification strategies: decreasing simple carbohydrates.  [name] has agreed to follow-up with our clinic in 3 weeks.  Objective:   Blood pressure 128/80, pulse 76, temperature 97.6 F (36.4 C), height 5\' 8"  (1.727 m), weight 205 lb (93 kg), SpO2 94 %. Body mass index is 31.17 kg/m.  General: Cooperative, alert, well developed, in no acute distress. HEENT: Conjunctivae and lids unremarkable. Cardiovascular: Regular rhythm.  Lungs: Normal work of breathing. Neurologic: No focal deficits.   Lab Results  Component Value Date   CREATININE 0.97 12/10/2019   BUN 12 12/10/2019   NA 142 12/10/2019   K 4.8 12/10/2019   CL 106 12/10/2019   CO2 22 12/10/2019   Lab Results  Component Value Date   ALT 46 (H) 12/10/2019   AST 28 12/10/2019   ALKPHOS 83 12/10/2019   BILITOT 0.4 12/10/2019   Lab Results  Component Value Date   HGBA1C 5.2 12/10/2019  HGBA1C 5.7 (H) 07/31/2019   HGBA1C 5.7 (H) 04/09/2019   Lab Results  Component Value Date   INSULIN 7.2 12/10/2019   INSULIN 32.3 (H) 07/31/2019   INSULIN 26.6 (H) 04/09/2019   Lab Results  Component Value Date   TSH 1.830 04/09/2019   Lab Results  Component Value Date   CHOL 187 12/10/2019   HDL 61 12/10/2019   LDLCALC 111 (H) 12/10/2019   LDLDIRECT 224.0 08/26/2017   TRIG 82 12/10/2019   CHOLHDL 6 08/26/2017   No  results found for: WBC, HGB, HCT, MCV, PLT No results found for: IRON, TIBC, FERRITIN  Attestation Statements:   Reviewed by clinician on day of visit: allergies, medications, problem list, medical history, surgical history, family history, social history, and previous encounter notes.   Wilhemena Durie, am acting as Location manager for [employer], FNP-C.  I have reviewed the above documentation for accuracy and completeness, and I agree with the above. -  Georgianne Fick, FNP

## 2020-03-03 ENCOUNTER — Ambulatory Visit (INDEPENDENT_AMBULATORY_CARE_PROVIDER_SITE_OTHER): Payer: 59 | Admitting: Family Medicine

## 2020-03-03 ENCOUNTER — Encounter (INDEPENDENT_AMBULATORY_CARE_PROVIDER_SITE_OTHER): Payer: Self-pay | Admitting: Family Medicine

## 2020-03-03 ENCOUNTER — Other Ambulatory Visit: Payer: Self-pay

## 2020-03-03 VITALS — BP 122/75 | HR 73 | Temp 98.0°F | Ht 68.0 in | Wt 200.0 lb

## 2020-03-03 DIAGNOSIS — E7849 Other hyperlipidemia: Secondary | ICD-10-CM | POA: Diagnosis not present

## 2020-03-03 DIAGNOSIS — Z6830 Body mass index (BMI) 30.0-30.9, adult: Secondary | ICD-10-CM | POA: Diagnosis not present

## 2020-03-03 DIAGNOSIS — E669 Obesity, unspecified: Secondary | ICD-10-CM

## 2020-03-03 DIAGNOSIS — F3289 Other specified depressive episodes: Secondary | ICD-10-CM

## 2020-03-03 MED ORDER — WEGOVY 0.5 MG/0.5ML ~~LOC~~ SOAJ: 0.5000 mg | SUBCUTANEOUS | 0 refills | Status: DC

## 2020-03-04 ENCOUNTER — Encounter (INDEPENDENT_AMBULATORY_CARE_PROVIDER_SITE_OTHER): Payer: Self-pay | Admitting: Family Medicine

## 2020-03-04 NOTE — Progress Notes (Signed)
Chief Complaint:   OBESITY Alejandro [name] is here to discuss his progress with his obesity treatment plan along with follow-up of his obesity related diagnoses. Alejandro [name] is on following a lower carbohydrate, vegetable and lean protein rich diet plan and states he is following his eating plan approximately 85% of the time. Alejandro [name] states he is doing cardio, strengthening, and yoga for 45 minutes 6-7 times per week.  Today's visit was #: 53 Starting weight: 254 lbs Starting date: 04/09/2019 Today's weight: 200 lbs Today's date: 03/03/2020 Total lbs lost to date: 54 Total lbs lost since last in-office visit: 5  Interim History:  He has gotten back on track and is down 5 lbs today. Alejandro [name] notes sweet cravings- especially for Frosted Flakes. He is on Wegovy 0.25 mg weekly and does not feel it helps much with appetite. He denies nausea or constipation.  Subjective:   1. Other hyperlipidemia Alejandro [name]'s last LDL has improved dramatically from 169 to 111. His HDL is at goal, and triglycerides have much improved. He had a high of 433, but triglycerides are now within normal limits. He is on atorvastatin 20 mg daily.  Lab Results  Component Value Date   ALT 46 (H) 12/10/2019   AST 28 12/10/2019   ALKPHOS 83 12/10/2019   BILITOT 0.4 12/10/2019   Lab Results  Component Value Date   CHOL 187 12/10/2019   HDL 61 12/10/2019   LDLCALC 111 (H) 12/10/2019   LDLDIRECT 224.0 08/26/2017   TRIG 82 12/10/2019   CHOLHDL 6 08/26/2017   2. Other depression with emotional eating  Alejandro [name] notes extreme sweets cravings in the evening. He is on bupropion.  Assessment/Plan:   1. Other hyperlipidemia . Alejandro [name] will continue Lipitor 20 mg daily.  2. Other depression with emotional eating  Behavior modification techniques were discussed today to help Alejandro [name] deal with his emotional/non-hunger eating behaviors. Alejandro [name] will continue bupropion.   3. Class 1 obesity with serious comorbidity and body mass  index (BMI) of 30.0 to 30.9 in adult, unspecified obesity type Alejandro [name] is currently in the action stage of change. As such, his goal is to continue with weight loss efforts. He has agreed to following a lower carbohydrate, vegetable and lean protein rich diet plan.   We discussed using a low carbohydrate sweet for sweet cravings.  We discussed various medication options to help Alejandro [name] with his weight loss efforts and we both agreed to increase Wegovy to 0.50 mg SubQ weekly with no refills.  - Semaglutide-Weight Management (WEGOVY) 0.5 MG/0.5ML SOAJ; Inject 0.5 mg into the skin once a week.  Dispense: 2 mL; Refill: 0  Exercise goals: As is.  Behavioral modification strategies: increasing lean protein intake and better snacking choices.  Alejandro [name] has agreed to follow-up with our clinic in 3 weeks. Objective:   Blood pressure 122/75, pulse 73, temperature 98 F (36.7 C), temperature source Oral, height 5\' 8"  (1.727 m), weight 200 lb (90.7 kg), SpO2 99 %. Body mass index is 30.41 kg/m.  General: Cooperative, alert, well developed, in no acute distress. HEENT: Conjunctivae and lids unremarkable. Cardiovascular: Regular rhythm.  Lungs: Normal work of breathing. Neurologic: No focal deficits.   Lab Results  Component Value Date   CREATININE 0.97 12/10/2019   BUN 12 12/10/2019   NA 142 12/10/2019   K 4.8 12/10/2019   CL 106 12/10/2019   CO2 22 12/10/2019   Lab Results  Component Value Date   ALT 46 (H) 12/10/2019   AST 28 12/10/2019  ALKPHOS 83 12/10/2019   BILITOT 0.4 12/10/2019   Lab Results  Component Value Date   HGBA1C 5.2 12/10/2019   HGBA1C 5.7 (H) 07/31/2019   HGBA1C 5.7 (H) 04/09/2019   Lab Results  Component Value Date   INSULIN 7.2 12/10/2019   INSULIN 32.3 (H) 07/31/2019   INSULIN 26.6 (H) 04/09/2019   Lab Results  Component Value Date   TSH 1.830 04/09/2019   Lab Results  Component Value Date   CHOL 187 12/10/2019   HDL 61 12/10/2019   LDLCALC 111  (H) 12/10/2019   LDLDIRECT 224.0 08/26/2017   TRIG 82 12/10/2019   CHOLHDL 6 08/26/2017   No results found for: WBC, HGB, HCT, MCV, PLT No results found for: IRON, TIBC, FERRITIN  Attestation Statements:   Reviewed by clinician on day of visit: allergies, medications, problem list, medical history, surgical history, family history, social history, and previous encounter notes.   Wilhemena Durie, am acting as Location manager for [employer], FNP-C.  I have reviewed the above documentation for accuracy and completeness, and I agree with the above. -  Georgianne Fick, FNP

## 2020-03-06 ENCOUNTER — Other Ambulatory Visit (INDEPENDENT_AMBULATORY_CARE_PROVIDER_SITE_OTHER): Payer: Self-pay | Admitting: Family Medicine

## 2020-03-06 DIAGNOSIS — E7849 Other hyperlipidemia: Secondary | ICD-10-CM

## 2020-03-06 NOTE — Telephone Encounter (Signed)
Refill protocol given to [facility]

## 2020-03-25 ENCOUNTER — Ambulatory Visit (INDEPENDENT_AMBULATORY_CARE_PROVIDER_SITE_OTHER): Payer: 59 | Admitting: Family Medicine

## 2020-04-01 ENCOUNTER — Encounter (INDEPENDENT_AMBULATORY_CARE_PROVIDER_SITE_OTHER): Payer: Self-pay | Admitting: Family Medicine

## 2020-04-01 ENCOUNTER — Other Ambulatory Visit: Payer: Self-pay

## 2020-04-01 ENCOUNTER — Ambulatory Visit (INDEPENDENT_AMBULATORY_CARE_PROVIDER_SITE_OTHER): Payer: 59 | Admitting: Family Medicine

## 2020-04-01 VITALS — BP 114/70 | HR 86 | Temp 97.9°F | Ht 68.0 in | Wt 201.0 lb

## 2020-04-01 DIAGNOSIS — Z6830 Body mass index (BMI) 30.0-30.9, adult: Secondary | ICD-10-CM | POA: Diagnosis not present

## 2020-04-01 DIAGNOSIS — K760 Fatty (change of) liver, not elsewhere classified: Secondary | ICD-10-CM | POA: Diagnosis not present

## 2020-04-01 DIAGNOSIS — Z9189 Other specified personal risk factors, not elsewhere classified: Secondary | ICD-10-CM

## 2020-04-01 DIAGNOSIS — E8881 Metabolic syndrome: Secondary | ICD-10-CM | POA: Diagnosis not present

## 2020-04-01 DIAGNOSIS — E669 Obesity, unspecified: Secondary | ICD-10-CM | POA: Diagnosis not present

## 2020-04-01 DIAGNOSIS — E88819 Insulin resistance, unspecified: Secondary | ICD-10-CM

## 2020-04-01 MED ORDER — METFORMIN HCL 500 MG PO TABS: 500.0000 mg | ORAL_TABLET | Freq: Every day | ORAL | 0 refills | Status: DC

## 2020-04-01 MED ORDER — WEGOVY 1 MG/0.5ML ~~LOC~~ SOAJ: 1.0000 mg | SUBCUTANEOUS | 0 refills | Status: DC

## 2020-04-02 ENCOUNTER — Encounter (INDEPENDENT_AMBULATORY_CARE_PROVIDER_SITE_OTHER): Payer: Self-pay | Admitting: Family Medicine

## 2020-04-02 NOTE — Progress Notes (Signed)
Chief Complaint:   OBESITY Alejandro [name] is here to discuss his progress with his obesity treatment plan along with follow-up of his obesity related diagnoses. Alejandro [name] is following a lower carbohydrate, vegetable and lean protein rich diet plan and states he is following his eating plan approximately 50% of the time. Alejandro [name] states he is doing HIIT, Crossfit, and yoga 45 minutes 7 times per week.  Today's visit was #: 82 Starting weight: 254 lbs Starting date: 04/09/2019 Today's weight: 201 lbs Today's date: 04/01/2020 Total lbs lost to date: 53 Total lbs lost since last in-office visit: 0  Interim History: Alejandro [name] just returned from vacation and is up 1 lb today. He was somewhat off plan on vacation and did indulge in some carbohydrates.[name] He is on Wegovy 1.0 mg weekly and reports occasional nausea with constipation.  Subjective:   Insulin resistance. Alejandro [name] has a diagnosis of insulin resistance based on his elevated fasting insulin level >5.  Alejandro [name] is on metformin 500 mg daily. Last A1c 5.2; previously prediabetic with an A1c of 5.7.  Lab Results  Component Value Date   INSULIN 7.2 12/10/2019   INSULIN 32.3 (H) 07/31/2019   INSULIN 26.6 (H) 04/09/2019   Lab Results  Component Value Date   HGBA1C 5.2 12/10/2019   Fatty liver. Liver enzymes are nearly normal; mild elevation of ALT at 46. Alejandro [name] has lost greater than 10% of body weight.  At risk for side effect of medication. [name] is at risk of side effects from increased dose of Wegovy.  Assessment/Plan:   Insulin resistance.  Refill was given for metFORMIN (GLUCOPHAGE) 500 MG tablet QAM #30 with 0 refills.  Fatty liver. Alejandro [name] will continue his meal plan as directed.   At risk for side effect of medication. Alejandro [name] was given approximately 15 minutes of drug side effect counseling today due to risk of SE due to increased dose of Wegovy. .  We discussed side effect possibility and risk versus benefits. Alejandro [name]  agreed to the medication and will contact this office if these side effects are intolerable.  Repetitive spaced learning was employed today to elicit superior memory formation and behavioral change.  Class 1 obesity with serious comorbidity and body mass index (BMI) of 30.0 to 30.9 in adult, unspecified obesity type. Alejandro [name] was given a refill on his Semaglutide-Weight Management (WEGOVY) 1 MG/0.5ML SOAJ weekly with 0 refills.  Alejandro [name] is currently in the action stage of change. As such, his goal is to continue with weight loss efforts. He has agreed to following a lower carbohydrate, vegetable and lean protein rich diet plan.   Exercise goals: Alejandro [name] will continue his current exercise regimen.   Behavioral modification strategies: decreasing simple carbohydrates and better snacking choices.  Alejandro [name] has agreed to follow-up with our clinic in 3 weeks.   Objective:   Blood pressure 114/70, pulse 86, temperature 97.9 F (36.6 C), height 5\' 8"  (1.727 m), weight 201 lb (91.2 kg), SpO2 98 %. Body mass index is 30.56 kg/m.  General: Cooperative, alert, well developed, in no acute distress. HEENT: Conjunctivae and lids unremarkable. Cardiovascular: Regular rhythm.  Lungs: Normal work of breathing. Neurologic: No focal deficits.   Lab Results  Component Value Date   CREATININE 0.97 12/10/2019   BUN 12 12/10/2019   NA 142 12/10/2019   K 4.8 12/10/2019   CL 106 12/10/2019   CO2 22 12/10/2019   Lab Results  Component Value Date   ALT 46 (H) 12/10/2019   AST 28 12/10/2019  ALKPHOS 83 12/10/2019   BILITOT 0.4 12/10/2019   Lab Results  Component Value Date   HGBA1C 5.2 12/10/2019   HGBA1C 5.7 (H) 07/31/2019   HGBA1C 5.7 (H) 04/09/2019   Lab Results  Component Value Date   INSULIN 7.2 12/10/2019   INSULIN 32.3 (H) 07/31/2019   INSULIN 26.6 (H) 04/09/2019   Lab Results  Component Value Date   TSH 1.830 04/09/2019   Lab Results  Component Value Date   CHOL 187 12/10/2019    HDL 61 12/10/2019   LDLCALC 111 (H) 12/10/2019   LDLDIRECT 224.0 08/26/2017   TRIG 82 12/10/2019   CHOLHDL 6 08/26/2017   No results found for: WBC, HGB, HCT, MCV, PLT No results found for: IRON, TIBC, FERRITIN  Attestation Statements:   Reviewed by clinician on day of visit: allergies, medications, problem list, medical history, surgical history, family history, social history, and previous encounter notes.  IMichaelene [name], am acting as Location manager for [employer], FNP-C   I have reviewed the above documentation for accuracy and completeness, and I agree with the above. -  Alejandro Fick, FNP

## 2020-04-14 ENCOUNTER — Other Ambulatory Visit (INDEPENDENT_AMBULATORY_CARE_PROVIDER_SITE_OTHER): Payer: Self-pay | Admitting: Family Medicine

## 2020-04-14 ENCOUNTER — Encounter (INDEPENDENT_AMBULATORY_CARE_PROVIDER_SITE_OTHER): Payer: Self-pay

## 2020-04-14 DIAGNOSIS — F3289 Other specified depressive episodes: Secondary | ICD-10-CM

## 2020-04-14 DIAGNOSIS — E7849 Other hyperlipidemia: Secondary | ICD-10-CM

## 2020-04-14 NOTE — Telephone Encounter (Signed)
Alejandro [name] pt

## 2020-04-14 NOTE — Telephone Encounter (Signed)
Message sent to pt to see if refills needed prior to next ov.

## 2020-04-21 ENCOUNTER — Encounter (INDEPENDENT_AMBULATORY_CARE_PROVIDER_SITE_OTHER): Payer: Self-pay

## 2020-04-21 ENCOUNTER — Ambulatory Visit (INDEPENDENT_AMBULATORY_CARE_PROVIDER_SITE_OTHER): Payer: 59 | Admitting: Family Medicine

## 2020-04-23 ENCOUNTER — Ambulatory Visit (INDEPENDENT_AMBULATORY_CARE_PROVIDER_SITE_OTHER): Payer: 59 | Admitting: Family Medicine

## 2020-04-23 ENCOUNTER — Other Ambulatory Visit: Payer: Self-pay

## 2020-04-23 VITALS — BP 111/76 | HR 80 | Temp 97.4°F | Ht 68.0 in | Wt 203.0 lb

## 2020-04-23 DIAGNOSIS — Z9189 Other specified personal risk factors, not elsewhere classified: Secondary | ICD-10-CM

## 2020-04-23 DIAGNOSIS — Z6830 Body mass index (BMI) 30.0-30.9, adult: Secondary | ICD-10-CM | POA: Diagnosis not present

## 2020-04-23 DIAGNOSIS — F3289 Other specified depressive episodes: Secondary | ICD-10-CM | POA: Diagnosis not present

## 2020-04-23 DIAGNOSIS — E669 Obesity, unspecified: Secondary | ICD-10-CM | POA: Diagnosis not present

## 2020-04-23 DIAGNOSIS — E7849 Other hyperlipidemia: Secondary | ICD-10-CM

## 2020-04-23 MED ORDER — WEGOVY 1.7 MG/0.75ML ~~LOC~~ SOAJ: 1.7000 mg | SUBCUTANEOUS | 0 refills | Status: DC

## 2020-04-23 MED ORDER — ATORVASTATIN CALCIUM 20 MG PO TABS: 20.0000 mg | ORAL_TABLET | Freq: Every day | ORAL | 0 refills | Status: DC

## 2020-04-23 MED ORDER — ESCITALOPRAM OXALATE 20 MG PO TABS: 20.0000 mg | ORAL_TABLET | Freq: Every day | ORAL | 0 refills | Status: DC

## 2020-04-23 NOTE — Progress Notes (Signed)
Chief Complaint:   OBESITY Alejandro [name] is here to discuss his progress with his obesity treatment plan along with follow-up of his obesity related diagnoses. Alejandro [name] is on following a lower carbohydrate, vegetable and lean protein rich diet plan and states he is following his eating plan approximately 50% of the time. Alejandro [name] states he is doing yoga, F45, and HIIT for 45 minutes 7 times per week.  Today's visit was #: 20 Starting weight: 254 lbs Starting date: 04/09/2019 Today's weight: 203 lbs Today's date: 04/23/2020 Total lbs lost to date: 51 Total lbs lost since last in-office visit: 0  Interim History: Alejandro [name] says he has been very busy which has lead to less cooking. He has had access to foods at special events, and has indulged. He also tends to eat cereal at night. More carbs have crept in to his diet over the past few months. He is working on Designer, fashion/clothing.   Subjective:   1. Other hyperlipidemia Alejandro [name]'s LDL is now 111 (decreased from 169), and triglycerides are 82 (down from 433). He is on statin. He has done a tremendous job improving his lipids with weight loss and exercise.   Lab Results  Component Value Date   ALT 46 (H) 12/10/2019   AST 28 12/10/2019   ALKPHOS 83 12/10/2019   BILITOT 0.4 12/10/2019   Lab Results  Component Value Date   CHOL 187 12/10/2019   HDL 61 12/10/2019   LDLCALC 111 (H) 12/10/2019   LDLDIRECT 224.0 08/26/2017   TRIG 82 12/10/2019   CHOLHDL 6 08/26/2017   2. Other depression, with emotional eating Alejandro [name]'s mood is stable on bupropion and Lexapro.  3. At risk for side effect of medication Alejandro [name] is at risk for drug side effects due to increased dose of Wegovy.  Assessment/Plan:   1. Other hyperlipidemia . We will refill Lipitor for 1 month. Orders and follow up as documented in patient record.    - atorvastatin (LIPITOR) 20 MG tablet; Take 1 tablet (20 mg total) by mouth daily.  Dispense: 30 tablet; Refill: 0  2. Other  depression, with emotional eating Behavior modification techniques were discussed today to help Alejandro [name] deal with his emotional/non-hunger eating behaviors. We will refill Lexapro for 1 month. Orders and follow up as documented in patient record.   - escitalopram (LEXAPRO) 20 MG tablet; Take 1 tablet (20 mg total) by mouth daily.  Dispense: 30 tablet; Refill: 0  3. At risk for side effect of medication Alejandro [name] was given approximately 15 minutes of drug side effect counseling today due to increased doe of Wegovy.   We discussed side effect possibility and risk versus benefits. Alejandro [name] agreed to the medication and will contact this office if these side effects are intolerable.  Repetitive spaced learning was employed today to elicit superior memory formation and behavioral change.  4. Class 1 obesity with serious comorbidity and body mass index (BMI) of 30.0 to 30.9 in adult, unspecified obesity type Alejandro [name] is currently in the action stage of change. As such, his goal is to continue with weight loss efforts. He has agreed to following a lower carbohydrate, vegetable and lean protein rich diet plan.   Alejandro [name] will track his carbohydrates/calories and stay <40 grams and <2,000 calories per day.  We discussed various medication options to help Alejandro [name] with his weight loss efforts and we both agreed to continue [facility] and we will refill for 1 month.  - Semaglutide-Weight Management (WEGOVY) 1.7 MG/0.75ML SOAJ; Inject 1.7 mg into  the skin once a week.  Dispense: 3 mL; Refill: 0  Exercise goals: As is.  Behavioral modification strategies: increasing lean protein intake and increasing water intake.  Alejandro [name] has agreed to follow-up with our clinic in 3 weeks.   Objective:   Blood pressure 111/76, pulse 80, temperature (!) 97.4 F (36.3 C), height 5\' 8"  (1.727 m), weight 203 lb (92.1 kg), SpO2 100 %. Body mass index is 30.87 kg/m.  General: Cooperative, alert, well developed, in no acute  distress. HEENT: Conjunctivae and lids unremarkable. Cardiovascular: Regular rhythm.  Lungs: Normal work of breathing. Neurologic: No focal deficits.   Lab Results  Component Value Date   CREATININE 0.97 12/10/2019   BUN 12 12/10/2019   NA 142 12/10/2019   K 4.8 12/10/2019   CL 106 12/10/2019   CO2 22 12/10/2019   Lab Results  Component Value Date   ALT 46 (H) 12/10/2019   AST 28 12/10/2019   ALKPHOS 83 12/10/2019   BILITOT 0.4 12/10/2019   Lab Results  Component Value Date   HGBA1C 5.2 12/10/2019   HGBA1C 5.7 (H) 07/31/2019   HGBA1C 5.7 (H) 04/09/2019   Lab Results  Component Value Date   INSULIN 7.2 12/10/2019   INSULIN 32.3 (H) 07/31/2019   INSULIN 26.6 (H) 04/09/2019   Lab Results  Component Value Date   TSH 1.830 04/09/2019   Lab Results  Component Value Date   CHOL 187 12/10/2019   HDL 61 12/10/2019   LDLCALC 111 (H) 12/10/2019   LDLDIRECT 224.0 08/26/2017   TRIG 82 12/10/2019   CHOLHDL 6 08/26/2017   No results found for: WBC, HGB, HCT, MCV, PLT No results found for: IRON, TIBC, FERRITIN  Attestation Statements:   Reviewed by clinician on day of visit: allergies, medications, problem list, medical history, surgical history, family history, social history, and previous encounter notes.   Wilhemena Durie, am acting as Location manager for [employer], FNP-C.  I have reviewed the above documentation for accuracy and completeness, and I agree with the above. -  Georgianne Fick, FNP

## 2020-04-24 ENCOUNTER — Encounter (INDEPENDENT_AMBULATORY_CARE_PROVIDER_SITE_OTHER): Payer: Self-pay | Admitting: Family Medicine

## 2020-05-12 NOTE — Telephone Encounter (Signed)
Last seen by Dawn  

## 2020-05-14 ENCOUNTER — Ambulatory Visit (INDEPENDENT_AMBULATORY_CARE_PROVIDER_SITE_OTHER): Payer: 59 | Admitting: Family Medicine

## 2020-05-14 ENCOUNTER — Encounter (INDEPENDENT_AMBULATORY_CARE_PROVIDER_SITE_OTHER): Payer: Self-pay | Admitting: Family Medicine

## 2020-05-14 ENCOUNTER — Other Ambulatory Visit: Payer: Self-pay

## 2020-05-14 VITALS — BP 119/72 | HR 89 | Temp 97.7°F | Ht 68.0 in | Wt 194.0 lb

## 2020-05-14 DIAGNOSIS — F3289 Other specified depressive episodes: Secondary | ICD-10-CM

## 2020-05-14 DIAGNOSIS — Z6830 Body mass index (BMI) 30.0-30.9, adult: Secondary | ICD-10-CM

## 2020-05-14 DIAGNOSIS — E669 Obesity, unspecified: Secondary | ICD-10-CM | POA: Diagnosis not present

## 2020-05-14 DIAGNOSIS — E7849 Other hyperlipidemia: Secondary | ICD-10-CM

## 2020-05-14 MED ORDER — WEGOVY 1.7 MG/0.75ML ~~LOC~~ SOAJ: 1.7000 mg | SUBCUTANEOUS | 0 refills | Status: DC

## 2020-05-15 ENCOUNTER — Encounter (INDEPENDENT_AMBULATORY_CARE_PROVIDER_SITE_OTHER): Payer: Self-pay | Admitting: Family Medicine

## 2020-05-15 NOTE — Progress Notes (Signed)
Chief Complaint:   OBESITY Alejandro [name] is here to discuss his progress with his obesity treatment plan along with follow-up of his obesity related diagnoses. Alejandro [name] is on following a lower carbohydrate, vegetable and lean protein rich diet plan and states he is following his eating plan approximately 90% of the time. Alejandro [name] states he is doing cardio, lifting weights, and doing yoga for 45-60 minutes 7 times per week.  Today's visit was #: 21 Starting weight: 254 lbs Starting date: 04/09/2019 Today's weight: 194 lbs Today's date: 05/14/2020 Total lbs lost to date: 60 lbs Total lbs lost since last in-office visit: 9 lbs  Interim History: Alejandro [name] has been much stricter on the plan over the past few weeks.  He said has found his "mojo" again.  He says he is eating a lot of fish, vegetables, and [place].  He says his water intake is good.  Goal is 190 pounds by the end of the year.  Body fat % is down to 17% and visceral fat is down to 7 from 16. Alejandro [name] is working well at 1.7 mg weekly.  Subjective:   1. Other hyperlipidemia LDL is 111 (down from 169).  HDL and triglycerides within normal limits.  triglycerides had been 433.  He is on Lipitor 20 mg daily.  Lab Results  Component Value Date   ALT 46 (H) 12/10/2019   AST 28 12/10/2019   ALKPHOS 83 12/10/2019   BILITOT 0.4 12/10/2019   Lab Results  Component Value Date   CHOL 187 12/10/2019   HDL 61 12/10/2019   LDLCALC 111 (H) 12/10/2019   LDLDIRECT 224.0 08/26/2017   TRIG 82 12/10/2019   CHOLHDL 6 08/26/2017   2. Other depression, with emotional eating He feels he has "overcome cravings", in a way, by knowing how cravings occur. He read a book "Dopamine [place]" which has helped him to understand cravings and addiction.   Assessment/Plan:   1. Other hyperlipidemia Continue Lipitor 20 mg daily.  2. Other depression, with emotional eating Continue bupropion.  3. Class 1 obesity with serious comorbidity and body mass index  (BMI) of 30.0 to 30.9 in adult, unspecified obesity type  -Refill Semaglutide-Weight Management (WEGOVY) 1.7 MG/0.75ML SOAJ; Inject 1.7 mg into the skin once a week.  Dispense: 3 mL; Refill: 0  Alejandro [name] is currently in the action stage of change. As such, his goal is to continue with weight loss efforts. He has agreed to following a lower carbohydrate, vegetable and lean protein rich diet plan.  Continue Wegovy at 1.7 mg/weekly.  Exercise goals: As is.  Behavioral modification strategies: planning for success.  Alejandro [name] has agreed to follow-up with our clinic in 3 weeks.  Objective:   Blood pressure 119/72, pulse 89, temperature 97.7 F (36.5 C), height 5\' 8"  (1.727 m), weight 194 lb (88 kg), SpO2 98 %. Body mass index is 29.5 kg/m.  General: Cooperative, alert, well developed, in no acute distress. HEENT: Conjunctivae and lids unremarkable. Cardiovascular: Regular rhythm.  Lungs: Normal work of breathing. Neurologic: No focal deficits.   Lab Results  Component Value Date   CREATININE 0.97 12/10/2019   BUN 12 12/10/2019   NA 142 12/10/2019   K 4.8 12/10/2019   CL 106 12/10/2019   CO2 22 12/10/2019   Lab Results  Component Value Date   ALT 46 (H) 12/10/2019   AST 28 12/10/2019   ALKPHOS 83 12/10/2019   BILITOT 0.4 12/10/2019   Lab Results  Component Value Date   HGBA1C  5.2 12/10/2019   HGBA1C 5.7 (H) 07/31/2019   HGBA1C 5.7 (H) 04/09/2019   Lab Results  Component Value Date   INSULIN 7.2 12/10/2019   INSULIN 32.3 (H) 07/31/2019   INSULIN 26.6 (H) 04/09/2019   Lab Results  Component Value Date   TSH 1.830 04/09/2019   Lab Results  Component Value Date   CHOL 187 12/10/2019   HDL 61 12/10/2019   LDLCALC 111 (H) 12/10/2019   LDLDIRECT 224.0 08/26/2017   TRIG 82 12/10/2019   CHOLHDL 6 08/26/2017   Attestation Statements:   Reviewed by clinician on day of visit: allergies, medications, problem list, medical history, surgical history, family history, social  history, and previous encounter notes.  I, Water quality scientist, CMA, am acting as Location manager for [employer], [place].  I have reviewed the above documentation for accuracy and completeness, and I agree with the above. -  Georgianne Fick, FNP

## 2020-06-04 ENCOUNTER — Ambulatory Visit (INDEPENDENT_AMBULATORY_CARE_PROVIDER_SITE_OTHER): Payer: 59 | Admitting: Family Medicine

## 2020-06-04 ENCOUNTER — Encounter (INDEPENDENT_AMBULATORY_CARE_PROVIDER_SITE_OTHER): Payer: Self-pay | Admitting: Family Medicine

## 2020-06-04 ENCOUNTER — Other Ambulatory Visit: Payer: Self-pay

## 2020-06-04 VITALS — BP 128/75 | HR 70 | Temp 97.8°F | Ht 68.0 in | Wt 200.0 lb

## 2020-06-04 DIAGNOSIS — E669 Obesity, unspecified: Secondary | ICD-10-CM | POA: Diagnosis not present

## 2020-06-04 DIAGNOSIS — E559 Vitamin D deficiency, unspecified: Secondary | ICD-10-CM | POA: Diagnosis not present

## 2020-06-04 DIAGNOSIS — E782 Mixed hyperlipidemia: Secondary | ICD-10-CM

## 2020-06-04 DIAGNOSIS — E8881 Metabolic syndrome: Secondary | ICD-10-CM | POA: Diagnosis not present

## 2020-06-04 DIAGNOSIS — E88819 Insulin resistance, unspecified: Secondary | ICD-10-CM

## 2020-06-04 DIAGNOSIS — Z6830 Body mass index (BMI) 30.0-30.9, adult: Secondary | ICD-10-CM

## 2020-06-04 MED ORDER — ATORVASTATIN CALCIUM 20 MG PO TABS: 20.0000 mg | ORAL_TABLET | Freq: Every day | ORAL | 0 refills | Status: DC

## 2020-06-04 NOTE — Progress Notes (Signed)
Chief Complaint:   OBESITY Alejandro [name] is here to discuss his progress with his obesity treatment plan along with follow-up of his obesity related diagnoses. Alejandro [name] is on following a lower carbohydrate, vegetable and lean protein rich diet plan and states he is following his eating plan approximately 60% of the time. Cary states he is doing cardio, yoga, and weights for 45-60 minutes 6 times per week.  Today's visit was #: 22 Starting weight: 254 lbs Starting date: 04/09/2019 Today's weight: 200 lbs Today's date: 06/04/2020 Total lbs lost to date: 54 lbs Total lbs lost since last in-office visit: 0  Interim History: Tiler has been traveling lot and had a lot of birthday celebrations recently He has been eating quite a few carbohydrates. 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  He has done very well on our program and has lost a total of 54 lbs despite gaining 6 lbs today. He really wants to get back on plan. He will be going to the beach for Christmas with his family and his parents. His mother tends to go completely overboard with food preparation so there will be a great deal of temptation.  He is on Wegovy 1.7 mg weekly and tolerating it well. We are getting close to the end of his 6 month discount for [facility] .  Subjective:   1. Mixed hyperlipidemia LDL is 111 (previously 169).  Triglycerides 82 (previously 433).  HDL within normal limits. He is on 20 mg of atorvastatin.  Lab Results  Component Value Date   ALT 46 (H) 12/10/2019   AST 28 12/10/2019   ALKPHOS 83 12/10/2019   BILITOT 0.4 12/10/2019   Lab Results  Component Value Date   CHOL 187 12/10/2019   HDL 61 12/10/2019   LDLCALC 111 (H) 12/10/2019   LDLDIRECT 224.0 08/26/2017   TRIG 82 12/10/2019   CHOLHDL 6 08/26/2017   2. Vitamin D  deficiency Last vitamin D was 58.7.  He is not on a supplement currently.  3. Insulin resistance Last A1c was 5.2.  He was previously prediabetic.  On metformin daily.  Lab Results  Component Value Date   INSULIN 7.2 12/10/2019   INSULIN 32.3 (H) 07/31/2019   INSULIN 26.6 (H) 04/09/2019   Lab Results  Component Value Date   HGBA1C 5.2 12/10/2019   Assessment/Plan:   1. Mixed hyperlipidemia Will check FLP today.  Refill atorvastatin 20 mg daily, as per below.  - Lipid Panel With LDL/HDL Ratio - atorvastatin (LIPITOR) 20 MG tablet; Take 1 tablet (20 mg total) by mouth daily.  Dispense: 30 tablet; Refill: 0  2. Vitamin D deficiency Will check vitamin D level today, as per below.  - VITAMIN D 25 Hydroxy (Vit-D Deficiency, Fractures)  3. Insulin resistance Continue metformin.  Check A1c, fasting glucose/insulin, as per below.  - Comprehensive metabolic panel - Hemoglobin A1c - Insulin, random  4. Class 1 obesity with serious comorbidity and body mass index (BMI) of 30.0 to 30.9 in adult, unspecified obesity type  Kristofor is currently in the action stage of change. As such, his goal is to continue with weight loss efforts. He has agreed to following a lower carbohydrate, vegetable and lean protein rich diet plan.   He will buy his own groceries for the beach. He will get  back on the low carb plan today and in order to be "detoxed" off carbs by Saturday we he leaves for his trip.   Continue Wegovy 1.7 mg subcutaneously weekly.  Exercise goals: As is.  Behavioral modification strategies: decreasing simple carbohydrates and meal planning and cooking strategies.  Simmie has agreed to follow-up with our clinic in 3 weeks.  Dyllin was informed we would discuss his lab results at his next visit unless there is a critical issue that needs to be addressed sooner. Bandon agreed to keep his next visit at the agreed upon time to discuss these results.  Objective:   Blood  pressure 128/75, pulse 70, temperature 97.8 F (36.6 C), height 5\' 8"  (1.727 m), weight 200 lb (90.7 kg), SpO2 98 %. Body mass index is 30.41 kg/m.  General: Cooperative, alert, well developed, in no acute distress. HEENT: Conjunctivae and lids unremarkable. Cardiovascular: Regular rhythm.  Lungs: Normal work of breathing. Neurologic: No focal deficits.   Lab Results  Component Value Date   CREATININE 0.97 12/10/2019   BUN 12 12/10/2019   NA 142 12/10/2019   K 4.8 12/10/2019   CL 106 12/10/2019   CO2 22 12/10/2019   Lab Results  Component Value Date   ALT 46 (H) 12/10/2019   AST 28 12/10/2019   ALKPHOS 83 12/10/2019   BILITOT 0.4 12/10/2019   Lab Results  Component Value Date   HGBA1C 5.2 12/10/2019   HGBA1C 5.7 (H) 07/31/2019   HGBA1C 5.7 (H) 04/09/2019   Lab Results  Component Value Date   INSULIN 7.2 12/10/2019   INSULIN 32.3 (H) 07/31/2019   INSULIN 26.6 (H) 04/09/2019   Lab Results  Component Value Date   TSH 1.830 04/09/2019   Lab Results  Component Value Date   CHOL 187 12/10/2019   HDL 61 12/10/2019   LDLCALC 111 (H) 12/10/2019   LDLDIRECT 224.0 08/26/2017   TRIG 82 12/10/2019   CHOLHDL 6 08/26/2017   Attestation Statements:   Reviewed by clinician on day of visit: allergies, medications, problem list, medical history, surgical history, family history, social history, and previous encounter notes.  I, Water quality scientist, CMA, am acting as Location manager for [employer], [place].  I have reviewed the above documentation for accuracy and completeness, and I agree with the above. -  Georgianne Fick, FNP

## 2020-06-05 LAB — COMPREHENSIVE METABOLIC PANEL
ALT: 26 IU/L (ref 0–44)
AST: 19 IU/L (ref 0–40)
Albumin/Globulin Ratio: 2.3 — ABNORMAL HIGH (ref 1.2–2.2)
Albumin: 5.1 g/dL — ABNORMAL HIGH (ref 4.0–5.0)
Alkaline Phosphatase: 75 IU/L (ref 44–121)
BUN/Creatinine Ratio: 14 (ref 9–20)
BUN: 14 mg/dL (ref 6–24)
Bilirubin Total: 0.5 mg/dL (ref 0.0–1.2)
CO2: 21 mmol/L (ref 20–29)
Calcium: 9.6 mg/dL (ref 8.7–10.2)
Chloride: 100 mmol/L (ref 96–106)
Creatinine, Ser: 0.97 mg/dL (ref 0.76–1.27)
GFR calc Af Amer: 112 mL/min/{1.73_m2} (ref >59)
GFR calc non Af Amer: 97 mL/min/{1.73_m2} (ref >59)
Globulin, Total: 2.2 g/dL (ref 1.5–4.5)
Glucose: 86 mg/dL (ref 65–99)
Potassium: 4.3 mmol/L (ref 3.5–5.2)
Sodium: 140 mmol/L (ref 134–144)
Total Protein: 7.3 g/dL (ref 6.0–8.5)

## 2020-06-05 LAB — LIPID PANEL WITH LDL/HDL RATIO
Cholesterol, Total: 263 mg/dL — ABNORMAL HIGH (ref 100–199)
HDL: 75 mg/dL (ref >39)
LDL Chol Calc (NIH): 168 mg/dL — ABNORMAL HIGH (ref 0–99)
LDL/HDL Ratio: 2.2 ratio (ref 0.0–3.6)
Triglycerides: 117 mg/dL (ref 0–149)
VLDL Cholesterol Cal: 20 mg/dL (ref 5–40)

## 2020-06-05 LAB — HEMOGLOBIN A1C
Est. average glucose Bld gHb Est-mCnc: 105 mg/dL
Hgb A1c MFr Bld: 5.3 % (ref 4.8–5.6)

## 2020-06-05 LAB — VITAMIN D 25 HYDROXY (VIT D DEFICIENCY, FRACTURES): Vit D, 25-Hydroxy: 24.8 ng/mL — ABNORMAL LOW (ref 30.0–100.0)

## 2020-06-05 LAB — INSULIN, RANDOM: INSULIN: 14.2 u[IU]/mL (ref 2.6–24.9)

## 2020-06-11 ENCOUNTER — Other Ambulatory Visit (INDEPENDENT_AMBULATORY_CARE_PROVIDER_SITE_OTHER): Payer: Self-pay | Admitting: Family Medicine

## 2020-06-11 DIAGNOSIS — F3289 Other specified depressive episodes: Secondary | ICD-10-CM

## 2020-06-23 ENCOUNTER — Encounter (INDEPENDENT_AMBULATORY_CARE_PROVIDER_SITE_OTHER): Payer: Self-pay

## 2020-06-23 ENCOUNTER — Ambulatory Visit (INDEPENDENT_AMBULATORY_CARE_PROVIDER_SITE_OTHER): Payer: 59 | Admitting: Family Medicine

## 2020-06-26 ENCOUNTER — Encounter (INDEPENDENT_AMBULATORY_CARE_PROVIDER_SITE_OTHER): Payer: Self-pay

## 2020-06-30 ENCOUNTER — Encounter (INDEPENDENT_AMBULATORY_CARE_PROVIDER_SITE_OTHER): Payer: Self-pay | Admitting: Family Medicine

## 2020-06-30 ENCOUNTER — Telehealth (INDEPENDENT_AMBULATORY_CARE_PROVIDER_SITE_OTHER): Payer: 59 | Admitting: Family Medicine

## 2020-06-30 DIAGNOSIS — Z6830 Body mass index (BMI) 30.0-30.9, adult: Secondary | ICD-10-CM

## 2020-06-30 DIAGNOSIS — E559 Vitamin D deficiency, unspecified: Secondary | ICD-10-CM

## 2020-06-30 DIAGNOSIS — F3289 Other specified depressive episodes: Secondary | ICD-10-CM | POA: Diagnosis not present

## 2020-06-30 DIAGNOSIS — E669 Obesity, unspecified: Secondary | ICD-10-CM

## 2020-06-30 MED ORDER — VITAMIN D (ERGOCALCIFEROL) 1.25 MG (50000 UNIT) PO CAPS: 50000.0000 [IU] | ORAL_CAPSULE | ORAL | 0 refills | Status: DC

## 2020-06-30 MED ORDER — WEGOVY 2.4 MG/0.75ML ~~LOC~~ SOAJ: 2.4000 mg | SUBCUTANEOUS | 0 refills | Status: DC

## 2020-06-30 MED ORDER — BUPROPION HCL ER (SR) 200 MG PO TB12: 200.0000 mg | ORAL_TABLET | Freq: Two times a day (BID) | ORAL | 0 refills | Status: DC

## 2020-07-03 ENCOUNTER — Encounter (INDEPENDENT_AMBULATORY_CARE_PROVIDER_SITE_OTHER): Payer: Self-pay | Admitting: Family Medicine

## 2020-07-03 NOTE — Progress Notes (Signed)
TeleHealth Visit:  Due to the COVID-19 pandemic, this visit was completed with telemedicine (audio/video) technology to reduce patient and provider exposure as well as to preserve personal protective equipment.   Alejandro [name] has verbally consented to this TeleHealth visit. The patient is located at home, the provider is located at the [facility] office. The participants in this visit include the listed provider and patient . The visit was conducted today via MyChart Video.     Chief Complaint: OBESITY Alejandro [name] is here to discuss his progress with his obesity treatment plan along with follow-up of his obesity related diagnoses. Alejandro [name] is on following a lower carbohydrate, vegetable and lean protein rich diet plan and states he is following his eating plan approximately 75% of the time. Alejandro [name] states he is doing cardio and yoga 60 minutes 4 1/2 times per week.   Today's visit was #: 23 Starting weight: 254 lbs Starting date: 04/09/2019  Interim History: This is Alejandro [name]'s first appointment since Christmas. He reports a weight of 206 lbs (up 6 lbs since last office visit (06/04/2020). Alejandro [name] notes Christmas and his recent beach trip made it very hard to stick to plan very hard due to abundant food availability. He is currently totally off plan but continues to work out.  Subjective:   1.  Other depression with emotional eating  . Cravings for carbs have worsened. He is on bupropion 200 mg BID.  2.  Vitamin D deficiency  Alejandro [name]'s Vitamin D level was 24.8 on 06/04/2020. Vitamin D has decreased to 24.8 from 58. He is currently taking OTC vitamin D 5000 IU each day. He denies nausea, vomiting or muscle weakness.  Assessment/Plan:   1. Other depression with emotional eating   Refill bupropion 200 mg BID #60 No refill.  - buPROPion (WELLBUTRIN SR) 200 MG 12 hr tablet; Take 1 tablet (200 mg total) by mouth 2 (two) times daily.  Dispense: 60 tablet; Refill: 0  2. Vitamin D  deficiency He agrees to continue to take prescription Vitamin D @50 ,000 IU every week and will follow-up for routine testing of Vitamin D, at least 2-3 times per year to avoid over-replacement. Refill Vitamin D 50,000 Unit weekly #4. No refill.  - Vitamin D, Ergocalciferol, (DRISDOL) 1.25 MG (50000 UNIT) CAPS capsule; Take 1 capsule (50,000 Units total) by mouth every 7 (seven) days.  Dispense: 4 capsule; Refill: 0  3. Class 1 obesity with serious comorbidity and body mass index (BMI) of 30.0 to 30.9 in adult, unspecified obesity type  Clearence is currently in the action stage of change. As such, his goal is to continue with weight loss efforts. He has agreed to following a lower carbohydrate, vegetable and lean protein rich diet plan. I advised he should try to avoid low/carb/keto products and stick to vegetables, egg, cheese, and meat.   - Semaglutide-Weight Management (WEGOVY) 2.4 MG/0.75ML SOAJ; Inject 2.4 mg into the skin once a week.  Dispense: 3 mL; Refill: 0  Exercise goals: As is.  Behavioral modification strategies: decreasing simple carbohydrates and meal planning and cooking strategies.  Whitney has agreed to follow-up with our clinic in 2-3 weeks. He was informed of the importance of frequent follow-up visits to maximize his success with intensive lifestyle modifications for his multiple health conditions.   Objective:   VITALS: Per patient if applicable, see vitals. GENERAL: Alert and in no acute distress. CARDIOPULMONARY: No increased WOB. Speaking in clear sentences.  PSYCH: Pleasant and cooperative. Speech normal rate and rhythm.  Affect is appropriate. Insight and judgement are appropriate. Attention is focused, linear, and appropriate.  NEURO: Oriented as arrived to appointment on time with no prompting.   Lab Results  Component Value Date   CREATININE 0.97 06/04/2020   BUN 14 06/04/2020   NA 140 06/04/2020   K 4.3 06/04/2020   CL 100 06/04/2020   CO2 21 06/04/2020    Lab Results  Component Value Date   ALT 26 06/04/2020   AST 19 06/04/2020   ALKPHOS 75 06/04/2020   BILITOT 0.5 06/04/2020   Lab Results  Component Value Date   HGBA1C 5.3 06/04/2020   HGBA1C 5.2 12/10/2019   HGBA1C 5.7 (H) 07/31/2019   HGBA1C 5.7 (H) 04/09/2019   Lab Results  Component Value Date   INSULIN 14.2 06/04/2020   INSULIN 7.2 12/10/2019   INSULIN 32.3 (H) 07/31/2019   INSULIN 26.6 (H) 04/09/2019   Lab Results  Component Value Date   TSH 1.830 04/09/2019   Lab Results  Component Value Date   CHOL 263 (H) 06/04/2020   HDL 75 06/04/2020   LDLCALC 168 (H) 06/04/2020   LDLDIRECT 224.0 08/26/2017   TRIG 117 06/04/2020   CHOLHDL 6 08/26/2017   No results found for: WBC, HGB, HCT, MCV, PLT No results found for: IRON, TIBC, FERRITIN  Attestation Statements:   Reviewed by clinician on day of visit: allergies, medications, problem list, medical history, surgical history, family history, social history, and previous encounter notes.    Tula Nakayama, am acting as Location manager for Georgianne Fick, FNP  I have reviewed the above documentation for accuracy and completeness, and I agree with the above. - Georgianne Fick, FNP

## 2020-07-16 ENCOUNTER — Encounter (INDEPENDENT_AMBULATORY_CARE_PROVIDER_SITE_OTHER): Payer: Self-pay

## 2020-07-17 ENCOUNTER — Ambulatory Visit (INDEPENDENT_AMBULATORY_CARE_PROVIDER_SITE_OTHER): Payer: 59 | Admitting: Family Medicine

## 2020-07-29 ENCOUNTER — Encounter (INDEPENDENT_AMBULATORY_CARE_PROVIDER_SITE_OTHER): Payer: Self-pay

## 2020-08-21 ENCOUNTER — Ambulatory Visit (INDEPENDENT_AMBULATORY_CARE_PROVIDER_SITE_OTHER): Payer: 59 | Admitting: Family Medicine

## 2020-08-21 ENCOUNTER — Other Ambulatory Visit: Payer: Self-pay

## 2020-08-21 ENCOUNTER — Encounter (INDEPENDENT_AMBULATORY_CARE_PROVIDER_SITE_OTHER): Payer: Self-pay | Admitting: Family Medicine

## 2020-08-21 VITALS — BP 116/72 | HR 85 | Temp 98.1°F | Ht 68.0 in | Wt 211.0 lb

## 2020-08-21 DIAGNOSIS — E6609 Other obesity due to excess calories: Secondary | ICD-10-CM

## 2020-08-21 DIAGNOSIS — E782 Mixed hyperlipidemia: Secondary | ICD-10-CM

## 2020-08-21 DIAGNOSIS — Z6832 Body mass index (BMI) 32.0-32.9, adult: Secondary | ICD-10-CM | POA: Diagnosis not present

## 2020-08-21 DIAGNOSIS — Z9189 Other specified personal risk factors, not elsewhere classified: Secondary | ICD-10-CM

## 2020-08-21 DIAGNOSIS — R7303 Prediabetes: Secondary | ICD-10-CM | POA: Diagnosis not present

## 2020-08-21 MED ORDER — ATORVASTATIN CALCIUM 40 MG PO TABS: 40.0000 mg | ORAL_TABLET | Freq: Every day | ORAL | 3 refills | Status: DC

## 2020-08-21 NOTE — Progress Notes (Signed)
The 10-year ASCVD risk score (Goff DC Jr., et al., 2013) is: 1%   Values used to calculate the score:     Age: 39 years     Sex: Male     Is Non-Hispanic African American: No     Diabetic: No     Tobacco smoker: No     Systolic Blood Pressure: 116 mmHg     Is BP treated: No     HDL Cholesterol: 75 mg/dL     Total Cholesterol: 263 mg/dL  

## 2020-08-25 ENCOUNTER — Encounter (INDEPENDENT_AMBULATORY_CARE_PROVIDER_SITE_OTHER): Payer: Self-pay | Admitting: Family Medicine

## 2020-08-25 NOTE — Progress Notes (Signed)
Chief Complaint:   OBESITY Alejandro [name] is here to discuss his progress with his obesity treatment plan along with follow-up of his obesity related diagnoses. Alejandro [name] is on following a lower carbohydrate, vegetable and lean protein rich diet plan and states he is following his eating plan approximately 20% of the time. Alejandro [name] states he is doing cardio and weights 45 minutes 7 times per week.  Today's visit was #: 24 Starting weight: 254 lbs Starting date: 04/09/2019 Today's weight: 211 lbs Today's date: 08/21/2020 Total lbs lost to date: 43 Total lbs lost since last in-office visit: 0  Interim History: Alejandro [name] has not had an OV since 06/04/2020 and he is up 11 lbs. He says that ife has been chaotic over the past few months with a job change, travel, etc. He is still happy with low carb plan. He is going to [place] for a week this weekend. He has been off [place] since his discount for this ended. He will have new insurance next month. His body fat % is 21.77. His visceral fat is down from 16 to 10.   Subjective:   1. Mixed hyperlipidemia Tayari's LDL is not at goal. It has increased to 168 from 111. He is on atorvastatin 20 mg.   Lab Results  Component Value Date   ALT 26 06/04/2020   AST 19 06/04/2020   ALKPHOS 75 06/04/2020   BILITOT 0.5 06/04/2020   Lab Results  Component Value Date   CHOL 263 (H) 06/04/2020   HDL 75 06/04/2020   LDLCALC 168 (H) 06/04/2020   LDLDIRECT 224.0 08/26/2017   TRIG 117 06/04/2020   CHOLHDL 6 08/26/2017    2. Pre-diabetes Alejandro [name] is on Metformin 500 mg QD. His last A1c was 5.3.  Lab Results  Component Value Date   HGBA1C 5.3 06/04/2020   Lab Results  Component Value Date   INSULIN 14.2 06/04/2020   INSULIN 7.2 12/10/2019   INSULIN 32.3 (H) 07/31/2019   INSULIN 26.6 (H) 04/09/2019   Alejandro [name] is at a higher than average risk for cardiovascular disease due to obesity.  Assessment/Plan:   1. Mixed hyperlipidemia Cardiovascular risk  and specific lipid/LDL goals reviewed.  We discussed several lifestyle modifications today and Alejandro [name] will continue to work on diet, exercise and weight loss efforts. Orders and follow up as documented in patient record. Increase Lipitor to 40 mg from 20 mg.  Counseling Intensive lifestyle modifications are the first line treatment for this issue. . Dietary changes: Increase soluble fiber. Decrease simple carbohydrates. . Exercise changes: Moderate to vigorous-intensity aerobic activity 150 minutes per week if tolerated. . Lipid-lowering medications: see documented in medical record.  - atorvastatin (LIPITOR) 40 MG tablet; Take 1 tablet (40 mg total) by mouth daily.  Dispense: 90 tablet; Refill: 3  2. Pre-diabetes Alejandro [name] will continue to work on weight loss, exercise, and decreasing simple carbohydrates to help decrease the risk of diabetes. Continue Metformin.  Alejandro [name] was given approximately 15 minutes of coronary artery disease prevention counseling today. He is 40 y.o. male and has risk factors for heart disease including obesity. We discussed intensive lifestyle modifications today with an emphasis on specific weight loss instructions and strategies.   Repetitive spaced learning was employed today to elicit superior memory formation and behavioral change. 3. Class 1 obesity due to excess calories with serious comorbidity and body mass index (BMI) of 32.0 to 32.9 in adult Alejandro [name] is currently in the action stage of change. As such, his goal is to  continue with weight loss efforts. He has agreed to following a lower carbohydrate, vegetable and lean protein rich diet plan.   Check coverage of Wegovy with new insurance next month.  Exercise goals: As is  Behavioral modification strategies: decreasing simple carbohydrates and travel eating strategies.  Alejandro [name] has agreed to follow-up with our clinic in 3 weeks.  Objective:   Blood pressure 116/72, pulse 85, temperature 98.1 F (36.7  C), height 5\' 8"  (1.727 m), weight 211 lb (95.7 kg), SpO2 98 %. Body mass index is 32.08 kg/m.  General: Cooperative, alert, well developed, in no acute distress. HEENT: Conjunctivae and lids unremarkable. Cardiovascular: Regular rhythm.  Lungs: Normal work of breathing. Neurologic: No focal deficits.   Lab Results  Component Value Date   CREATININE 0.97 06/04/2020   BUN 14 06/04/2020   NA 140 06/04/2020   K 4.3 06/04/2020   CL 100 06/04/2020   CO2 21 06/04/2020   Lab Results  Component Value Date   ALT 26 06/04/2020   AST 19 06/04/2020   ALKPHOS 75 06/04/2020   BILITOT 0.5 06/04/2020   Lab Results  Component Value Date   HGBA1C 5.3 06/04/2020   HGBA1C 5.2 12/10/2019   HGBA1C 5.7 (H) 07/31/2019   HGBA1C 5.7 (H) 04/09/2019   Lab Results  Component Value Date   INSULIN 14.2 06/04/2020   INSULIN 7.2 12/10/2019   INSULIN 32.3 (H) 07/31/2019   INSULIN 26.6 (H) 04/09/2019   Lab Results  Component Value Date   TSH 1.830 04/09/2019   Lab Results  Component Value Date   CHOL 263 (H) 06/04/2020   HDL 75 06/04/2020   LDLCALC 168 (H) 06/04/2020   LDLDIRECT 224.0 08/26/2017   TRIG 117 06/04/2020   CHOLHDL 6 08/26/2017   No results found for: WBC, HGB, HCT, MCV, PLT No results found for: IRON, TIBC, FERRITIN  Attestation Statements:   Reviewed by clinician on day of visit: allergies, medications, problem list, medical history, surgical history, family history, social history, and previous encounter notes.  Coral Ceo, am acting as Location manager for [employer], [place].  I have reviewed the above documentation for accuracy and completeness, and I agree with the above. -  Georgianne Fick, FNP

## 2020-09-11 ENCOUNTER — Ambulatory Visit (INDEPENDENT_AMBULATORY_CARE_PROVIDER_SITE_OTHER): Payer: 59 | Admitting: Family Medicine

## 2020-11-11 ENCOUNTER — Encounter: Payer: Self-pay | Admitting: Family Medicine

## 2020-11-11 ENCOUNTER — Telehealth: Payer: Self-pay

## 2020-11-11 ENCOUNTER — Telehealth (INDEPENDENT_AMBULATORY_CARE_PROVIDER_SITE_OTHER): Payer: 59 | Admitting: Family Medicine

## 2020-11-11 VITALS — Wt 220.0 lb

## 2020-11-11 DIAGNOSIS — K760 Fatty (change of) liver, not elsewhere classified: Secondary | ICD-10-CM

## 2020-11-11 DIAGNOSIS — U071 COVID-19: Secondary | ICD-10-CM

## 2020-11-11 DIAGNOSIS — E6609 Other obesity due to excess calories: Secondary | ICD-10-CM

## 2020-11-11 DIAGNOSIS — Z6838 Body mass index (BMI) 38.0-38.9, adult: Secondary | ICD-10-CM

## 2020-11-11 DIAGNOSIS — I10 Essential (primary) hypertension: Secondary | ICD-10-CM

## 2020-11-11 MED ORDER — MOLNUPIRAVIR EUA 200MG CAPSULE
4.0000 | ORAL_CAPSULE | Freq: Two times a day (BID) | ORAL | 0 refills | Status: AC
Start: 2020-11-11 — End: 2020-11-16

## 2020-11-11 NOTE — Telephone Encounter (Signed)
[facility] Night - Client TELEPHONE ADVICE RECORD [facility] Patient Name: Alejandro [name] Gender: Male DOB: 19-Sep-1980 Age: 40 Y 54 M 10 D Return Phone Number: [phone] (Primary) Address: City/ State/ Zip: [place]  [address] Client [facility] Night - Client Client Site [place] Physician Renford Dills - MD Contact Type Call Who Is Calling Patient / Member / Family / Caregiver Call Type Triage / Clinical Relationship To Patient Self Return Phone Number [phone] (Primary) Chief Complaint Fatigue (>THREE MONTHS) Reason for Call Symptomatic / Request for Health Information Initial Comment 1/2 caller states that they just tested positive for covid on at home test. He isn't sure if he needs medication and can get wife preventative medication. He is experiencing fatigue, weakness, cough, and losing some taste/smell. She is nonsymptomatic Translation No Nurse Assessment Nurse: Ferdinand Lango, RN, Kenney Houseman Date/Time (Eastern Time): 11/09/2020 12:09:10 PM Confirm and document reason for call. If symptomatic, describe symptoms. ---Caller states that yesterday he started to feel sick. Today he tested positive for COVID on at home test. He is experiencing fatigue, weakness, cough, head and chest congestion and losing some taste/smell. States he may have a low grade fever but no thermometer to confirm and denies any other issues at this time. Does the patient have any new or worsening symptoms? ---Yes Will a triage be completed? ---Yes Related visit to physician within the last 2 weeks? ---No Does the PT have any chronic conditions? (i.e. diabetes, asthma, this includes High risk factors for pregnancy, etc.) ---No Is this a behavioral health or substance abuse call? ---No Guidelines Guideline Title Affirmed Question Affirmed Notes Nurse Date/Time ([place] Time) COVID-19 - Diagnosed  or Suspected [1] COVID-19 diagnosed by positive lab test (e.g., PCR, rapid self-test kit) AND [2] mild symptoms Evalee Mutton 11/09/2020 12:11:44 PM PLEASE NOTE: All timestamps contained within this report are represented as [place] Standard Time. CONFIDENTIALTY NOTICE: This fax transmission is intended only for the addressee. It contains information that is legally privileged, confidential or otherwise protected from use or disclosure. If you are not the intended recipient, you are strictly prohibited from reviewing, disclosing, copying using or disseminating any of this information or taking any action in reliance on or regarding this information. If you have received this fax in error, please notify us immediately by telephone so that we can arrange for its return to [place]. Phone: [phone], Toll-Free: [phone], Fax: [phone] Page: 2 of 2 Call Id: [id number] Guidelines Guideline Title Affirmed Question Affirmed Notes Nurse Date/Time Eilene Ghazi Time) (e.g., cough, fever, others) AND [2] no complications or SOB Disp. Time Eilene Ghazi Time) Disposition Final User 11/09/2020 12:18:40 PM Home Care Yes Ferdinand Lango, RN, Kenney Houseman Caller Disagree/Comply Comply Caller Understands Yes PreDisposition Did not know what to do Care Advice Given Per Guideline HOME CARE: * You should be able to treat this at home. REASSURANCE AND EDUCATION - POSITIVE COVID-19 LAB TEST AND MILD SYMPTOMS: * A positive result on a PCR or rapid self-test kit is highly accurate for diagnosing COVID-19. It is highly likely that you have COVID-19. * From what you have told me, your symptoms are mild. That is reassuring. GENERAL CARE ADVICE FOR COVID-19 SYMPTOMS: * Cough: Use cough drops. * Feeling dehydrated: Drink extra liquids. If the air in your home is dry, use a humidifier. * Fever: For fever over 101 F (38.3 C), take acetaminophen every 4 to 6 hours (Adults 650 mg) OR ibuprofen every 6 to 8  hours (Adults 400 mg).  Before taking any medicine, read all the instructions on the package. Do not take aspirin unless your doctor has prescribed it for you. * Sore throat: Try throat lozenges, hard candy or warm chicken broth. * HOME REMEDY - HONEY: This old home remedy has been shown to help decrease coughing at night. The adult dosage is 2 teaspoons (10 ml) at bedtime. COUGH MEDICINES: * COUGH SYRUP WITH DEXTROMETHORPHAN: An over-the-counter cough syrup can help your cough. The most common cough suppressant in over-the-counter cough medicines is dextromethorphan. COVID-19 - HOW TO PROTECT OTHERS - WHEN YOU ARE SICK WITH COVID-19: * STAY HOME A MINIMUM OF 5 DAYS: Home isolation is needed for at least 5 days after the symptoms started. Stay home from school or work if you are sick. Do NOT go to religious services, child care centers, shopping, or other public places. Do NOT use public transportation (e.g., bus, taxis, ride-sharing). Do NOT allow any visitors to your home. Leave the house only if you need to seek urgent medical care. * WEAR A MASK FOR 10 DAYS: Wear a well-fitted mask for 10 full days any time you are around others inside your home or in public. Do not go to places where you are unable to wear a mask. * [place] HANDS OFTEN: Wash hands often with soap and water. After coughing or sneezing are important times. If soap and water are not available, use an alcohol-based hand sanitizer with at least 60% alcohol, covering all surfaces of your hands and rubbing them together until they feel dry. Avoid touching your eyes, nose, and mouth with unwashed hands. * CALL AHEAD IF MEDICAL CARE IS NEEDED: If you have a medical appointment, call your doctor's office and tell them you have or may have COVID-19. This will help the office protect themselves and other patients. They will give you directions. CALL BACK IF: * Fever over 103 F (39.4 C) * Fever lasts over 3 days * Fever returns after being gone for 24 hours * Chest  pain or difficulty breathing occurs * You become worse CARE ADVICE given per COVID-19 - DIAGNOSED OR SUSPECTED (Adult) guideline.

## 2020-11-11 NOTE — Telephone Encounter (Signed)
See note from today

## 2020-11-11 NOTE — Progress Notes (Signed)
I connected with Duke Salvia on 11/11/20 at 12:20 PM EDT by video and verified that I am speaking with the correct person using two identifiers.   I discussed the limitations, risks, security and privacy concerns of performing an evaluation and management service by video and the availability of in person appointments. I also discussed with the patient that there may be a patient responsible charge related to this service. The patient expressed understanding and agreed to proceed.  Patient location: Home Provider Location: [place] Participants: Lesleigh Noe and Duke Salvia   Subjective:     Alejandro [name] is a 40 y.o. male presenting for Covid Positive (Onset of symptoms 11/08/20. Tested positive on5/29/22), Fever, Cough, Fatigue, and Generalized Body Aches     HPI   #Covid positive - started getting sick on 5/28 - slightly better - continues to have body aches and fatigue - still cough and could symptoms - Did receive vaccine x 2 and booster   Review of Systems  Constitutional: Negative for chills and fever.  HENT: Positive for congestion. Negative for sinus pressure and sinus pain.   Respiratory: Positive for cough. Negative for shortness of breath.   Cardiovascular: Negative for chest pain.  Gastrointestinal: Negative for diarrhea, nausea and vomiting.     Social History   Tobacco Use  Smoking Status Never Smoker  Smokeless Tobacco Never Used        Objective:   BP Readings from Last 3 Encounters:  08/21/20 116/72  06/04/20 128/75  05/14/20 119/72   Wt Readings from Last 3 Encounters:  11/11/20 220 lb (99.8 kg)  08/21/20 211 lb (95.7 kg)  06/04/20 200 lb (90.7 kg)    Wt 220 lb (99.8 kg)   BMI 33.45 kg/m   Physical Exam Constitutional:      Appearance: Normal appearance. He is not ill-appearing.  HENT:     Head: Normocephalic and atraumatic.     Right Ear: External ear normal.     Left Ear: External ear normal.  Eyes:      Conjunctiva/sclera: Conjunctivae normal.  Pulmonary:     Effort: Pulmonary effort is normal. No respiratory distress.  Neurological:     Mental Status: He is alert. Mental status is at baseline.  Psychiatric:        Mood and Affect: Mood normal.        Behavior: Behavior normal.        Thought Content: Thought content normal.        Judgment: Judgment normal.          Assessment & Plan:   Problem List Items Addressed This Visit      Cardiovascular and Mediastinum   Essential hypertension     Digestive   Fatty liver     Other   Class 2 obesity due to excess calories with body mass index (BMI) of 38.0 to 38.9 in adult    Other Visit Diagnoses    COVID-19 virus infection    -  Primary   Relevant Medications   molnupiravir EUA 200 mg CAPS     Covid-19 infection Discussed that he is high risk for severe illness (HTN and obesity) and would benefit from treatment. He is interested in medication. Due to fatty liver and previously abnormal albumin on recent labs elected to do Molnupiravir for low risk of side effects and liver impact.   ER precautions Continue symptomatic care  Return if symptoms worsen or fail to improve.  Lesleigh Noe, MD

## 2020-11-12 NOTE — Telephone Encounter (Signed)
See video visit note.  Thanks.

## 2020-11-14 ENCOUNTER — Telehealth: Payer: 59 | Admitting: Family Medicine

## 2020-12-02 ENCOUNTER — Ambulatory Visit (INDEPENDENT_AMBULATORY_CARE_PROVIDER_SITE_OTHER): Payer: BC Managed Care – PPO | Admitting: Family Medicine

## 2020-12-02 ENCOUNTER — Encounter (INDEPENDENT_AMBULATORY_CARE_PROVIDER_SITE_OTHER): Payer: Self-pay | Admitting: Family Medicine

## 2020-12-02 ENCOUNTER — Encounter (INDEPENDENT_AMBULATORY_CARE_PROVIDER_SITE_OTHER): Payer: Self-pay

## 2020-12-02 ENCOUNTER — Other Ambulatory Visit: Payer: Self-pay

## 2020-12-02 VITALS — BP 129/83 | HR 82 | Temp 97.8°F | Ht 68.0 in | Wt 229.0 lb

## 2020-12-02 DIAGNOSIS — E6609 Other obesity due to excess calories: Secondary | ICD-10-CM | POA: Diagnosis not present

## 2020-12-02 DIAGNOSIS — F3289 Other specified depressive episodes: Secondary | ICD-10-CM | POA: Diagnosis not present

## 2020-12-02 DIAGNOSIS — R7303 Prediabetes: Secondary | ICD-10-CM | POA: Diagnosis not present

## 2020-12-02 DIAGNOSIS — Z9189 Other specified personal risk factors, not elsewhere classified: Secondary | ICD-10-CM | POA: Diagnosis not present

## 2020-12-02 DIAGNOSIS — Z6838 Body mass index (BMI) 38.0-38.9, adult: Secondary | ICD-10-CM

## 2020-12-02 MED ORDER — SAXENDA 18 MG/3ML ~~LOC~~ SOPN: 3.0000 mg | PEN_INJECTOR | Freq: Every day | SUBCUTANEOUS | 0 refills | Status: DC

## 2020-12-02 MED ORDER — BD PEN NEEDLE NANO 2ND GEN 32G X 4 MM MISC: 0 refills | Status: DC

## 2020-12-03 NOTE — Progress Notes (Signed)
Chief Complaint:   OBESITY Alejandro [name] is here to discuss his progress with his obesity treatment plan along with follow-up of his obesity related diagnoses. [name] is on following a lower carbohydrate, vegetable and lean protein rich diet plan and states he is following his eating plan approximately 0% of the time. [name] states he has started working out x2 days.  Today's visit was #: 25 Starting weight: 254 lbs Starting date: 04/09/2019 Today's weight: 229 lbs Today's date: 12/02/2020 Total lbs lost to date: 25 lbs Total lbs lost since last in-office visit: +18 lbs  Interim History: Alejandro [name]'s last office visit was on 08-21-2020.  He is up 18 pounds today.  He has been off plan quite a bit.  He started a new job recently, as did his wife, and he says their lives have been unsettled.  He skips breakfast and lunch at times.  Subjective:   1. Prediabetes Last A1c was 5.3, when he was on Wegovy.  Prediabetic before that.  On metformin daily.  Lab Results  Component Value Date   HGBA1C 5.3 06/04/2020   Lab Results  Component Value Date   INSULIN 14.2 06/04/2020   INSULIN 7.2 12/10/2019   INSULIN 32.3 (H) 07/31/2019   INSULIN 26.6 (H) 04/09/2019   2. Other depression with emotional eating He notes that he will go all day without eating and then go overboard at supper.  He says he has a significant issues with emotional eating.  3. At risk for side effect of medication Alejandro [name] is at increased for medication side effect due to starting Saxenda.  Assessment/Plan:   1. Prediabetes New prescription provided today for Saxenda 3 mg daily.  Continue metformin 500 mg at supper.  - Start Liraglutide -Weight Management (SAXENDA) 18 MG/3ML SOPN; Inject 3 mg into the skin daily.  Dispense: 15 mL; Refill: 0 - Insulin Pen Needle (BD PEN NEEDLE NANO 2ND GEN) 32G X 4 MM MISC; Use 1 needle daily to inject Saxenda.  Dispense: 100 each; Refill: 0  2. Other depression with emotional  eating Patient was referred to Dr. Mallie Mussel, our Bariatric Psychologist. He has seen her in the past. - Ambulatory referral to Psychology  3. At risk for side effect of medication Alejandro [name] was given approximately 15 minutes of drug side effect counseling today.  We discussed side effect possibility and risk versus benefits. [name] agreed to the medication and will contact this office if these side effects are intolerable.  Repetitive spaced learning was employed today to elicit superior memory formation and behavioral change.   4. Obesity: Current BMI 34.83  - Start Liraglutide -Weight Management (SAXENDA) 18 MG/3ML SOPN; Inject 3 mg into the skin daily.  Dispense: 15 mL; Refill: 0 - Insulin Pen Needle (BD PEN NEEDLE NANO 2ND GEN) 32G X 4 MM MISC; Use 1 needle daily to inject Saxenda.  Dispense: 100 each; Refill: 0  Alejandro [name] is currently in the action stage of change. As such, his goal is to continue with weight loss efforts. He has agreed to following a lower carbohydrate, vegetable and lean protein rich diet plan.   Exercise goals:  As is.  Behavioral modification strategies: decreasing simple carbohydrates and no skipping meals.  [name] has agreed to follow-up with our clinic in 2 weeks.  Objective:   Blood pressure 129/83, pulse 82, temperature 97.8 F (36.6 C), height 5\' 8"  (1.727 m), weight 229 lb (103.9 kg), SpO2 97 %. Body mass index is 34.82 kg/m.  General: Cooperative, alert, well  developed, in no acute distress. HEENT: Conjunctivae and lids unremarkable. Cardiovascular: Regular rhythm.  Lungs: Normal work of breathing. Neurologic: No focal deficits.   Lab Results  Component Value Date   CREATININE 0.97 06/04/2020   BUN 14 06/04/2020   NA 140 06/04/2020   K 4.3 06/04/2020   CL 100 06/04/2020   CO2 21 06/04/2020   Lab Results  Component Value Date   ALT 26 06/04/2020   AST 19 06/04/2020   ALKPHOS 75 06/04/2020   BILITOT 0.5 06/04/2020   Lab Results  Component  Value Date   HGBA1C 5.3 06/04/2020   HGBA1C 5.2 12/10/2019   HGBA1C 5.7 (H) 07/31/2019   HGBA1C 5.7 (H) 04/09/2019   Lab Results  Component Value Date   INSULIN 14.2 06/04/2020   INSULIN 7.2 12/10/2019   INSULIN 32.3 (H) 07/31/2019   INSULIN 26.6 (H) 04/09/2019   Lab Results  Component Value Date   TSH 1.830 04/09/2019   Lab Results  Component Value Date   CHOL 263 (H) 06/04/2020   HDL 75 06/04/2020   LDLCALC 168 (H) 06/04/2020   LDLDIRECT 224.0 08/26/2017   TRIG 117 06/04/2020   CHOLHDL 6 08/26/2017   Attestation Statements:   Reviewed by clinician on day of visit: allergies, medications, problem list, medical history, surgical history, family history, social history, and previous encounter notes.  I, Water quality scientist, CMA, am acting as Location manager for [employer], [place].  I have reviewed the above documentation for accuracy and completeness, and I agree with the above. -  Georgianne Fick, FNP

## 2020-12-04 DIAGNOSIS — M545 Low back pain, unspecified: Secondary | ICD-10-CM | POA: Diagnosis not present

## 2020-12-07 ENCOUNTER — Encounter (INDEPENDENT_AMBULATORY_CARE_PROVIDER_SITE_OTHER): Payer: Self-pay | Admitting: Family Medicine

## 2020-12-08 ENCOUNTER — Other Ambulatory Visit (INDEPENDENT_AMBULATORY_CARE_PROVIDER_SITE_OTHER): Payer: Self-pay | Admitting: Family Medicine

## 2020-12-08 DIAGNOSIS — E7849 Other hyperlipidemia: Secondary | ICD-10-CM

## 2020-12-08 NOTE — Telephone Encounter (Signed)
Pt last seen by Dawn Whitmire, FNP.  

## 2020-12-08 NOTE — Progress Notes (Signed)
Office: [phone]  /  Fax: [phone]    Date: December 22, 2020   Appointment Start Time: 9:01am Duration: 50 minutes Provider: Glennie Isle, Psy.D. Type of Session: Intake for Individual Therapy  Location of Patient: Work Public librarian) Location of Provider: Provider's home (private office) Type of Contact: Telepsychological Visit via MyChart Video Visit  Informed Consent: Prior to proceeding with today's appointment, two pieces of identifying information were obtained. In addition, Jibran's physical location at the time of this appointment was obtained as well a phone number he could be reached at in the event of technical difficulties. [name] and this provider participated in today's telepsychological service.   The provider's role was explained to Duke Salvia. The provider reviewed and discussed issues of confidentiality, privacy, and limits therein (e.g., reporting obligations). In addition to verbal informed consent, written informed consent for psychological services was obtained prior to the initial appointment. Since the clinic is not a 24/7 crisis center, mental health emergency resources were shared and this  provider explained MyChart, e-mail, voicemail, and/or other messaging systems should be utilized only for non-emergency reasons. This provider also explained that information obtained during appointments will be placed in [facility] medical record and relevant information will be shared with other providers at [facility] for coordination of care. Akshay agreed information may be shared with other [facility] providers as needed for coordination of care and by signing the service agreement document, he provided written consent for coordination of care. Prior to initiating telepsychological services, Iosefa completed an informed consent document, which included the development of a safety plan (i.e., an emergency contact and emergency resources)  in the event of an emergency/crisis. Savio verbally acknowledged understanding he is ultimately responsible for understanding his insurance benefits for telepsychological and in-person services. This provider also reviewed confidentiality, as it relates to telepsychological services, as well as the rationale for telepsychological services (i.e., to reduce exposure risk to COVID-19). Bacilio  acknowledged understanding that appointments cannot be recorded without both party consent and he is aware he is responsible for securing confidentiality on his end of the session. Gerod verbally consented to proceed.  Chief Complaint/HPI: Duey was referred by [facility], FNP-C due to other depression, with emotional eating. Per the note for the visit with Jake Bathe, FNP-C on December 02, 2020, "He notes that he will go all day without eating and then go overboard at supper.  He says he has a significant issues with emotional eating." Monnie previously met with this provider to address emotional eating behaviors from 04/11/2019-08/27/2019.   During today's appointment, Johnathin stated he has lost 70 pounds, adding he gained back approximately 30 pounds. Since his last appointment with this provider, Nathanyel indicated he has "read so many books," but feels it has "not helped." He acknowledged he continues to engage in emotional eating behaviors when there is a "deviation" from a "normal day." Theophile requested a "refresher" of previously learned skills with this provider. He recalled discussing mindfulness, adding, "I haven't been doing it really well. Moreover, he indicated engaging in binge eating behaviors (e.g., eating a container of popcorn) since the last appointment with this provider, adding, "I think I have improved a lot." Since the last appointment with this provider, Giovanie denied restricting food intake and engaging in purging behaviors. Furthermore, Raydell explained it feels like it is a "constant  thought" about food. Through examples shared, it was reflected Tymier does not appear to be eating regularly. Yoshio further shared he  continues to engage in physical activity regularly, adding he is frequently in back pain. He was encouraged to discuss further with his PCP; he agreed.   Mental Status Examination:  Appearance: well groomed and appropriate hygiene  Behavior: appropriate to circumstances Mood: euthymic Affect: mood congruent Speech: normal in rate, volume, and tone Eye Contact: appropriate Psychomotor Activity: unable to assess Gait: unable to assess  Thought Process: linear, logical, and goal directed  Thought Content/Perception: denies suicidal and homicidal ideation, plan, and intent, no hallucinations, delusions, bizarre thinking or behavior reported or observed, and denies ideation and engagement in self-injurious behaviors Orientation: time, person, place, and purpose of appointment Memory/Concentration: memory, attention, language, and fund of knowledge intact  Insight/Judgment: fair  Family & Psychosocial History: Chue reported he is married and he has two sons (ages 81 and 26). 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 Additionally, Trevino shared his highest level of education obtained is a master's degree. Currently, Keegan's social support system consists of his wife, children, and parents. Moreover, Zayvon stated he resides with his wife and children.   Medical History:  Past Medical History:  Diagnosis Date   ADD (attention deficit disorder)    Alcohol abuse    Anxiety    Back pain    Bursitis of both knees    Degenerative disc disease, lumbar    Depression    Drug use    Fatty liver    Floppy eyelid syndrome    GERD (gastroesophageal reflux disease)    Hyperlipidemia    Hypertension    Obesity    Plantar fasciitis    followed by ortho   Sleep apnea    SOB (shortness of breath)    Past Surgical History:  Procedure  Laterality Date   US ECHOCARDIOGRAPHY  08/2010   normal, no LVH   Current Outpatient Medications on File Prior to Visit  Medication Sig Dispense Refill   amphetamine-dextroamphetamine (ADDERALL XR) 20 MG 24 hr capsule Take 20 mg by mouth daily.     amphetamine-dextroamphetamine (ADDERALL) 20 MG tablet Take 10 mg by mouth daily as needed.     atorvastatin (LIPITOR) 20 MG tablet Take 1 tablet (20 mg total) by mouth daily. 90 tablet 0   escitalopram (LEXAPRO) 20 MG tablet Take 1 tablet (20 mg total) by mouth daily. 30 tablet 0   Insulin Pen Needle (BD PEN NEEDLE NANO 2ND GEN) 32G X 4 MM MISC Use 1 needle daily to inject Saxenda. 100 each 0   Liraglutide -Weight Management (SAXENDA) 18 MG/3ML SOPN Inject 3 mg into the skin daily. 15 mL 0   metFORMIN (GLUCOPHAGE) 500 MG tablet Take 1 tablet (500 mg total) by mouth daily. 30 tablet 0   Vitamin D, Ergocalciferol, (DRISDOL) 1.25 MG (50000 UNIT) CAPS capsule Take 1 capsule (50,000 Units total) by mouth every 7 (seven) days. 12 capsule 0   No current facility-administered medications on file prior to visit.   Mental Health History: Malvin reported he is no longer attending therapeutic services with [facility] ([place]), noting his last appointment was 1-2 years ago. Currently, he indicated he takes Lexapro, adding, "It's good." Quientin reported he continues to meet with Beryle Lathe (NP) with [facility] for medication management; however, expressed desire to initiate services with a new psychiatric provider. Ashwin reported there is no history of hospitalizations for psychiatric concerns. Boyd denied a family history of mental health/substance abuse related concerns. Since the last appointment  with this provider, Julus reported there is no history of trauma including psychological, physical , and sexual abuse, as well as neglect.   Jiro described his typical mood lately as "good,"  adding there is occasional work stress. Valdis denied alcohol use since the last appointment with this provider. He indicated he is not attending [place] meetings, adding he feels it is not a good fit and it is a "commitment" he does not have time for. He denied tobacco use.  He denied illicit/recreational substance use. Regarding caffeine intake, Creg reported he consumes approximately 16 oz of coffee daily. Furthermore, Kenyan indicated he is not experiencing the following: hallucinations and delusions, paranoia, symptoms of mania , social withdrawal, crying spells, and panic attacks. He also denied history of and current suicidal ideation, plan, and intent; history of and current homicidal ideation, plan, and intent; and history of and current engagement in self-harm.  The following strengths were reported by [name]: funny, optimistic, self-aware, caring, ability to simplify things, and "individualization." The following strengths were observed by this provider: ability to express thoughts and feelings during the therapeutic session, ability to establish and benefit from a therapeutic relationship, willingness to work toward established goal(s) with the clinic and ability to engage in reciprocal conversation.   Legal History: Lealand reported a history of a DUI approximately 25 years ago.   Structured Assessments Results: The Patient Health Questionnaire-9 (PHQ-9) is a self-report measure that assesses symptoms and severity of depression over the course of the last two weeks. Abad obtained a score of 3 suggesting minimal depression. Taheem finds the endorsed symptoms to be somewhat difficult. [0= Not at all; 1= Several days; 2= More than half the days; 3= Nearly every day] Little interest or pleasure in doing things 0  Feeling down, depressed, or hopeless 0  Trouble falling or staying asleep, or sleeping too much 0  Feeling tired or having little energy 0  Poor appetite or overeating 1  Feeling  bad about yourself --- or that you are a failure or have let yourself or your family down 2  Trouble concentrating on things, such as reading the newspaper or watching television 0  Moving or speaking so slowly that other people could have noticed? Or the opposite --- being so fidgety or restless that you have been moving around a lot more than usual 0  Thoughts that you would be better off dead or hurting yourself in some way 0  PHQ-9 Score 3    The Generalized Anxiety Disorder-7 (GAD-7) is a brief self-report measure that assesses symptoms of anxiety over the course of the last two weeks. Mikaele obtained a score of 9 suggesting mild anxiety. Craven finds the endorsed symptoms to be somewhat difficult. [0= Not at all; 1= Several days; 2= Over half the days; 3= Nearly every day] Feeling nervous, anxious, on edge 1  Not being able to stop or control worrying 3  Worrying too much about different things (E.g., family's well-being) 3  Trouble relaxing 2  Being so restless that it's hard to sit still 0  Becoming easily annoyed or irritable 0  Feeling afraid as if something awful might happen 0  GAD-7 Score 9   Interventions:  Conducted a chart review Verbally administered PHQ-9 and GAD-7 for symptom monitoring Provided emphatic reflections and validation Collaborated with patient on a treatment goal  Psychoeducation provided regarding physical versus emotional hunger Recommended/discussed option for longer-term therapeutic services Employed insight oriented and cognitive psychotherapy interventions to identify and modify anxiety/mood  producing thoughts, beliefs, and negative self-appraisals contributing to distress and emotional eating Engaged patient in problem solving  Provisional DSM-5 Diagnosis(es): F50.89 Other Specified Feeding or Eating Disorder, Emotional and Binge Eating Behaviors and F41.9 Unspecified Anxiety Disorder  Plan: Delmont appears able and willing to participate as  evidenced by collaboration on a treatment goal, engagement in reciprocal conversation, and asking questions as needed for clarification. The next appointment will be scheduled in approximately two weeks, which will be via MyChart Video Visit. The following treatment goal was established: increase coping skills. This provider will regularly review the treatment plan and medical chart to keep informed of status changes. Doyne expressed understanding and agreement with the initial treatment plan of care.  Jobin will be sent a handout via e-mail to utilize between now and the next appointment to awareness of hunger patterns and subsequent eating. Santonio provided verbal consent during today's appointment for this provider to send the handout via e-mail. To assist with eating regularly, he was encouraged to set alarms on his phone as reminders. Additionally, this provider recommended that Ulisses focus on working out congruent to the recommendations made by [employer], FNP-C and his PCP to avoid/reduce physical pain. He agreed. Moreover, Summer expressed desire to initiate longer term therapeutic services again and initiate services with a new psychiatric provider; therefore, he provided verbal consent for this provider to e-mail a list of referral options.

## 2020-12-17 ENCOUNTER — Other Ambulatory Visit: Payer: Self-pay

## 2020-12-17 ENCOUNTER — Ambulatory Visit (INDEPENDENT_AMBULATORY_CARE_PROVIDER_SITE_OTHER): Payer: BC Managed Care – PPO | Admitting: Family Medicine

## 2020-12-17 ENCOUNTER — Encounter (INDEPENDENT_AMBULATORY_CARE_PROVIDER_SITE_OTHER): Payer: Self-pay | Admitting: Family Medicine

## 2020-12-17 VITALS — BP 112/73 | HR 81 | Temp 98.1°F | Ht 68.0 in | Wt 224.0 lb

## 2020-12-17 DIAGNOSIS — E6609 Other obesity due to excess calories: Secondary | ICD-10-CM

## 2020-12-17 DIAGNOSIS — E7849 Other hyperlipidemia: Secondary | ICD-10-CM | POA: Diagnosis not present

## 2020-12-17 DIAGNOSIS — E559 Vitamin D deficiency, unspecified: Secondary | ICD-10-CM

## 2020-12-17 DIAGNOSIS — Z6838 Body mass index (BMI) 38.0-38.9, adult: Secondary | ICD-10-CM

## 2020-12-17 MED ORDER — VITAMIN D (ERGOCALCIFEROL) 1.25 MG (50000 UNIT) PO CAPS: 50000.0000 [IU] | ORAL_CAPSULE | ORAL | 0 refills | Status: DC

## 2020-12-17 MED ORDER — ATORVASTATIN CALCIUM 20 MG PO TABS: 20.0000 mg | ORAL_TABLET | Freq: Every day | ORAL | 0 refills | Status: DC

## 2020-12-22 ENCOUNTER — Telehealth (INDEPENDENT_AMBULATORY_CARE_PROVIDER_SITE_OTHER): Payer: BC Managed Care – PPO | Admitting: Psychology

## 2020-12-22 DIAGNOSIS — F5089 Other specified eating disorder: Secondary | ICD-10-CM | POA: Diagnosis not present

## 2020-12-22 DIAGNOSIS — F419 Anxiety disorder, unspecified: Secondary | ICD-10-CM | POA: Diagnosis not present

## 2020-12-23 ENCOUNTER — Encounter (INDEPENDENT_AMBULATORY_CARE_PROVIDER_SITE_OTHER): Payer: Self-pay | Admitting: Family Medicine

## 2020-12-23 NOTE — Progress Notes (Signed)
Chief Complaint:   OBESITY Raiford is here to discuss his progress with his obesity treatment plan along with follow-up of his obesity related diagnoses. Rainn is on following a lower carbohydrate, vegetable and lean protein rich diet plan and states he is following his eating plan approximately 90% of the time. Ohm states he is not exercising regularly.  Today's visit was #: 80 Starting weight: 254 lbs Starting date: 04/09/2019 Today's weight: 224 lbs Today's date: 12/17/2020 Total lbs lost to date: 25 lbs Total lbs lost since last in-office visit: 5 lbs  Interim History: Yossef did well getting back on the low carb plan other than eating some carbs over the holiday.  He did go overboard with sweets this past weekend.  He is skipping some meals.  He is drinking adequate water and has increased his vegetable intake.  Subjective:   1. Vitamin D deficiency Vitamin D low at 24.8.  Not on supplementation.  Lab Results  Component Value Date   VD25OH 24.8 (L) 06/04/2020   VD25OH 58.2 12/10/2019   VD25OH 21.8 (L) 07/31/2019   2. Other hyperlipidemia LDL is not at goal (168).  HDL and TG at goal.  On atorvastatin 20 mg daily.  No myalgias.   Lab Results  Component Value Date   ALT 26 06/04/2020   AST 19 06/04/2020   ALKPHOS 75 06/04/2020   BILITOT 0.5 06/04/2020   Lab Results  Component Value Date   CHOL 263 (H) 06/04/2020   HDL 75 06/04/2020   LDLCALC 168 (H) 06/04/2020   LDLDIRECT 224.0 08/26/2017   TRIG 117 06/04/2020   CHOLHDL 6 08/26/2017   Assessment/Plan:   1. Vitamin D deficiency New prescription for vitamin D 50,000 IU weekly #12 with no refills.  - Start Vitamin D, Ergocalciferol, (DRISDOL) 1.25 MG (50000 UNIT) CAPS capsule; Take 1 capsule (50,000 Units total) by mouth every 7 (seven) days.  Dispense: 12 capsule; Refill: 0  2. Other hyperlipidemia Refill atorvastatin 20 mg #90 with no refills.  - Refill atorvastatin (LIPITOR) 20 MG tablet; Take 1  tablet (20 mg total) by mouth daily.  Dispense: 90 tablet; Refill: 0  3. Obesity: Current BMI 34.07  Halton is currently in the action stage of change. As such, his goal is to continue with weight loss efforts. He has agreed to following a lower carbohydrate, vegetable and lean protein rich diet plan.   He will see Dr. Mallie Mussel soon.  He says that he has a big emotional component to his eating.  Exercise goals: All adults should avoid inactivity. Some physical activity is better than none, and adults who participate in any amount of physical activity gain some health benefits.  Behavioral modification strategies: increasing lean protein intake, decreasing simple carbohydrates, and no skipping meals.  Hyde has agreed to follow-up with our clinic in 2 weeks.  Objective:   Blood pressure 112/73, pulse 81, temperature 98.1 F (36.7 C), height 5\' 8"  (1.727 m), weight 224 lb (101.6 kg), SpO2 96 %. Body mass index is 34.06 kg/m.  General: Cooperative, alert, well developed, in no acute distress. HEENT: Conjunctivae and lids unremarkable. Cardiovascular: Regular rhythm.  Lungs: Normal work of breathing. Neurologic: No focal deficits.   Lab Results  Component Value Date   CREATININE 0.97 06/04/2020   BUN 14 06/04/2020   NA 140 06/04/2020   K 4.3 06/04/2020   CL 100 06/04/2020   CO2 21 06/04/2020   Lab Results  Component Value Date   ALT 26  06/04/2020   AST 19 06/04/2020   ALKPHOS 75 06/04/2020   BILITOT 0.5 06/04/2020   Lab Results  Component Value Date   HGBA1C 5.3 06/04/2020   HGBA1C 5.2 12/10/2019   HGBA1C 5.7 (H) 07/31/2019   HGBA1C 5.7 (H) 04/09/2019   Lab Results  Component Value Date   INSULIN 14.2 06/04/2020   INSULIN 7.2 12/10/2019   INSULIN 32.3 (H) 07/31/2019   INSULIN 26.6 (H) 04/09/2019   Lab Results  Component Value Date   TSH 1.830 04/09/2019   Lab Results  Component Value Date   CHOL 263 (H) 06/04/2020   HDL 75 06/04/2020   LDLCALC 168 (H)  06/04/2020   LDLDIRECT 224.0 08/26/2017   TRIG 117 06/04/2020   CHOLHDL 6 08/26/2017   Lab Results  Component Value Date   VD25OH 24.8 (L) 06/04/2020   VD25OH 58.2 12/10/2019   VD25OH 21.8 (L) 07/31/2019   Attestation Statements:   Reviewed by clinician on day of visit: allergies, medications, problem list, medical history, surgical history, family history, social history, and previous encounter notes.  I, Water quality scientist, CMA, am acting as Location manager for [employer], [place].  I have reviewed the above documentation for accuracy and completeness, and I agree with the above. -  Georgianne Fick, FNP

## 2020-12-23 NOTE — Progress Notes (Signed)
  Office: [phone]  /  Fax: [phone]    Date: January 06, 2021   Appointment Start Time: 4:01pm Duration: 31 minutes Provider: Glennie Isle, Psy.D. 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 male presenting for a follow-up appointment to address the previously established treatment goal of increasing coping skills. Today's appointment was a telepsychological visit due to COVID-19. Alejandro [name] provided verbal consent for today's telepsychological appointment and he is aware he is responsible for securing confidentiality on his end of the session. Prior to proceeding with today's appointment, Alejandro [name]'s physical location at the time of this appointment was obtained as well a phone number he could be reached at in the event of technical difficulties. Alejandro [name] and this provider participated in today's telepsychological service.   This provider conducted a brief check-in. Alejandro [name] sated, "I think I've very seriously injured my back." He stated he received a cortisone shot and will be going for an MRI. The aforementioned has impacted his eating habits, noting he is eating out of convenience and will skip meals. Alejandro [name] was engaged in problem solving to determine convenient protein options (e.g., deli roll ups with cheese, hardboiled eggs). This provider and Alejandro [name] also discussed the impact of not eating regularly. He was receptive to setting alarms on his phone for reminders to eat regularly. Remainder of session focused on unhelpful thinking patterns creating "setbacks." Psychoeducation regarding common cognitive distortions associated with emotional eating was provided. Alejandro [name] provided verbal consent during today's appointment for this provider to send a handout about cognitive distortions via e-mail. Overall,   Alejandro [name] was receptive to today's appointment as evidenced by openness to sharing, responsiveness to feedback, and willingness to discuss unhelpful thinking patterns.  Mental Status Examination:  Appearance: well groomed and appropriate hygiene  Behavior: appropriate to circumstances Mood: euthymic Affect: mood congruent Speech: normal in rate, volume, and tone Eye Contact: appropriate Psychomotor Activity: unable to assess Gait: unable to assess Thought Process: linear, logical, and goal directed  Thought Content/Perception: no hallucinations, delusions, bizarre thinking or behavior reported or observed and no evidence or endorsement of suicidal and homicidal ideation, plan, and intent Orientation: time, person, place, and purpose of appointment Memory/Concentration: memory, attention, language, and fund of knowledge intact  Insight/Judgment: good  Interventions:  Conducted a brief chart review Provided empathic reflections and validation Processed thoughts and feelings Psychoeducation provided regarding cognitive distortions  Employed supportive psychotherapy interventions to facilitate reduced distress, and to improve coping skills with identified stressors  DSM-5 Diagnosis(es):  F50.89 Other Specified Feeding or Eating Disorder, Emotional and Binge Eating Behaviors and F41.9 Unspecified Anxiety Disorder  Treatment Goal & Progress: During the initial appointment with this provider, the following treatment goal was established: increase coping skills. [place] has demonstrated progress in his goal as evidenced by willingness to identify unhelpful thinking patterns.   Plan: The next appointment will be scheduled in 2-3 weeks, which will be via MyChart Video Visit. The next session will focus on working towards the established treatment goal.

## 2020-12-29 ENCOUNTER — Other Ambulatory Visit: Payer: Self-pay

## 2020-12-29 ENCOUNTER — Encounter (INDEPENDENT_AMBULATORY_CARE_PROVIDER_SITE_OTHER): Payer: Self-pay | Admitting: Family Medicine

## 2020-12-29 ENCOUNTER — Ambulatory Visit (INDEPENDENT_AMBULATORY_CARE_PROVIDER_SITE_OTHER): Payer: BC Managed Care – PPO | Admitting: Family Medicine

## 2020-12-29 VITALS — BP 107/66 | HR 87 | Temp 97.7°F | Ht 68.0 in | Wt 225.0 lb

## 2020-12-29 DIAGNOSIS — E7849 Other hyperlipidemia: Secondary | ICD-10-CM | POA: Diagnosis not present

## 2020-12-29 DIAGNOSIS — R7303 Prediabetes: Secondary | ICD-10-CM

## 2020-12-29 DIAGNOSIS — F3289 Other specified depressive episodes: Secondary | ICD-10-CM | POA: Diagnosis not present

## 2020-12-29 DIAGNOSIS — E6609 Other obesity due to excess calories: Secondary | ICD-10-CM

## 2020-12-29 DIAGNOSIS — Z6838 Body mass index (BMI) 38.0-38.9, adult: Secondary | ICD-10-CM | POA: Diagnosis not present

## 2020-12-29 MED ORDER — ATORVASTATIN CALCIUM 20 MG PO TABS: 20.0000 mg | ORAL_TABLET | Freq: Every day | ORAL | 0 refills | Status: DC

## 2021-01-01 NOTE — Progress Notes (Signed)
Chief Complaint:   OBESITY Alejandro [name] is here to discuss his progress with his obesity treatment plan along with follow-up of his obesity related diagnoses. Alejandro [name] is on following a lower carbohydrate, vegetable and lean protein rich diet plan and states he is following his eating plan approximately 60-70% of the time. Alejandro [name] states he is walking, yoga, and HIIT 45 minutes 6 times per week.  Today's visit was #: 65 Starting weight: 254 lbs Starting date: 04/09/2019 Today's weight: 225 lbs Today's date: 12/29/2020 Total lbs lost to date: 24 Total lbs lost since last in-office visit: +1  Interim History: Alejandro [name] has not started [place] yet. He does not have his new insurance card yet so we haven't been able to get prior authorization. He has had social functions that has gotten him off track. He has NOOM for free through work and has started it. He is open to journaling vs low carb plan. He is meal planning more.  Subjective:   1. Other hyperlipidemia Alejandro [name] has been off atorvastatin because he ran out. His prescription was sent to the pharmacy but they did not receive it. LDL elevated at 168.  Lab Results  Component Value Date   ALT 26 06/04/2020   AST 19 06/04/2020   ALKPHOS 75 06/04/2020   BILITOT 0.5 06/04/2020   Lab Results  Component Value Date   CHOL 263 (H) 06/04/2020   HDL 75 06/04/2020   LDLCALC 168 (H) 06/04/2020   LDLDIRECT 224.0 08/26/2017   TRIG 117 06/04/2020   CHOLHDL 6 08/26/2017   2. Other depression with emotional eating Alejandro [name] has met with Dr. Mallie Mussel. He admits to some emotional eating. He feels life is always throwing curve balls and he needs to readjust plan. He notes some night eating and self sabotage.  Assessment/Plan:   1. Other hyperlipidemia  We discussed several lifestyle modifications today and Alejandro [name] will continue to work on diet, exercise and weight loss efforts.   Refill- atorvastatin (LIPITOR) 20 MG tablet; Take 1 tablet (20 mg  total) by mouth daily.  Dispense: 90 tablet; Refill: 0  2. Other depression with emotional eating Behavior modification techniques were discussed today to help Alejandro [name] deal with his emotional/non-hunger eating behaviors.   Continue to see Dr. Mallie Mussel.  3. Obesity: Current BMI 34.22  Alejandro [name] is currently in the action stage of change. As such, his goal is to continue with weight loss efforts. He has agreed to keeping a food journal and adhering to recommended goals of 1800-2000 calories and 120 g protein.   Exercise goals:  As is  Behavioral modification strategies: decreasing simple carbohydrates and better snacking choices.  Carl has agreed to follow-up with our clinic in 2-3 weeks.  Objective:   Blood pressure 107/66, pulse 87, temperature 97.7 F (36.5 C), height 5' 8"  (1.727 m), weight 225 lb (102.1 kg), SpO2 96 %. Body mass index is 34.21 kg/m.  General: Cooperative, alert, well developed, in no acute distress. HEENT: Conjunctivae and lids unremarkable. Cardiovascular: Regular rhythm.  Lungs: Normal work of breathing. Neurologic: No focal deficits.   Lab Results  Component Value Date   CREATININE 0.97 06/04/2020   BUN 14 06/04/2020   NA 140 06/04/2020   K 4.3 06/04/2020   CL 100 06/04/2020   CO2 21 06/04/2020   Lab Results  Component Value Date   ALT 26 06/04/2020   AST 19 06/04/2020   ALKPHOS 75 06/04/2020   BILITOT 0.5 06/04/2020   Lab Results  Component Value Date  HGBA1C 5.3 06/04/2020   HGBA1C 5.2 12/10/2019   HGBA1C 5.7 (H) 07/31/2019   HGBA1C 5.7 (H) 04/09/2019   Lab Results  Component Value Date   INSULIN 14.2 06/04/2020   INSULIN 7.2 12/10/2019   INSULIN 32.3 (H) 07/31/2019   INSULIN 26.6 (H) 04/09/2019   Lab Results  Component Value Date   TSH 1.830 04/09/2019   Lab Results  Component Value Date   CHOL 263 (H) 06/04/2020   HDL 75 06/04/2020   LDLCALC 168 (H) 06/04/2020   LDLDIRECT 224.0 08/26/2017   TRIG 117 06/04/2020   CHOLHDL 6  08/26/2017   Lab Results  Component Value Date   VD25OH 24.8 (L) 06/04/2020   VD25OH 58.2 12/10/2019   VD25OH 21.8 (L) 07/31/2019   No results found for: WBC, HGB, HCT, MCV, PLT No results found for: IRON, TIBC, FERRITIN  Attestation Statements:   Reviewed by clinician on day of visit: allergies, medications, problem list, medical history, surgical history, family history, social history, and previous encounter notes.  Coral Ceo, CMA, am acting as Location manager for [employer], [place].  I have reviewed the above documentation for accuracy and completeness, and I agree with the above. -  Georgianne Fick, FNP

## 2021-01-05 DIAGNOSIS — M545 Low back pain, unspecified: Secondary | ICD-10-CM | POA: Diagnosis not present

## 2021-01-06 ENCOUNTER — Telehealth (INDEPENDENT_AMBULATORY_CARE_PROVIDER_SITE_OTHER): Payer: BC Managed Care – PPO | Admitting: Psychology

## 2021-01-06 DIAGNOSIS — F5089 Other specified eating disorder: Secondary | ICD-10-CM | POA: Diagnosis not present

## 2021-01-06 DIAGNOSIS — F419 Anxiety disorder, unspecified: Secondary | ICD-10-CM | POA: Diagnosis not present

## 2021-01-09 DIAGNOSIS — M545 Low back pain, unspecified: Secondary | ICD-10-CM | POA: Diagnosis not present

## 2021-01-12 ENCOUNTER — Encounter (INDEPENDENT_AMBULATORY_CARE_PROVIDER_SITE_OTHER): Payer: Self-pay | Admitting: Family Medicine

## 2021-01-12 ENCOUNTER — Ambulatory Visit (INDEPENDENT_AMBULATORY_CARE_PROVIDER_SITE_OTHER): Payer: BC Managed Care – PPO | Admitting: Family Medicine

## 2021-01-12 ENCOUNTER — Other Ambulatory Visit: Payer: Self-pay

## 2021-01-12 VITALS — BP 113/77 | HR 77 | Temp 97.9°F | Ht 68.0 in | Wt 228.0 lb

## 2021-01-12 DIAGNOSIS — Z6838 Body mass index (BMI) 38.0-38.9, adult: Secondary | ICD-10-CM

## 2021-01-12 DIAGNOSIS — E6609 Other obesity due to excess calories: Secondary | ICD-10-CM

## 2021-01-12 DIAGNOSIS — E559 Vitamin D deficiency, unspecified: Secondary | ICD-10-CM | POA: Diagnosis not present

## 2021-01-12 DIAGNOSIS — E8881 Metabolic syndrome: Secondary | ICD-10-CM | POA: Diagnosis not present

## 2021-01-12 DIAGNOSIS — Z9189 Other specified personal risk factors, not elsewhere classified: Secondary | ICD-10-CM

## 2021-01-12 MED ORDER — VITAMIN D (ERGOCALCIFEROL) 1.25 MG (50000 UNIT) PO CAPS: 50000.0000 [IU] | ORAL_CAPSULE | ORAL | 0 refills | Status: DC

## 2021-01-12 MED ORDER — TIRZEPATIDE 2.5 MG/0.5ML ~~LOC~~ SOAJ: 2.5000 mg | SUBCUTANEOUS | 0 refills | Status: DC

## 2021-01-13 NOTE — Progress Notes (Signed)
Chief Complaint:   OBESITY Alejandro [name] is here to discuss his progress with his obesity treatment plan along with follow-up of his obesity related diagnoses. Alejandro [name] is on keeping a food journal and adhering to recommended goals of 1800-2000 calories and 120 grams protein and states he is following his eating plan approximately 10-20% of the time. Alejandro [name] states he is not currently exercising due to back pain.  Today's visit was #: 28 Starting weight: 254 lbs Starting date: 04/09/2019 Today's weight: 228 lbs Today's date: 01/12/2021 Total lbs lost to date: 26 Total lbs lost since last in-office visit: +3  Interim History: Alejandro [name] started journaling but injured his back last week and has been totally off plan. He is in quite a bit of pain. He did like the journaling and felt it kept him from feeling "all or nothing" about the plan. He had previous always done the low carb plan with [place].  He tends to do aggressive exercises (kettle bell, free weights). He has injured himself several times. He sometimes does more than one exercise class per day.   Subjective:   1. Insulin resistance Alejandro [name]'s last A1c was 5.3. He has been in pre-diabetes range. He did very well in the past with weight loss on Wegovy.  Lab Results  Component Value Date   INSULIN 14.2 06/04/2020   INSULIN 7.2 12/10/2019   INSULIN 32.3 (H) 07/31/2019   INSULIN 26.6 (H) 04/09/2019   Lab Results  Component Value Date   HGBA1C 5.3 06/04/2020   2. Vitamin D deficiency Alejandro [name] Vit D level was low at 24.8. He is currently taking prescription vitamin D 50,000 IU each week.   Lab Results  Component Value Date   VD25OH 24.8 (L) 06/04/2020   VD25OH 58.2 12/10/2019   VD25OH 21.8 (L) 07/31/2019   3. At risk for hypoglycemia Alejandro [name] is at increased risk for hypoglycemia due to starting [facility]  Assessment/Plan:   1. Insulin resistance Alejandro [name] will start Mounjaro 2.5 mg weekly. He will watch for signs of hypoglycemia.  Alejandro [name] agreed to follow-up with [place] as directed to closely monitor his progress. Pt advised we are using Mounjaro off label for weight loss. Coupon provided.  New/Start- tirzepatide (MOUNJARO) 2.5 MG/0.5ML Pen; Inject 2.5 mg into the skin once a week.  Dispense: 2 mL; Refill: 0  2. Vitamin D deficiency  He agrees to continue to take prescription Vitamin D '@50'$ ,000 IU every week and will follow-up for routine testing of Vitamin D, at least 2-3 times per year to avoid over-replacement. Check Vit D at next OV.  Refill- Vitamin D, Ergocalciferol, (DRISDOL) 1.25 MG (50000 UNIT) CAPS capsule; Take 1 capsule (50,000 Units total) by mouth every 7 (seven) days.  Dispense: 12 capsule; Refill: 0  3. At risk for hypoglycemia Alejandro [name] was given approximately 15 minutes of counseling today regarding prevention of hypoglycemia. He was advised of symptoms of hypoglycemia. Alejandro [name] was instructed to avoid skipping meals, eat regular protein rich meals and schedule low calorie snacks as needed.   Repetitive spaced learning was employed today to elicit superior memory formation and behavioral change  4. Obesity: Current BMI 34.22  Alejandro [name] is currently in the action stage of change. As such, his goal is to continue with weight loss efforts. He has agreed to keeping a food journal and adhering to recommended goals of 1800-2000 calories and 120 grams protein.   Exercise goals: No exercise has been prescribed at this time. We discussed that he will need to revise  exercise routine after back improves to include exercises  that can be accomplished more safely with less risk of injury- weight machines, swimming, etc.   Behavioral modification strategies: decreasing simple carbohydrates and planning for success.  Alejandro [name] has agreed to follow-up with our clinic in 3 weeks.  Objective:   Blood pressure 113/77, pulse 77, temperature 97.9 F (36.6 C), height '5\' 8"'$  (1.727 m), weight 228 lb (103.4 kg), SpO2 97 %. Body mass  index is 34.67 kg/m.  General: Cooperative, alert, well developed, in no acute distress. HEENT: Conjunctivae and lids unremarkable. Cardiovascular: Regular rhythm.  Lungs: Normal work of breathing. Neurologic: No focal deficits.   Lab Results  Component Value Date   CREATININE 0.97 06/04/2020   BUN 14 06/04/2020   NA 140 06/04/2020   K 4.3 06/04/2020   CL 100 06/04/2020   CO2 21 06/04/2020   Lab Results  Component Value Date   ALT 26 06/04/2020   AST 19 06/04/2020   ALKPHOS 75 06/04/2020   BILITOT 0.5 06/04/2020   Lab Results  Component Value Date   HGBA1C 5.3 06/04/2020   HGBA1C 5.2 12/10/2019   HGBA1C 5.7 (H) 07/31/2019   HGBA1C 5.7 (H) 04/09/2019   Lab Results  Component Value Date   INSULIN 14.2 06/04/2020   INSULIN 7.2 12/10/2019   INSULIN 32.3 (H) 07/31/2019   INSULIN 26.6 (H) 04/09/2019   Lab Results  Component Value Date   TSH 1.830 04/09/2019   Lab Results  Component Value Date   CHOL 263 (H) 06/04/2020   HDL 75 06/04/2020   LDLCALC 168 (H) 06/04/2020   LDLDIRECT 224.0 08/26/2017   TRIG 117 06/04/2020   CHOLHDL 6 08/26/2017   Lab Results  Component Value Date   VD25OH 24.8 (L) 06/04/2020   VD25OH 58.2 12/10/2019   VD25OH 21.8 (L) 07/31/2019    Attestation Statements:   Reviewed by clinician on day of visit: allergies, medications, problem list, medical history, surgical history, family history, social history, and previous encounter notes.  Coral Ceo, CMA, am acting as Location manager for [employer], [place].  I have reviewed the above documentation for accuracy and completeness, and I agree with the above. -  Georgianne Fick, FNP

## 2021-01-14 ENCOUNTER — Encounter (INDEPENDENT_AMBULATORY_CARE_PROVIDER_SITE_OTHER): Payer: Self-pay | Admitting: Family Medicine

## 2021-01-19 NOTE — Progress Notes (Signed)
Office: [phone]  /  Fax: [phone]    Date: February 02, 2021   Appointment Start Time: 2:02pm Duration: 28 minutes Provider: Glennie Isle, Psy.D. 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 male presenting for a follow-up appointment to address the previously established treatment goal of increasing coping skills. Today's appointment was a telepsychological visit due to COVID-19. Alejandro [name] provided verbal consent for today's telepsychological appointment and he is aware he is responsible for securing confidentiality on his end of the session. Prior to proceeding with today's appointment, Alejandro [name]'s physical location at the time of this appointment was obtained as well a phone number he could be reached at in the event of technical difficulties. Alejandro [name] and this provider participated in today's telepsychological service.   This provider conducted a brief check-in. Cap reported he is receiving a spinal injection for pain relief tomorrow. He also shared about his recent vacation, including interactions with his mother surrounding food. Associated thoughts and feelings processed. He discussed making better choices and engaging in portion control while on vacation. Alejandro [name] discussed a reduction in emotional and binge eating behaviors. Positive reinforcement was provided.   Reviewed cognitive distortions. Psychoeducation regarding thought defusion, including its impact on emotional eating and overall well-being was provided. Alejandro [name] was led through a thought defusion exercise, and a handout with various exercises was provided. Alejandro [name] was encouraged to engage in the thought defusion exercises between now and the next appointment with this provider. Alejandro [name] agreed. For the thought defusion exercises  during today's appointment, Alejandro [name] utilized the following thought: "I am not good enough." His experience was processed after the exercises. Alejandro [name] provided verbal consent during today's appointment for this provider to send a handout with thought defusion exercises via e-mail. Overall, Alejandro [name] was receptive to today's appointment as evidenced by openness to sharing, responsiveness to feedback, and willingness to engage in thought defusion exercises to assist with coping.  Mental Status Examination:  Appearance: well groomed and appropriate hygiene  Behavior: appropriate to circumstances Mood: euthymic Affect: mood congruent Speech: normal in rate, volume, and tone Eye Contact: appropriate Psychomotor Activity: unable to assess Gait: unable to assess Thought Process: linear, logical, and goal directed  Thought Content/Perception: no hallucinations, delusions, bizarre thinking or behavior reported or observed and no evidence or endorsement of suicidal and homicidal ideation, plan, and intent Orientation: time, person, place, and purpose of appointment Memory/Concentration: memory, attention, language, and fund of knowledge intact  Insight/Judgment: fair  Interventions:  Conducted a brief chart review Provided empathic reflections and validation Reviewed content from the previous session Psychoeducation provided regarding thought defusion Engaged patient in a thought defusion exercise Discussed option for a referral for longer-term therapeutic services Employed supportive psychotherapy interventions to facilitate reduced distress, and to improve coping skills with identified stressors  DSM-5 Diagnosis(es):  F50.89 Other Specified Feeding or Eating Disorder, Emotional and Binge Eating Behaviors and F41.9 Unspecified Anxiety Disorder  Treatment Goal & Progress: During the initial appointment with this provider, the following treatment goal was established: increase coping skills. Alejandro [name]  has demonstrated progress in his goal as evidenced by increased awareness of hunger patterns, increased awareness of triggers for emotional eating behaviors, reduction in emotional eating behaviors , and reduction in binge eating behaviors. Alejandro [name] also continues to demonstrate willingness to engage in learned skill(s).  Plan: The next appointment will be scheduled in approximately  three weeks, which will be via MyChart Video Visit. The next session will focus on working towards the established treatment goal. Alejandro [name] indicated he has not found another therapist; therefore, he provided verbal consent for this provider to place a referral with [facility] to address ongoing stressors.

## 2021-01-20 DIAGNOSIS — M545 Low back pain, unspecified: Secondary | ICD-10-CM | POA: Diagnosis not present

## 2021-02-02 ENCOUNTER — Telehealth (INDEPENDENT_AMBULATORY_CARE_PROVIDER_SITE_OTHER): Payer: BC Managed Care – PPO | Admitting: Psychology

## 2021-02-02 DIAGNOSIS — F419 Anxiety disorder, unspecified: Secondary | ICD-10-CM | POA: Diagnosis not present

## 2021-02-02 DIAGNOSIS — F5089 Other specified eating disorder: Secondary | ICD-10-CM

## 2021-02-03 ENCOUNTER — Other Ambulatory Visit: Payer: Self-pay

## 2021-02-03 ENCOUNTER — Encounter (INDEPENDENT_AMBULATORY_CARE_PROVIDER_SITE_OTHER): Payer: Self-pay | Admitting: Family Medicine

## 2021-02-03 ENCOUNTER — Ambulatory Visit (INDEPENDENT_AMBULATORY_CARE_PROVIDER_SITE_OTHER): Payer: BC Managed Care – PPO | Admitting: Family Medicine

## 2021-02-03 ENCOUNTER — Telehealth (INDEPENDENT_AMBULATORY_CARE_PROVIDER_SITE_OTHER): Payer: Self-pay | Admitting: Psychology

## 2021-02-03 VITALS — BP 116/79 | HR 81 | Temp 97.7°F | Ht 68.0 in | Wt 226.0 lb

## 2021-02-03 DIAGNOSIS — Z9189 Other specified personal risk factors, not elsewhere classified: Secondary | ICD-10-CM | POA: Diagnosis not present

## 2021-02-03 DIAGNOSIS — Z6838 Body mass index (BMI) 38.0-38.9, adult: Secondary | ICD-10-CM

## 2021-02-03 DIAGNOSIS — E6609 Other obesity due to excess calories: Secondary | ICD-10-CM

## 2021-02-03 DIAGNOSIS — E7849 Other hyperlipidemia: Secondary | ICD-10-CM

## 2021-02-03 DIAGNOSIS — M5416 Radiculopathy, lumbar region: Secondary | ICD-10-CM | POA: Diagnosis not present

## 2021-02-03 DIAGNOSIS — E8881 Metabolic syndrome: Secondary | ICD-10-CM | POA: Diagnosis not present

## 2021-02-03 DIAGNOSIS — E559 Vitamin D deficiency, unspecified: Secondary | ICD-10-CM

## 2021-02-03 DIAGNOSIS — E88819 Insulin resistance, unspecified: Secondary | ICD-10-CM

## 2021-02-03 MED ORDER — TIRZEPATIDE 5 MG/0.5ML ~~LOC~~ SOAJ: 5.0000 mg | SUBCUTANEOUS | 0 refills | Status: DC

## 2021-02-03 NOTE — Telephone Encounter (Signed)
  Office: [phone]  /  Fax: [phone]  Date of Call: February 03, 2021  Time of Call: 10:15am Provider: Glennie Isle, PsyD  CONTENT: This provider called [name] regarding the referral placed with [facility]. A HIPAA compliant voicemail was left requesting a call back.   PLAN: This provider will wait for Jon to call back. No further follow-up planned by this provider.

## 2021-02-03 NOTE — Progress Notes (Signed)
The 10-year ASCVD risk score Mikey Bussing DC Brooke Bonito., et al., 2013) is: 1%   Values used to calculate the score:     Age: 40 years     Sex: Male     Is Non-Hispanic African American: No     Diabetic: No     Tobacco smoker: No     Systolic Blood Pressure: 99991111 mmHg     Is BP treated: No     HDL Cholesterol: 75 mg/dL     Total Cholesterol: 263 mg/dL

## 2021-02-03 NOTE — Progress Notes (Signed)
Chief Complaint:   OBESITY Alejandro [name] is here to discuss his progress with his obesity treatment plan along with follow-up of his obesity related diagnoses. Alejandro [name] is on keeping a food journal and adhering to recommended goals of 1800-2000 calories and 120 grams of protein and states he is following his eating plan approximately 0% of the time. Alejandro [name] states he is doing 0 minutes 0 times per week.  Today's visit was #: 54 Starting weight: 254 lbs Starting date: 04/09/2019 Today's weight: 226 lbs Today's date: 02/03/2021 Total lbs lost to date: 28 lbs Total lbs lost since last in-office visit: 2 lbs  Interim History: Alejandro [name] went on a trip recently with his mom who tends to push food on him. He notes decreased appetite with recent start of Mounjaro. He did indulge some But was able to moderate his food intake. He is not exercising due to back issues (herniated discs). He will be getting an epidural injection today.   Subjective:   1. Other hyperlipidemia Nitin's last LDL was elevated at 168. His Triglycerides and HDL were within normal limits. Adeeb is currently taking Lipitor 20 mg. He has a family history of CV disease.  Lab Results  Component Value Date   CHOL 263 (H) 06/04/2020   HDL 75 06/04/2020   LDLCALC 168 (H) 06/04/2020   LDLDIRECT 224.0 08/26/2017   TRIG 117 06/04/2020   CHOLHDL 6 08/26/2017   Lab Results  Component Value Date   ALT 26 06/04/2020   AST 19 06/04/2020   ALKPHOS 75 06/04/2020   BILITOT 0.5 06/04/2020   The 10-year ASCVD risk score Alejandro [name] DC Jr., et al., 2013) is: 1%   Values used to calculate the score:     Age: 40 years     Sex: Male     Is Non-Hispanic African American: No     Diabetic: No     Tobacco smoker: No     Systolic Blood Pressure: 99991111 mmHg     Is BP treated: No     HDL Cholesterol: 75 mg/dL     Total Cholesterol: 263 mg/dL   2. Vitamin D deficiency Alejandro [name]'s Vitamin D level was low at 24.8. He is currently on weekly  prescription Vitamin D.  Lab Results  Component Value Date   VD25OH 24.8 (L) 06/04/2020   VD25OH 58.2 12/10/2019   VD25OH 21.8 (L) 07/31/2019    3. Insulin resistance We started [name] on [place] last office visit (2.5 mg) and he already notes appetite suppression. He does noted mild constipation and nausea.  Lab Results  Component Value Date   INSULIN 14.2 06/04/2020   INSULIN 7.2 12/10/2019   INSULIN 32.3 (H) 07/31/2019   INSULIN 26.6 (H) 04/09/2019   Lab Results  Component Value Date   HGBA1C 5.3 06/04/2020    4. At risk for heart disease Garan is at risk for heart disease due to HLD, obesity and family history.  Assessment/Plan:   1. Other hyperlipidemia  We will check FLP today. Amine will continue Lipitor.Orders and follow up as documented in patient record.   - Lipid Panel With LDL/HDL Ratio   We will check Vitamin D today. We will refill Vitamin D today. Kenwood agrees to continue to take prescription Vitamin D 50,000 IU every week and will follow-up for routine testing of Vitamin D, at least 2-3 times per year to avoid over-replacement.  - VITAMIN D 25 Hydroxy (Vit-D Deficiency, Fractures)  3. Insulin resistance Alejandro [name] will continue to work on  weight loss, exercise, and decreasing simple carbohydrates to help decrease the risk of diabetes. We will check labs today. Alejandro [name] will increase dose of Mounjaro 5 mg weekly with no refills. Alejandro [name] agreed to follow-up with [place] as directed to closely monitor his progress.  - Comprehensive metabolic panel - Hemoglobin A1c - Insulin, random - tirzepatide (MOUNJARO) 5 MG/0.5ML Pen; Inject 5 mg into the skin once a week.  Dispense: 6 mL; Refill: 0  4. At risk for heart disease Alejandro [name] was given approximately 15 minutes of coronary artery disease prevention counseling today. He is 40 y.o. male and has risk factors for heart disease including obesity. We discussed intensive lifestyle modifications today with an emphasis  on specific weight loss instructions and strategies.   Repetitive spaced learning was employed today to elicit superior memory formation and behavioral change.   5. Obesity: Current BMI 34.37 Alejandro [name] is currently in the action stage of change. As such, his goal is to continue with weight loss efforts. He has agreed to keeping a food journal and adhering to recommended goals of 1800-2000 calories and 120 grams of  protein daily.  Exercise goals: No exercise has been prescribed at this time.  Behavioral modification strategies: decreasing simple carbohydrates and keeping a strict food journal.  Daschel has agreed to follow-up with our clinic in 3-4 weeks.  Alejandro [name] was informed we would discuss his lab results at his next visit unless there is a critical issue that needs to be addressed sooner. Alejandro [name] agreed to keep his next visit at the agreed upon time to discuss these results.  Objective:   Blood pressure 116/79, pulse 81, temperature 97.7 F (36.5 C), height '5\' 8"'$  (1.727 m), weight 226 lb (102.5 kg), SpO2 96 %. Body mass index is 34.36 kg/m.  General: Cooperative, alert, well developed, in no acute distress. HEENT: Conjunctivae and lids unremarkable. Cardiovascular: Regular rhythm.  Lungs: Normal work of breathing. Neurologic: No focal deficits.   Lab Results  Component Value Date   CREATININE 0.97 06/04/2020   BUN 14 06/04/2020   NA 140 06/04/2020   K 4.3 06/04/2020   CL 100 06/04/2020   CO2 21 06/04/2020   Lab Results  Component Value Date   ALT 26 06/04/2020   AST 19 06/04/2020   ALKPHOS 75 06/04/2020   BILITOT 0.5 06/04/2020   Lab Results  Component Value Date   HGBA1C 5.3 06/04/2020   HGBA1C 5.2 12/10/2019   HGBA1C 5.7 (H) 07/31/2019   HGBA1C 5.7 (H) 04/09/2019   Lab Results  Component Value Date   INSULIN 14.2 06/04/2020   INSULIN 7.2 12/10/2019   INSULIN 32.3 (H) 07/31/2019   INSULIN 26.6 (H) 04/09/2019   Lab Results  Component Value Date   TSH  1.830 04/09/2019   Lab Results  Component Value Date   CHOL 263 (H) 06/04/2020   HDL 75 06/04/2020   LDLCALC 168 (H) 06/04/2020   LDLDIRECT 224.0 08/26/2017   TRIG 117 06/04/2020   CHOLHDL 6 08/26/2017   Lab Results  Component Value Date   VD25OH 24.8 (L) 06/04/2020   VD25OH 58.2 12/10/2019   VD25OH 21.8 (L) 07/31/2019   No results found for: WBC, HGB, HCT, MCV, PLT No results found for: IRON, TIBC, FERRITIN  Attestation Statements:   Reviewed by clinician on day of visit: allergies, medications, problem list, medical history, surgical history, family history, social history, and previous encounter notes.  I, Lizbeth Bark, RMA, am acting as Location manager for [employer], [place].   I have  reviewed the above documentation for accuracy and completeness, and I agree with the above. -  Georgianne Fick, FNP

## 2021-02-04 ENCOUNTER — Encounter (INDEPENDENT_AMBULATORY_CARE_PROVIDER_SITE_OTHER): Payer: Self-pay

## 2021-02-04 LAB — COMPREHENSIVE METABOLIC PANEL
ALT: 27 IU/L (ref 0–44)
AST: 18 IU/L (ref 0–40)
Albumin/Globulin Ratio: 2.5 — ABNORMAL HIGH (ref 1.2–2.2)
Albumin: 4.9 g/dL (ref 4.0–5.0)
Alkaline Phosphatase: 67 IU/L (ref 44–121)
BUN/Creatinine Ratio: 20 (ref 9–20)
BUN: 18 mg/dL (ref 6–24)
Bilirubin Total: 0.4 mg/dL (ref 0.0–1.2)
CO2: 22 mmol/L (ref 20–29)
Calcium: 9.5 mg/dL (ref 8.7–10.2)
Chloride: 101 mmol/L (ref 96–106)
Creatinine, Ser: 0.92 mg/dL (ref 0.76–1.27)
Globulin, Total: 2 g/dL (ref 1.5–4.5)
Glucose: 95 mg/dL (ref 65–99)
Potassium: 4.5 mmol/L (ref 3.5–5.2)
Sodium: 141 mmol/L (ref 134–144)
Total Protein: 6.9 g/dL (ref 6.0–8.5)
eGFR: 107 mL/min/{1.73_m2} (ref >59)

## 2021-02-04 LAB — VITAMIN D 25 HYDROXY (VIT D DEFICIENCY, FRACTURES): Vit D, 25-Hydroxy: 33.5 ng/mL (ref 30.0–100.0)

## 2021-02-04 LAB — HEMOGLOBIN A1C
Est. average glucose Bld gHb Est-mCnc: 120 mg/dL
Hgb A1c MFr Bld: 5.8 % — ABNORMAL HIGH (ref 4.8–5.6)

## 2021-02-04 LAB — LIPID PANEL WITH LDL/HDL RATIO
Cholesterol, Total: 176 mg/dL (ref 100–199)
HDL: 86 mg/dL (ref >39)
LDL Chol Calc (NIH): 77 mg/dL (ref 0–99)
LDL/HDL Ratio: 0.9 ratio (ref 0.0–3.6)
Triglycerides: 66 mg/dL (ref 0–149)
VLDL Cholesterol Cal: 13 mg/dL (ref 5–40)

## 2021-02-04 LAB — INSULIN, RANDOM: INSULIN: 25.5 u[IU]/mL — ABNORMAL HIGH (ref 2.6–24.9)

## 2021-02-10 NOTE — Progress Notes (Unsigned)
  Office: [phone]  /  Fax: [phone]    Date: February 24, 2021   Appointment Start Time: *** Duration: *** minutes Provider: Glennie Isle, Psy.D. Type of Session: Individual Therapy  Location of Patient: {gbptloc:23249} Location of Provider: Provider's Home (private office) Type of Contact: Telepsychological Visit via MyChart Video Visit  Session Content: Terrial is a 40 y.o. male presenting for a follow-up appointment to address the previously established treatment goal of increasing coping skills.Today's appointment was a telepsychological visit due to COVID-19. [name] provided verbal consent for today's telepsychological appointment and he is aware he is responsible for securing confidentiality on his end of the session. Prior to proceeding with today's appointment, Alanzo's physical location at the time of this appointment was obtained as well a phone number he could be reached at in the event of technical difficulties. [name] and this provider participated in today's telepsychological service.   This provider conducted a brief check-in. *** [place] was receptive to today's appointment as evidenced by openness to sharing, responsiveness to feedback, and {gbreceptiveness:23401}.  Mental Status Examination:  Appearance: {Appearance:22431} Behavior: {Behavior:22445} Mood: {gbmood:21757} Affect: {Affect:22436} Speech: {Speech:22432} Eye Contact: {Eye Contact:22433} Psychomotor Activity: {Motor Activity:22434} Gait: {gbgait:23404} Thought Process: {thought process:22448}  Thought Content/Perception: {disturbances:22451} Orientation: {Orientation:22437} Memory/Concentration: {gbcognition:22449} Insight/Judgment: {Insight:22446}  Interventions:  {Interventions for Progress Notes:23405}  DSM-5 Diagnosis(es): F50.89 Other Specified Feeding or Eating Disorder, Emotional and Binge Eating Behaviors and F41.9 Unspecified Anxiety Disorder  Treatment Goal & Progress: During  the initial appointment with this provider, the following treatment goal was established: increase coping skills. Sujal has demonstrated progress in his goal as evidenced by {gbtxprogress:22839}. Nakul also {gbtxprogress2:22951}.  Plan: The next appointment will be scheduled in {gbweeks:21758}, which will be {gbtxmodality:23402}. The next session will focus on {Plan for Next Appointment:23400}.

## 2021-02-23 ENCOUNTER — Encounter (INDEPENDENT_AMBULATORY_CARE_PROVIDER_SITE_OTHER): Payer: Self-pay

## 2021-02-24 ENCOUNTER — Telehealth (INDEPENDENT_AMBULATORY_CARE_PROVIDER_SITE_OTHER): Payer: BC Managed Care – PPO | Admitting: Psychology

## 2021-02-24 ENCOUNTER — Ambulatory Visit (INDEPENDENT_AMBULATORY_CARE_PROVIDER_SITE_OTHER): Payer: BC Managed Care – PPO | Admitting: Family Medicine

## 2021-02-25 ENCOUNTER — Other Ambulatory Visit: Payer: Self-pay

## 2021-02-25 ENCOUNTER — Ambulatory Visit (INDEPENDENT_AMBULATORY_CARE_PROVIDER_SITE_OTHER): Payer: BC Managed Care – PPO | Admitting: Family Medicine

## 2021-02-25 ENCOUNTER — Other Ambulatory Visit (HOSPITAL_COMMUNITY): Payer: Self-pay

## 2021-02-25 ENCOUNTER — Encounter (INDEPENDENT_AMBULATORY_CARE_PROVIDER_SITE_OTHER): Payer: Self-pay | Admitting: Family Medicine

## 2021-02-25 VITALS — BP 128/79 | HR 76 | Temp 98.1°F | Ht 68.0 in | Wt 232.0 lb

## 2021-02-25 DIAGNOSIS — E6609 Other obesity due to excess calories: Secondary | ICD-10-CM | POA: Diagnosis not present

## 2021-02-25 DIAGNOSIS — E8881 Metabolic syndrome: Secondary | ICD-10-CM

## 2021-02-25 DIAGNOSIS — Z6838 Body mass index (BMI) 38.0-38.9, adult: Secondary | ICD-10-CM | POA: Diagnosis not present

## 2021-02-25 DIAGNOSIS — R7303 Prediabetes: Secondary | ICD-10-CM

## 2021-02-25 DIAGNOSIS — E559 Vitamin D deficiency, unspecified: Secondary | ICD-10-CM

## 2021-02-25 MED ORDER — TIRZEPATIDE 5 MG/0.5ML ~~LOC~~ SOAJ
5.0000 mg | SUBCUTANEOUS | 0 refills | Status: DC
  Filled 2021-02-25: qty 2, 28d supply, fill #0

## 2021-02-25 MED ORDER — VITAMIN D (ERGOCALCIFEROL) 1.25 MG (50000 UNIT) PO CAPS
50000.0000 [IU] | ORAL_CAPSULE | ORAL | 0 refills | Status: DC
  Filled 2021-02-25: qty 12, 84d supply, fill #0

## 2021-02-25 NOTE — Progress Notes (Signed)
Chief Complaint:   OBESITY Alejandro [name] is here to discuss his progress with his obesity treatment plan along with follow-up of his obesity related diagnoses. Alejandro [name] is on keeping a food journal and adhering to recommended goals of 1800-2000 calories and 80 grams of protein and following a lower carbohydrate, vegetable and lean protein rich diet plan and states he is following his eating plan approximately 20% of the time. Alejandro [name] states he is walking for 30 minutes 3-4 times per week.  Today's visit was #: 17 Starting weight: 254 lbs Starting date: 04/09/2019 Today's weight: 232 lbs Today's date: 02/25/2021 Total lbs lost to date: 22 lbs Total lbs lost since last in-office visit: 0  Interim History: Alejandro [name] has traveled recently and was off plan. He tends to be "all or nothing" with the plan. He feels the low carb plan works better for him than journaling.  Subjective:   1. Prediabetes Alejandro [name]'s A1C has increased to 5.8 from 5.3.  Lab Results  Component Value Date   INSULIN 25.5 (H) 02/03/2021   INSULIN 14.2 06/04/2020   INSULIN 7.2 12/10/2019   INSULIN 32.3 (H) 07/31/2019   INSULIN 26.6 (H) 04/09/2019   Lab Results  Component Value Date   HGBA1C 5.8 (H) 02/03/2021    2. Vitamin D deficiency Alejandro [name]'s Vitamin D is slowly increasing on weekly Vitamin D. It is currently 33.5.  Lab Results  Component Value Date   VD25OH 33.5 02/03/2021   VD25OH 24.8 (L) 06/04/2020   VD25OH 58.2 12/10/2019    Assessment/Plan:   1. Prediabetes  We will refill Mounjaro 5 mg weekly. Alejandro [name] agreed to follow-up with [place] as directed to closely monitor his progress.  - tirzepatide [facility]) 5 MG/0.5ML Pen; Inject 5 mg into the skin once a week.  Dispense: 6 mL; Refill: 0  2. Vitamin D deficiency  Alejandro [name] will continue prescription Vitamin D. We will refill prescription Vitamin D 50,000 IU every week for 1 month with no refills and Alejandro [name] will follow-up for routine testing of Vitamin D,  at least 2-3 times per year to avoid over-replacement.  - Vitamin D, Ergocalciferol, (DRISDOL) 1.25 MG (50000 UNIT) CAPS capsule; Take 1 capsule (50,000 Units total) by mouth every 7 (seven) days.  Dispense: 12 capsule; Refill: 0  3. Obesity: Current BMI 35.28 Alejandro [name] is currently in the action stage of change. As such, his goal is to continue with weight loss efforts. He has agreed to following a lower carbohydrate, vegetable and lean protein rich diet plan.   Exercise goals:  As is.  Behavioral modification strategies: increasing lean protein intake and decreasing simple carbohydrates.  Alejandro [name] has agreed to follow-up with our clinic in 3 weeks.  Objective:   Blood pressure 128/79, pulse 76, temperature 98.1 F (36.7 C), height '5\' 8"'$  (1.727 m), weight 232 lb (105.2 kg), SpO2 98 %. Body mass index is 35.28 kg/m.  General: Cooperative, alert, well developed, in no acute distress. HEENT: Conjunctivae and lids unremarkable. Cardiovascular: Regular rhythm.  Lungs: Normal work of breathing. Neurologic: No focal deficits.   Lab Results  Component Value Date   CREATININE 0.92 02/03/2021   BUN 18 02/03/2021   NA 141 02/03/2021   K 4.5 02/03/2021   CL 101 02/03/2021   CO2 22 02/03/2021   Lab Results  Component Value Date   ALT 27 02/03/2021   AST 18 02/03/2021   ALKPHOS 67 02/03/2021   BILITOT 0.4 02/03/2021   Lab Results  Component Value Date   HGBA1C 5.8 (  H) 02/03/2021   HGBA1C 5.3 06/04/2020   HGBA1C 5.2 12/10/2019   HGBA1C 5.7 (H) 07/31/2019   HGBA1C 5.7 (H) 04/09/2019   Lab Results  Component Value Date   INSULIN 25.5 (H) 02/03/2021   INSULIN 14.2 06/04/2020   INSULIN 7.2 12/10/2019   INSULIN 32.3 (H) 07/31/2019   INSULIN 26.6 (H) 04/09/2019   Lab Results  Component Value Date   TSH 1.830 04/09/2019   Lab Results  Component Value Date   CHOL 176 02/03/2021   HDL 86 02/03/2021   LDLCALC 77 02/03/2021   LDLDIRECT 224.0 08/26/2017   TRIG 66 02/03/2021    CHOLHDL 6 08/26/2017   Lab Results  Component Value Date   VD25OH 33.5 02/03/2021   VD25OH 24.8 (L) 06/04/2020   VD25OH 58.2 12/10/2019   No results found for: WBC, HGB, HCT, MCV, PLT No results found for: IRON, TIBC, FERRITIN  Attestation Statements:   Reviewed by clinician on day of visit: allergies, medications, problem list, medical history, surgical history, family history, social history, and previous encounter notes.   I, Lizbeth Bark, RMA, am acting as Location manager for [employer], [place].   I have reviewed the above documentation for accuracy and completeness, and I agree with the above. -  Georgianne Fick, FNP

## 2021-03-16 ENCOUNTER — Ambulatory Visit (INDEPENDENT_AMBULATORY_CARE_PROVIDER_SITE_OTHER): Payer: BC Managed Care – PPO | Admitting: Family Medicine

## 2021-03-16 ENCOUNTER — Encounter (INDEPENDENT_AMBULATORY_CARE_PROVIDER_SITE_OTHER): Payer: Self-pay | Admitting: Family Medicine

## 2021-03-16 ENCOUNTER — Other Ambulatory Visit: Payer: Self-pay

## 2021-03-16 ENCOUNTER — Other Ambulatory Visit (HOSPITAL_COMMUNITY): Payer: Self-pay

## 2021-03-16 VITALS — BP 117/80 | HR 81 | Temp 97.9°F | Ht 68.0 in | Wt 222.0 lb

## 2021-03-16 DIAGNOSIS — Z6838 Body mass index (BMI) 38.0-38.9, adult: Secondary | ICD-10-CM | POA: Diagnosis not present

## 2021-03-16 DIAGNOSIS — E7849 Other hyperlipidemia: Secondary | ICD-10-CM

## 2021-03-16 DIAGNOSIS — E6609 Other obesity due to excess calories: Secondary | ICD-10-CM

## 2021-03-16 DIAGNOSIS — R7303 Prediabetes: Secondary | ICD-10-CM

## 2021-03-16 MED ORDER — TIRZEPATIDE 7.5 MG/0.5ML ~~LOC~~ SOAJ
7.5000 mg | SUBCUTANEOUS | 0 refills | Status: DC
  Filled 2021-03-16: qty 2, 28d supply, fill #0

## 2021-03-16 MED ORDER — ATORVASTATIN CALCIUM 20 MG PO TABS
20.0000 mg | ORAL_TABLET | Freq: Every day | ORAL | 0 refills | Status: DC
  Filled 2021-03-16: qty 90, 90d supply, fill #0

## 2021-03-16 NOTE — Progress Notes (Addendum)
Chief Complaint:   OBESITY Alejandro [name] is here to discuss his progress with his obesity treatment plan along with follow-up of his obesity related diagnoses. [name] is on following a lower carbohydrate, vegetable and lean protein rich diet plan and states he is following his eating plan approximately 70% of the time. [name] states he is doing 0 minutes 0 times per week.  Today's visit was #: 31 Starting weight: 254 lbs Starting date: 04/09/2019 Today's weight: 222 lbs Today's date: 03/16/2021 Total lbs lost to date: 32 lbs Total lbs lost since last in-office visit: 10 lbs  Interim History: Alejandro [name] has not been working out but plans on start walking for exercise this week. He is eating low carb but not as strictly as he used to.   Subjective:   1. Prediabetes Alejandro [name] last A1C was 5.8. He is on Mounjaro 5 mg weekly. His appetite is well controlled. He denies nausea.   Lab Results  Component Value Date   HGBA1C 5.8 (H) 02/03/2021   Lab Results  Component Value Date   INSULIN 25.5 (H) 02/03/2021   INSULIN 14.2 06/04/2020   INSULIN 7.2 12/10/2019   INSULIN 32.3 (H) 07/31/2019   INSULIN 26.6 (H) 04/09/2019    2. Other hyperlipidemia Jeramey's LDL is much improved 77 down from 168. He is taking Atorvastatin daily. We discussed labs today.  Lab Results  Component Value Date   CHOL 176 02/03/2021   HDL 86 02/03/2021   LDLCALC 77 02/03/2021   LDLDIRECT 224.0 08/26/2017   TRIG 66 02/03/2021   CHOLHDL 6 08/26/2017   Lab Results  Component Value Date   ALT 27 02/03/2021   AST 18 02/03/2021   ALKPHOS 67 02/03/2021   BILITOT 0.4 02/03/2021   The 10-year ASCVD risk score (Arnett DK, et al., 2019) is: 0.4%   Values used to calculate the score:     Age: 61 years     Sex: Male     Is Non-Hispanic African American: No     Diabetic: No     Tobacco smoker: No     Systolic Blood Pressure: 308 mmHg     Is BP treated: No     HDL Cholesterol: 86 mg/dL     Total Cholesterol:  176 mg/dL   Assessment/Plan:   1. Prediabetes We will refill Mounjaro 7.5 mg weekly.  - tirzepatide (MOUNJARO) 7.5 MG/0.5ML Pen; Inject 7.5 mg into the skin once a week.  Dispense: 6 mL; Refill: 0  2. Other hyperlipidemia  Ayce will continue to work on diet, exercise and weight loss efforts. We will refill Atorvastatin 20 mg every day for 3 months with no refills.  - atorvastatin (LIPITOR) 20 MG tablet; Take 1 tablet (20 mg total) by mouth daily.  Dispense: 90 tablet; Refill: 0  3. Obesity: Current BMI 33.76 [name] is currently in the action stage of change. As such, his goal is to continue with weight loss efforts. He has agreed to following a lower carbohydrate, vegetable and lean protein rich diet plan.   Exercise goals: he will start walking for exercise.   Behavioral modification strategies: increasing lean protein intake and decreasing simple carbohydrates.  Alejandro [name] has agreed to follow-up with our clinic in 3-4 weeks.  Objective:   Blood pressure 117/80, pulse 81, temperature 97.9 F (36.6 C), height 5\' 8"  (1.727 m), weight 222 lb (100.7 kg), SpO2 98 %. Body mass index is 33.75 kg/m.  General: Cooperative, alert, well developed, in no acute distress. HEENT: Conjunctivae  and lids unremarkable. Cardiovascular: Regular rhythm.  Lungs: Normal work of breathing. Neurologic: No focal deficits.   Lab Results  Component Value Date   CREATININE 0.92 02/03/2021   BUN 18 02/03/2021   NA 141 02/03/2021   K 4.5 02/03/2021   CL 101 02/03/2021   CO2 22 02/03/2021   Lab Results  Component Value Date   ALT 27 02/03/2021   AST 18 02/03/2021   ALKPHOS 67 02/03/2021   BILITOT 0.4 02/03/2021   Lab Results  Component Value Date   HGBA1C 5.8 (H) 02/03/2021   HGBA1C 5.3 06/04/2020   HGBA1C 5.2 12/10/2019   HGBA1C 5.7 (H) 07/31/2019   HGBA1C 5.7 (H) 04/09/2019   Lab Results  Component Value Date   INSULIN 25.5 (H) 02/03/2021   INSULIN 14.2 06/04/2020   INSULIN 7.2  12/10/2019   INSULIN 32.3 (H) 07/31/2019   INSULIN 26.6 (H) 04/09/2019   Lab Results  Component Value Date   TSH 1.830 04/09/2019   Lab Results  Component Value Date   CHOL 176 02/03/2021   HDL 86 02/03/2021   LDLCALC 77 02/03/2021   LDLDIRECT 224.0 08/26/2017   TRIG 66 02/03/2021   CHOLHDL 6 08/26/2017   Lab Results  Component Value Date   VD25OH 33.5 02/03/2021   VD25OH 24.8 (L) 06/04/2020   VD25OH 58.2 12/10/2019   No results found for: WBC, HGB, HCT, MCV, PLT No results found for: IRON, TIBC, FERRITIN  Attestation Statements:   Reviewed by clinician on day of visit: allergies, medications, problem list, medical history, surgical history, family history, social history, and previous encounter notes.   I, Lizbeth Bark, RMA, am acting as Location manager for [employer], [place].   I have reviewed the above documentation for accuracy and completeness, and I agree with the above. -  Georgianne Fick, FNP

## 2021-03-17 ENCOUNTER — Other Ambulatory Visit (HOSPITAL_COMMUNITY): Payer: Self-pay

## 2021-03-18 ENCOUNTER — Other Ambulatory Visit (HOSPITAL_COMMUNITY): Payer: Self-pay

## 2021-03-19 ENCOUNTER — Other Ambulatory Visit (HOSPITAL_COMMUNITY): Payer: Self-pay

## 2021-04-08 ENCOUNTER — Other Ambulatory Visit (HOSPITAL_COMMUNITY): Payer: Self-pay

## 2021-04-08 ENCOUNTER — Encounter (INDEPENDENT_AMBULATORY_CARE_PROVIDER_SITE_OTHER): Payer: Self-pay | Admitting: Family Medicine

## 2021-04-08 ENCOUNTER — Other Ambulatory Visit: Payer: Self-pay

## 2021-04-08 ENCOUNTER — Ambulatory Visit (INDEPENDENT_AMBULATORY_CARE_PROVIDER_SITE_OTHER): Payer: BC Managed Care – PPO | Admitting: Family Medicine

## 2021-04-08 VITALS — BP 106/71 | HR 87 | Temp 97.7°F | Ht 68.0 in | Wt 214.0 lb

## 2021-04-08 DIAGNOSIS — E6609 Other obesity due to excess calories: Secondary | ICD-10-CM | POA: Diagnosis not present

## 2021-04-08 DIAGNOSIS — E7849 Other hyperlipidemia: Secondary | ICD-10-CM

## 2021-04-08 DIAGNOSIS — Z6838 Body mass index (BMI) 38.0-38.9, adult: Secondary | ICD-10-CM | POA: Diagnosis not present

## 2021-04-08 DIAGNOSIS — R7303 Prediabetes: Secondary | ICD-10-CM | POA: Diagnosis not present

## 2021-04-08 MED ORDER — TIRZEPATIDE 7.5 MG/0.5ML ~~LOC~~ SOAJ
7.5000 mg | SUBCUTANEOUS | 0 refills | Status: DC
Start: 2021-04-08 — End: 2021-04-29
  Filled 2021-04-08: qty 6, 84d supply, fill #0
  Filled 2021-04-14: qty 2, 28d supply, fill #0

## 2021-04-08 NOTE — Progress Notes (Signed)
Chief Complaint:   OBESITY Alejandro [name] is here to discuss his progress with his obesity treatment plan along with follow-up of his obesity related diagnoses. Alejandro [name] is on following a lower carbohydrate, vegetable and lean protein rich diet plan and states he is following his eating plan approximately 70% of the time. Alejandro [name] states he is not exercising regularly at this time.  Today's visit was #: 50 Starting weight: 254 lbs Starting date: 04/09/2019 Today's weight: 214 lbs Today's date: 04/08/2021 Total lbs lost to date: 40 lbs Total lbs lost since last in-office visit: 8 lbs  Interim History: Alejandro [name] is doing quite well with his eating and is following the low carb plan well.  He is not exercising currently but plans to start.  Appetite is well controlled on Mounjaro.  He feels his appetite is "normal" for the first time.  His weight goal is <200 pounds.  Subjective:   1. Prediabetes Alejandro [name] feels the Alejandro [name] is helping tremendously with his appetite.  Denies side effects.  Lab Results  Component Value Date   HGBA1C 5.8 (H) 02/03/2021   Lab Results  Component Value Date   INSULIN 25.5 (H) 02/03/2021   INSULIN 14.2 06/04/2020   INSULIN 7.2 12/10/2019   INSULIN 32.3 (H) 07/31/2019   INSULIN 26.6 (H) 04/09/2019   2. Other hyperlipidemia LDL now at goal.  HDL and triglycerides are within normal limits.  On Lipitor 20 mg.  Lab Results  Component Value Date   ALT 27 02/03/2021   AST 18 02/03/2021   ALKPHOS 67 02/03/2021   BILITOT 0.4 02/03/2021   Lab Results  Component Value Date   CHOL 176 02/03/2021   HDL 86 02/03/2021   LDLCALC 77 02/03/2021   LDLDIRECT 224.0 08/26/2017   TRIG 66 02/03/2021   CHOLHDL 6 08/26/2017   Assessment/Plan:   1. Prediabetes Refill Mounjaro 7.5 mg weekly, as per below.  - Refill tirzepatide (MOUNJARO) 7.5 MG/0.5ML Pen; Inject 7.5 mg into the skin once a week.  Dispense: 6 mL; Refill: 0  2. Other hyperlipidemia Continue Lipitor 20  mg daily.  3. Obesity: Current BMI 32.55  Alejandro [name] is currently in the action stage of change. As such, his goal is to continue with weight loss efforts. He has agreed to following a lower carbohydrate, vegetable and lean protein rich diet plan.   Exercise goals:  Encouraged him to get back to yoga and walking.  Behavioral modification strategies: planning for success.  Alejandro [name] has agreed to follow-up with our clinic in 3-4 weeks.  Objective:   Blood pressure 106/71, pulse 87, temperature 97.7 F (36.5 C), height 5\' 8"  (1.727 m), weight 214 lb (97.1 kg), SpO2 100 %. Body mass index is 32.54 kg/m.  General: Cooperative, alert, well developed, in no acute distress. HEENT: Conjunctivae and lids unremarkable. Cardiovascular: Regular rhythm.  Lungs: Normal work of breathing. Neurologic: No focal deficits.   Lab Results  Component Value Date   CREATININE 0.92 02/03/2021   BUN 18 02/03/2021   NA 141 02/03/2021   K 4.5 02/03/2021   CL 101 02/03/2021   CO2 22 02/03/2021   Lab Results  Component Value Date   ALT 27 02/03/2021   AST 18 02/03/2021   ALKPHOS 67 02/03/2021   BILITOT 0.4 02/03/2021   Lab Results  Component Value Date   HGBA1C 5.8 (H) 02/03/2021   HGBA1C 5.3 06/04/2020   HGBA1C 5.2 12/10/2019   HGBA1C 5.7 (H) 07/31/2019   HGBA1C 5.7 (H) 04/09/2019   Lab  Results  Component Value Date   INSULIN 25.5 (H) 02/03/2021   INSULIN 14.2 06/04/2020   INSULIN 7.2 12/10/2019   INSULIN 32.3 (H) 07/31/2019   INSULIN 26.6 (H) 04/09/2019   Lab Results  Component Value Date   TSH 1.830 04/09/2019   Lab Results  Component Value Date   CHOL 176 02/03/2021   HDL 86 02/03/2021   LDLCALC 77 02/03/2021   LDLDIRECT 224.0 08/26/2017   TRIG 66 02/03/2021   CHOLHDL 6 08/26/2017   Lab Results  Component Value Date   VD25OH 33.5 02/03/2021   VD25OH 24.8 (L) 06/04/2020   VD25OH 58.2 12/10/2019   Attestation Statements:   Reviewed by clinician on day of visit: allergies,  medications, problem list, medical history, surgical history, family history, social history, and previous encounter notes.  I, Alejandro [name], CMA, am acting as Location manager for Alejandro [name], [name].  I have reviewed the above documentation for accuracy and completeness, and I agree with the above. -  Alejandro Fick, FNP

## 2021-04-14 ENCOUNTER — Other Ambulatory Visit (HOSPITAL_COMMUNITY): Payer: Self-pay

## 2021-04-15 ENCOUNTER — Other Ambulatory Visit (HOSPITAL_COMMUNITY): Payer: Self-pay

## 2021-04-27 ENCOUNTER — Other Ambulatory Visit (INDEPENDENT_AMBULATORY_CARE_PROVIDER_SITE_OTHER): Payer: Self-pay | Admitting: Family Medicine

## 2021-04-27 DIAGNOSIS — E559 Vitamin D deficiency, unspecified: Secondary | ICD-10-CM

## 2021-04-27 NOTE — Telephone Encounter (Signed)
LOV with Dawn.

## 2021-04-29 ENCOUNTER — Encounter (INDEPENDENT_AMBULATORY_CARE_PROVIDER_SITE_OTHER): Payer: Self-pay | Admitting: Family Medicine

## 2021-04-29 ENCOUNTER — Ambulatory Visit (INDEPENDENT_AMBULATORY_CARE_PROVIDER_SITE_OTHER): Payer: BC Managed Care – PPO | Admitting: Family Medicine

## 2021-04-29 ENCOUNTER — Other Ambulatory Visit: Payer: Self-pay

## 2021-04-29 ENCOUNTER — Other Ambulatory Visit (HOSPITAL_COMMUNITY): Payer: Self-pay

## 2021-04-29 VITALS — BP 123/76 | HR 79 | Temp 98.0°F | Ht 68.0 in | Wt 217.0 lb

## 2021-04-29 DIAGNOSIS — E6609 Other obesity due to excess calories: Secondary | ICD-10-CM | POA: Diagnosis not present

## 2021-04-29 DIAGNOSIS — E7849 Other hyperlipidemia: Secondary | ICD-10-CM | POA: Diagnosis not present

## 2021-04-29 DIAGNOSIS — Z6838 Body mass index (BMI) 38.0-38.9, adult: Secondary | ICD-10-CM | POA: Diagnosis not present

## 2021-04-29 DIAGNOSIS — R7303 Prediabetes: Secondary | ICD-10-CM

## 2021-04-29 MED ORDER — TIRZEPATIDE 10 MG/0.5ML ~~LOC~~ SOAJ
10.0000 mg | SUBCUTANEOUS | 0 refills | Status: DC
  Filled 2021-04-29 – 2021-05-11 (×2): qty 2, 28d supply, fill #0

## 2021-04-29 MED ORDER — ATORVASTATIN CALCIUM 20 MG PO TABS
20.0000 mg | ORAL_TABLET | Freq: Every day | ORAL | 0 refills | Status: DC
Start: 2021-04-29 — End: 2021-07-15
  Filled 2021-04-29 – 2021-06-09 (×2): qty 90, 90d supply, fill #0

## 2021-04-29 NOTE — Progress Notes (Addendum)
Chief Complaint:   OBESITY Alejandro [name] is here to discuss his progress with his obesity treatment plan along with follow-up of his obesity related diagnoses. Alejandro [name] is on following a lower carbohydrate, vegetable and lean protein rich diet plan and states he is following his eating plan approximately 70% of the time. Alejandro [name] states he is walking for 20-30 minutes 3 times per week.  Today's visit was #: 4 Starting weight: 254 lbs Starting date: 04/09/2019 Today's weight: 217 lbs Today's date: 04/29/2021 Total lbs lost to date: 37 lbs Total lbs lost since last in-office visit: +3  Interim History: Alejandro [name] notes he has been a bit non-compliant with the plan and his "eye has not been on the plan". He feels he had a "binge" this past weekend but only had 1 piece of pizza and a Crumble cookie. He has tended to have an "all or nothing" attitude with his meal plan in the past but has been doing much better with this recently.   Subjective:   1. Prediabetes Alejandro [name] feels the Alejandro [name] is not working as well as it was at 7.5 mg weekly.  Lab Results  Component Value Date   HGBA1C 5.8 (H) 02/03/2021   Lab Results  Component Value Date   INSULIN 25.5 (H) 02/03/2021   INSULIN 14.2 06/04/2020   INSULIN 7.2 12/10/2019   INSULIN 32.3 (H) 07/31/2019   INSULIN 26.6 (H) 04/09/2019    2. Other hyperlipidemia Alejandro [name]'s LDL is now at goal (77). His LDL had been elevated previously at 168. He is on Lipitor 20 mg weekly.   Lab Results  Component Value Date   CHOL 176 02/03/2021   HDL 86 02/03/2021   LDLCALC 77 02/03/2021   LDLDIRECT 224.0 08/26/2017   TRIG 66 02/03/2021   CHOLHDL 6 08/26/2017   Lab Results  Component Value Date   ALT 27 02/03/2021   AST 18 02/03/2021   ALKPHOS 67 02/03/2021   BILITOT 0.4 02/03/2021   The 10-year ASCVD risk score (Arnett DK, et al., 2019) is: 0.4%   Values used to calculate the score:     Age: 40 years     Sex: Male     Is Non-Hispanic African  American: No     Diabetic: No     Tobacco smoker: No     Systolic Blood Pressure: 509 mmHg     Is BP treated: No     HDL Cholesterol: 86 mg/dL     Total Cholesterol: 176 mg/dL   Assessment/Plan:   1. Prediabetes Alejandro [name] agrees to increase Mounjaro 10 mg weekly with no refills.  - tirzepatide (MOUNJARO) 10 MG/0.5ML Pen; Inject 10 mg into the skin once a week.  Dispense: 6 mL; Refill: 0  2. Other hyperlipidemia  We will refill Lipitor 20 mg daily with no refills. Orders and follow up as documented in patient record.   - atorvastatin (LIPITOR) 20 MG tablet; Take 1 tablet (20 mg total) by mouth daily.  Dispense: 90 tablet; Refill: 0  3. Obesity: Current BMI 8 [name] is currently in the action stage of change. As such, his goal is to continue with weight loss efforts. He has agreed to following a lower carbohydrate, vegetable and lean protein rich diet plan.   Exercise goals:  As is.  Behavioral modification strategies: decreasing simple carbohydrates.  [name] has agreed to follow-up with our clinic in 3-4 weeks.  Objective:   Blood pressure 123/76, pulse 79, temperature 98 F (36.7 C), height 5\' 8"  (1.727  m), weight 217 lb (98.4 kg), SpO2 96 %. Body mass index is 32.99 kg/m.  General: Cooperative, alert, well developed, in no acute distress. HEENT: Conjunctivae and lids unremarkable. Cardiovascular: Regular rhythm.  Lungs: Normal work of breathing. Neurologic: No focal deficits.   Lab Results  Component Value Date   CREATININE 0.92 02/03/2021   BUN 18 02/03/2021   NA 141 02/03/2021   K 4.5 02/03/2021   CL 101 02/03/2021   CO2 22 02/03/2021   Lab Results  Component Value Date   ALT 27 02/03/2021   AST 18 02/03/2021   ALKPHOS 67 02/03/2021   BILITOT 0.4 02/03/2021   Lab Results  Component Value Date   HGBA1C 5.8 (H) 02/03/2021   HGBA1C 5.3 06/04/2020   HGBA1C 5.2 12/10/2019   HGBA1C 5.7 (H) 07/31/2019   HGBA1C 5.7 (H) 04/09/2019   Lab Results  Component  Value Date   INSULIN 25.5 (H) 02/03/2021   INSULIN 14.2 06/04/2020   INSULIN 7.2 12/10/2019   INSULIN 32.3 (H) 07/31/2019   INSULIN 26.6 (H) 04/09/2019   Lab Results  Component Value Date   TSH 1.830 04/09/2019   Lab Results  Component Value Date   CHOL 176 02/03/2021   HDL 86 02/03/2021   LDLCALC 77 02/03/2021   LDLDIRECT 224.0 08/26/2017   TRIG 66 02/03/2021   CHOLHDL 6 08/26/2017   Lab Results  Component Value Date   VD25OH 33.5 02/03/2021   VD25OH 24.8 (L) 06/04/2020   VD25OH 58.2 12/10/2019   No results found for: WBC, HGB, HCT, MCV, PLT No results found for: IRON, TIBC, FERRITIN  Attestation Statements:   Reviewed by clinician on day of visit: allergies, medications, problem list, medical history, surgical history, family history, social history, and previous encounter notes.  I, Lizbeth Bark, RMA, am acting as Location manager for [employer], [place].   I have reviewed the above documentation for accuracy and completeness, and I agree with the above. -  Georgianne Fick, FNP

## 2021-05-11 ENCOUNTER — Other Ambulatory Visit (HOSPITAL_COMMUNITY): Payer: Self-pay

## 2021-05-25 ENCOUNTER — Encounter (INDEPENDENT_AMBULATORY_CARE_PROVIDER_SITE_OTHER): Payer: Self-pay | Admitting: Family Medicine

## 2021-05-25 ENCOUNTER — Other Ambulatory Visit: Payer: Self-pay

## 2021-05-25 ENCOUNTER — Other Ambulatory Visit (HOSPITAL_COMMUNITY): Payer: Self-pay

## 2021-05-25 ENCOUNTER — Ambulatory Visit (INDEPENDENT_AMBULATORY_CARE_PROVIDER_SITE_OTHER): Payer: BC Managed Care – PPO | Admitting: Family Medicine

## 2021-05-25 VITALS — BP 126/81 | HR 87 | Temp 98.1°F | Ht 68.0 in | Wt 217.0 lb

## 2021-05-25 DIAGNOSIS — Z6838 Body mass index (BMI) 38.0-38.9, adult: Secondary | ICD-10-CM

## 2021-05-25 DIAGNOSIS — E6609 Other obesity due to excess calories: Secondary | ICD-10-CM | POA: Diagnosis not present

## 2021-05-25 DIAGNOSIS — E559 Vitamin D deficiency, unspecified: Secondary | ICD-10-CM

## 2021-05-25 DIAGNOSIS — R7303 Prediabetes: Secondary | ICD-10-CM

## 2021-05-25 MED ORDER — VITAMIN D (ERGOCALCIFEROL) 1.25 MG (50000 UNIT) PO CAPS
50000.0000 [IU] | ORAL_CAPSULE | ORAL | 0 refills | Status: DC
  Filled 2021-05-25: qty 12, 84d supply, fill #0

## 2021-05-25 MED ORDER — TIRZEPATIDE 12.5 MG/0.5ML ~~LOC~~ SOAJ
12.5000 mg | SUBCUTANEOUS | 0 refills | Status: DC
  Filled 2021-05-25 – 2021-06-09 (×2): qty 2, 28d supply, fill #0

## 2021-05-25 NOTE — Progress Notes (Signed)
Chief Complaint:   OBESITY Alejandro [name] is here to discuss his progress with his obesity treatment plan along with follow-up of his obesity related diagnoses. Alejandro [name] is on following a lower carbohydrate, vegetable and lean protein rich diet plan and states he is following his eating plan approximately 40% of the time. Alejandro [name] states he is doing HIIT for 45 minutes 3 times per week.  Today's visit was #: 59 Starting weight: 254 lbs Starting date: 04/09/2019 Today's weight: 217 lbs Today's date: 05/25/2021 Total lbs lost to date: 37 lbs Total lbs lost since last in-office visit: 0  Interim History: This is Alejandro [name]'s first office visit since Thanksgiving. He has maintained his weight. He has found he doesn't feel well if he doesn't eat well. He notes he has been inconsistent with following low carb plan but is pleased with weight maintenance. His family tends to eat out quite a bit due to his kids being heavily involved in sports. He will be traveling to [place] over Christmas which presents challenges with plan adherence. .   Subjective:   1. Pre-diabetes He feels the Mounjaro 10 mg weekly is not working as well as it was. Alejandro [name] has mild nausea occasionally.   Lab Results  Component Value Date   HGBA1C 5.8 (H) 02/03/2021   Lab Results  Component Value Date   INSULIN 25.5 (H) 02/03/2021   INSULIN 14.2 06/04/2020   INSULIN 7.2 12/10/2019   INSULIN 32.3 (H) 07/31/2019   INSULIN 26.6 (H) 04/09/2019    2. Vitamin D deficiency Alejandro [name]'s Vitamin D is low at 33.5. He is on weekly prescription Vitamin D.  Lab Results  Component Value Date   VD25OH 33.5 02/03/2021   VD25OH 24.8 (L) 06/04/2020   VD25OH 58.2 12/10/2019    Assessment/Plan:   1. Pre-diabetes Increase dose Mounjaro to 12.5 mg weekly. Alejandro [name] will continue to work on weight loss, exercise, and decreasing simple carbohydrates to help decrease the risk of diabetes.   - tirzepatide (MOUNJARO) 12.5 MG/0.5ML Pen;  Inject 12.5 mg into the skin once a week.  Dispense: 2 mL; Refill: 0  2. Vitamin D deficiency We will refill prescription Vitamin D 50,000 IU every week and Alejandro [name] will follow-up for routine testing of Vitamin D, at least 2-3 times per year to avoid over-replacement.  - Vitamin D, Ergocalciferol, (DRISDOL) 1.25 MG (50000 UNIT) CAPS capsule; Take 1 capsule (50,000 Units total) by mouth every 7 (seven) days.  Dispense: 12 capsule; Refill: 0  3. Obesity: Current BMI 67 Alejandro [name] is currently in the action stage of change. As such, his goal is to continue with weight loss efforts. He has agreed to following a lower carbohydrate, vegetable and lean protein rich diet plan.   Alejandro [name] will go to the grocery store in [place] and buy some healthy food options. He will work on meal prep and eating more at home vs eating out.  Exercise goals:  As is.  Behavioral modification strategies: decreasing simple carbohydrates, decreasing eating out, meal planning and cooking strategies, and travel eating strategies.  Alejandro [name] has agreed to follow-up with our clinic in 4 weeks.  Objective:   Blood pressure 126/81, pulse 87, temperature 98.1 F (36.7 C), height 5\' 8"  (1.727 m), weight 217 lb (98.4 kg), SpO2 96 %. Body mass index is 32.99 kg/m.  General: Cooperative, alert, well developed, in no acute distress. HEENT: Conjunctivae and lids unremarkable. Cardiovascular: Regular rhythm.  Lungs: Normal work of breathing. Neurologic: No focal deficits.   Lab Results  Component Value Date   CREATININE 0.92 02/03/2021   BUN 18 02/03/2021   NA 141 02/03/2021   K 4.5 02/03/2021   CL 101 02/03/2021   CO2 22 02/03/2021   Lab Results  Component Value Date   ALT 27 02/03/2021   AST 18 02/03/2021   ALKPHOS 67 02/03/2021   BILITOT 0.4 02/03/2021   Lab Results  Component Value Date   HGBA1C 5.8 (H) 02/03/2021   HGBA1C 5.3 06/04/2020   HGBA1C 5.2 12/10/2019   HGBA1C 5.7 (H) 07/31/2019   HGBA1C 5.7  (H) 04/09/2019   Lab Results  Component Value Date   INSULIN 25.5 (H) 02/03/2021   INSULIN 14.2 06/04/2020   INSULIN 7.2 12/10/2019   INSULIN 32.3 (H) 07/31/2019   INSULIN 26.6 (H) 04/09/2019   Lab Results  Component Value Date   TSH 1.830 04/09/2019   Lab Results  Component Value Date   CHOL 176 02/03/2021   HDL 86 02/03/2021   LDLCALC 77 02/03/2021   LDLDIRECT 224.0 08/26/2017   TRIG 66 02/03/2021   CHOLHDL 6 08/26/2017   Lab Results  Component Value Date   VD25OH 33.5 02/03/2021   VD25OH 24.8 (L) 06/04/2020   VD25OH 58.2 12/10/2019   No results found for: WBC, HGB, HCT, MCV, PLT No results found for: IRON, TIBC, FERRITIN  Attestation Statements:   Reviewed by clinician on day of visit: allergies, medications, problem list, medical history, surgical history, family history, social history, and previous encounter notes.  I, Lizbeth Bark, RMA, am acting as Location manager for [employer], [place].   I have reviewed the above documentation for accuracy and completeness, and I agree with the above. -  Georgianne Fick, FNP

## 2021-06-02 ENCOUNTER — Other Ambulatory Visit (HOSPITAL_COMMUNITY): Payer: Self-pay

## 2021-06-09 ENCOUNTER — Other Ambulatory Visit (HOSPITAL_COMMUNITY): Payer: Self-pay

## 2021-06-22 ENCOUNTER — Ambulatory Visit (INDEPENDENT_AMBULATORY_CARE_PROVIDER_SITE_OTHER): Payer: BC Managed Care – PPO | Admitting: Family Medicine

## 2021-06-22 ENCOUNTER — Encounter (INDEPENDENT_AMBULATORY_CARE_PROVIDER_SITE_OTHER): Payer: Self-pay

## 2021-06-30 ENCOUNTER — Ambulatory Visit (INDEPENDENT_AMBULATORY_CARE_PROVIDER_SITE_OTHER): Payer: BC Managed Care – PPO | Admitting: Family Medicine

## 2021-06-30 ENCOUNTER — Other Ambulatory Visit: Payer: Self-pay

## 2021-06-30 ENCOUNTER — Other Ambulatory Visit (HOSPITAL_COMMUNITY): Payer: Self-pay

## 2021-06-30 ENCOUNTER — Encounter (INDEPENDENT_AMBULATORY_CARE_PROVIDER_SITE_OTHER): Payer: Self-pay | Admitting: Family Medicine

## 2021-06-30 VITALS — BP 110/76 | HR 82 | Temp 97.8°F | Ht 68.0 in | Wt 215.0 lb

## 2021-06-30 DIAGNOSIS — Z6838 Body mass index (BMI) 38.0-38.9, adult: Secondary | ICD-10-CM

## 2021-06-30 DIAGNOSIS — Z6832 Body mass index (BMI) 32.0-32.9, adult: Secondary | ICD-10-CM | POA: Diagnosis not present

## 2021-06-30 DIAGNOSIS — R7303 Prediabetes: Secondary | ICD-10-CM | POA: Diagnosis not present

## 2021-06-30 DIAGNOSIS — E6609 Other obesity due to excess calories: Secondary | ICD-10-CM

## 2021-06-30 DIAGNOSIS — E782 Mixed hyperlipidemia: Secondary | ICD-10-CM

## 2021-06-30 MED ORDER — TIRZEPATIDE 12.5 MG/0.5ML ~~LOC~~ SOAJ
12.5000 mg | SUBCUTANEOUS | 0 refills | Status: DC
  Filled 2021-06-30: qty 2, 28d supply, fill #0

## 2021-06-30 NOTE — Progress Notes (Signed)
Chief Complaint:   OBESITY Alejandro [name] is here to discuss his progress with his obesity treatment plan along with follow-up of his obesity related diagnoses. Alejandro [name] is on following a lower carbohydrate, vegetable and lean protein rich diet plan and states he is following his eating plan approximately 60-70% of the time. Alejandro [name] states he is walking the dog 15-20 minutes 7 times per week.  Today's visit was #: 38 Starting weight: 254 lbs Starting date: 04/09/2019 Today's weight: 215 lbs Today's date: 06/30/2021 Total lbs lost to date: 39 lbs Total lbs lost since last in-office visit: 2 lbs  Interim History: Alejandro [name] did well on his trip to [place] over Christmas. He actually shopped for food so that he could eat healthier on the trip. He feels great about this. He has not been exercising due to fear of back injury. He suffered a sever back injury a few months ago from exercising.   Subjective:   1. Mixed hyperlipidemia Alejandro [name]'s hyperlipidemia is controlled. He is on Lipitor 20 mg daily.  Lab Results  Component Value Date   CHOL 176 02/03/2021   HDL 86 02/03/2021   LDLCALC 77 02/03/2021   LDLDIRECT 224.0 08/26/2017   TRIG 66 02/03/2021   CHOLHDL 6 08/26/2017   Lab Results  Component Value Date   ALT 27 02/03/2021   AST 18 02/03/2021   ALKPHOS 67 02/03/2021   BILITOT 0.4 02/03/2021   The 10-year ASCVD risk score (Arnett DK, et al., 2019) is: 0.4%   Values used to calculate the score:     Age: 41 years     Sex: Male     Is Non-Hispanic African American: No     Diabetic: No     Tobacco smoker: No     Systolic Blood Pressure: 329 mmHg     Is BP treated: No     HDL Cholesterol: 86 mg/dL     Total Cholesterol: 176 mg/dL  2. Pre-diabetes Alejandro [name]'s last A1C was 5.8 (elevated). He is on Mounjaro 12.5 mg weekly. He notes eructation and mild waves of nausea.  Lab Results  Component Value Date   HGBA1C 5.8 (H) 02/03/2021   Lab Results  Component Value Date    INSULIN 25.5 (H) 02/03/2021   INSULIN 14.2 06/04/2020   INSULIN 7.2 12/10/2019   INSULIN 32.3 (H) 07/31/2019   INSULIN 26.6 (H) 04/09/2019    Assessment/Plan:   1. Mixed hyperlipidemia Cardiovascular risk and specific lipid/LDL goals reviewed.  Alejandro [name] will continue Lipitor.   2. Pre-diabetes We will refill Mounjaro 12.5 mg weekly. Alejandro [name] will continue to work on weight loss, exercise, and decreasing simple carbohydrates to help decrease the risk of diabetes.   - tirzepatide (MOUNJARO) 12.5 MG/0.5ML Pen; Inject 12.5 mg into the skin once a week.  Dispense: 2 mL; Refill: 0  3. Obesity: Current BMI 32.7 Alejandro [name] is currently in the action stage of change. As such, his goal is to continue with weight loss efforts. He has agreed to following a lower carbohydrate, vegetable and lean protein rich diet plan.   Exercise goals: he will restart exercise that is not harmful to his back such as yoga and walking.   Behavioral modification strategies: planning for success.  Elim has agreed to follow-up with our clinic in 4 weeks.  Objective:   Blood pressure 110/76, pulse 82, temperature 97.8 F (36.6 C), height 5\' 8"  (1.727 m), weight 215 lb (97.5 kg), SpO2 98 %. Body mass index is 32.69 kg/m.  General: Cooperative, alert,  well developed, in no acute distress. HEENT: Conjunctivae and lids unremarkable. Cardiovascular: Regular rhythm.  Lungs: Normal work of breathing. Neurologic: No focal deficits.   Lab Results  Component Value Date   CREATININE 0.92 02/03/2021   BUN 18 02/03/2021   NA 141 02/03/2021   K 4.5 02/03/2021   CL 101 02/03/2021   CO2 22 02/03/2021   Lab Results  Component Value Date   ALT 27 02/03/2021   AST 18 02/03/2021   ALKPHOS 67 02/03/2021   BILITOT 0.4 02/03/2021   Lab Results  Component Value Date   HGBA1C 5.8 (H) 02/03/2021   HGBA1C 5.3 06/04/2020   HGBA1C 5.2 12/10/2019   HGBA1C 5.7 (H) 07/31/2019   HGBA1C 5.7 (H) 04/09/2019   Lab Results   Component Value Date   INSULIN 25.5 (H) 02/03/2021   INSULIN 14.2 06/04/2020   INSULIN 7.2 12/10/2019   INSULIN 32.3 (H) 07/31/2019   INSULIN 26.6 (H) 04/09/2019   Lab Results  Component Value Date   TSH 1.830 04/09/2019   Lab Results  Component Value Date   CHOL 176 02/03/2021   HDL 86 02/03/2021   LDLCALC 77 02/03/2021   LDLDIRECT 224.0 08/26/2017   TRIG 66 02/03/2021   CHOLHDL 6 08/26/2017   Lab Results  Component Value Date   VD25OH 33.5 02/03/2021   VD25OH 24.8 (L) 06/04/2020   VD25OH 58.2 12/10/2019   No results found for: WBC, HGB, HCT, MCV, PLT No results found for: IRON, TIBC, FERRITIN  Attestation Statements:   Reviewed by clinician on day of visit: allergies, medications, problem list, medical history, surgical history, family history, social history, and previous encounter notes.  I, Lizbeth Bark, RMA, am acting as Location manager for [employer], [place].  I have reviewed the above documentation for accuracy and completeness, and I agree with the above. -  Georgianne Fick, FNP

## 2021-07-01 ENCOUNTER — Encounter (INDEPENDENT_AMBULATORY_CARE_PROVIDER_SITE_OTHER): Payer: Self-pay | Admitting: Family Medicine

## 2021-07-01 ENCOUNTER — Other Ambulatory Visit (HOSPITAL_COMMUNITY): Payer: Self-pay

## 2021-07-02 ENCOUNTER — Other Ambulatory Visit (HOSPITAL_COMMUNITY): Payer: Self-pay

## 2021-07-02 DIAGNOSIS — I10 Essential (primary) hypertension: Secondary | ICD-10-CM | POA: Diagnosis not present

## 2021-07-02 DIAGNOSIS — Z79899 Other long term (current) drug therapy: Secondary | ICD-10-CM | POA: Diagnosis not present

## 2021-07-02 DIAGNOSIS — R0602 Shortness of breath: Secondary | ICD-10-CM | POA: Diagnosis not present

## 2021-07-02 DIAGNOSIS — R0789 Other chest pain: Secondary | ICD-10-CM | POA: Diagnosis not present

## 2021-07-02 DIAGNOSIS — E119 Type 2 diabetes mellitus without complications: Secondary | ICD-10-CM | POA: Diagnosis not present

## 2021-07-02 DIAGNOSIS — I214 Non-ST elevation (NSTEMI) myocardial infarction: Secondary | ICD-10-CM | POA: Diagnosis not present

## 2021-07-02 DIAGNOSIS — E669 Obesity, unspecified: Secondary | ICD-10-CM | POA: Diagnosis not present

## 2021-07-02 DIAGNOSIS — Z6833 Body mass index (BMI) 33.0-33.9, adult: Secondary | ICD-10-CM | POA: Diagnosis not present

## 2021-07-02 DIAGNOSIS — E785 Hyperlipidemia, unspecified: Secondary | ICD-10-CM | POA: Diagnosis not present

## 2021-07-02 DIAGNOSIS — R079 Chest pain, unspecified: Secondary | ICD-10-CM | POA: Diagnosis not present

## 2021-07-03 DIAGNOSIS — R079 Chest pain, unspecified: Secondary | ICD-10-CM | POA: Diagnosis not present

## 2021-07-03 DIAGNOSIS — R0602 Shortness of breath: Secondary | ICD-10-CM | POA: Diagnosis not present

## 2021-07-03 DIAGNOSIS — I214 Non-ST elevation (NSTEMI) myocardial infarction: Secondary | ICD-10-CM | POA: Diagnosis not present

## 2021-07-03 DIAGNOSIS — Z6833 Body mass index (BMI) 33.0-33.9, adult: Secondary | ICD-10-CM | POA: Diagnosis not present

## 2021-07-03 DIAGNOSIS — E785 Hyperlipidemia, unspecified: Secondary | ICD-10-CM | POA: Diagnosis not present

## 2021-07-03 DIAGNOSIS — I10 Essential (primary) hypertension: Secondary | ICD-10-CM | POA: Diagnosis not present

## 2021-07-03 DIAGNOSIS — E119 Type 2 diabetes mellitus without complications: Secondary | ICD-10-CM | POA: Diagnosis not present

## 2021-07-03 DIAGNOSIS — E669 Obesity, unspecified: Secondary | ICD-10-CM | POA: Diagnosis not present

## 2021-07-03 DIAGNOSIS — Z79899 Other long term (current) drug therapy: Secondary | ICD-10-CM | POA: Diagnosis not present

## 2021-07-06 ENCOUNTER — Encounter: Payer: Self-pay | Admitting: Family Medicine

## 2021-07-06 ENCOUNTER — Ambulatory Visit (INDEPENDENT_AMBULATORY_CARE_PROVIDER_SITE_OTHER): Payer: BC Managed Care – PPO | Admitting: Family Medicine

## 2021-07-06 ENCOUNTER — Other Ambulatory Visit (HOSPITAL_COMMUNITY): Payer: Self-pay

## 2021-07-06 ENCOUNTER — Other Ambulatory Visit: Payer: Self-pay

## 2021-07-06 VITALS — BP 108/72 | HR 80 | Temp 97.6°F | Ht 68.0 in | Wt 219.0 lb

## 2021-07-06 DIAGNOSIS — F4321 Adjustment disorder with depressed mood: Secondary | ICD-10-CM

## 2021-07-06 DIAGNOSIS — F3289 Other specified depressive episodes: Secondary | ICD-10-CM | POA: Diagnosis not present

## 2021-07-06 DIAGNOSIS — I5181 Takotsubo syndrome: Secondary | ICD-10-CM

## 2021-07-06 LAB — CBC WITH DIFFERENTIAL/PLATELET
Basophils Absolute: 0.1 10*3/uL (ref 0.0–0.1)
Basophils Relative: 1 % (ref 0.0–3.0)
Eosinophils Absolute: 0 10*3/uL (ref 0.0–0.7)
Eosinophils Relative: 0.7 % (ref 0.0–5.0)
HCT: 47.7 % (ref 39.0–52.0)
Hemoglobin: 16.2 g/dL (ref 13.0–17.0)
Lymphocytes Relative: 45.2 % (ref 12.0–46.0)
Lymphs Abs: 3 10*3/uL (ref 0.7–4.0)
MCHC: 33.9 g/dL (ref 30.0–36.0)
MCV: 87.2 fl (ref 78.0–100.0)
Monocytes Absolute: 0.5 10*3/uL (ref 0.1–1.0)
Monocytes Relative: 6.7 % (ref 3.0–12.0)
Neutro Abs: 3.1 10*3/uL (ref 1.4–7.7)
Neutrophils Relative %: 46.4 % (ref 43.0–77.0)
Platelets: 442 10*3/uL — ABNORMAL HIGH (ref 150.0–400.0)
RBC: 5.47 Mil/uL (ref 4.22–5.81)
RDW: 13.2 % (ref 11.5–15.5)
WBC: 6.7 10*3/uL (ref 4.0–10.5)

## 2021-07-06 LAB — BASIC METABOLIC PANEL
BUN: 13 mg/dL (ref 6–23)
CO2: 26 mEq/L (ref 19–32)
Calcium: 10 mg/dL (ref 8.4–10.5)
Chloride: 101 mEq/L (ref 96–112)
Creatinine, Ser: 0.91 mg/dL (ref 0.40–1.50)
GFR: 104.26 mL/min (ref >60.00)
Glucose, Bld: 83 mg/dL (ref 70–99)
Potassium: 4 mEq/L (ref 3.5–5.1)
Sodium: 138 mEq/L (ref 135–145)

## 2021-07-06 MED ORDER — ALPRAZOLAM 0.25 MG PO TABS
0.2500 mg | ORAL_TABLET | Freq: Two times a day (BID) | ORAL | 0 refills | Status: DC | PRN
Start: 2021-07-06 — End: 2021-07-24
  Filled 2021-07-06: qty 20, 10d supply, fill #0

## 2021-07-06 MED ORDER — ESCITALOPRAM OXALATE 20 MG PO TABS
20.0000 mg | ORAL_TABLET | Freq: Every day | ORAL | 3 refills | Status: DC
  Filled 2021-07-06: qty 90, 90d supply, fill #0

## 2021-07-06 NOTE — Progress Notes (Signed)
This visit occurred during the SARS-CoV-2 public health emergency.  Safety protocols were in place, including screening questions prior to the visit, additional usage of staff PPE, and extensive cleaning of exam room while observing appropriate contact time as indicated for disinfecting solutions.  Inpatient follow up.  Lost his job unexpectedly and this is clearly been stressful for the patient.  Shortly thereafter he was [place] the mountains skiing with his [place].  Had DOE, felt weak and atypically exhausted.  Then had chest pain. He went to ER via EMS with troponin elevation.  Cath noted, dx'd with takotsubo.  Inpatient course and pathophysiology discussed with patient.  Mood d/w pt.  He had prev suicidal thoughts, shame, anger, depression and worry.  He is going to see psychiatry and counseling in the near future.  We talked about his medications and option going forward. Contracts for safety.    He ran out of lexapro last week, before the event.    Meds, vitals, and allergies reviewed.   ROS: Per HPI unless specifically indicated in ROS section   GEN: nad, alert and oriented HEENT: ncat NECK: supple w/o LA CV: rrr PULM: ctab, no inc wob ABD: soft, +bs EXT: no edema SKIN: no acute rash  40 minutes were devoted to patient care in this encounter (this includes time spent reviewing the patient's file/history, interviewing and examining the patient, counseling/reviewing plan with patient).

## 2021-07-06 NOTE — Patient Instructions (Addendum)
Go to the lab on the way out.   If you have mychart we'll likely use that to update you.    Keep the appointment with cards.  Ask about psychology and psychiatry at [facility].  Take care.  Glad to see you. Restart lexapro.  Xanax as needed.  Update me in about 2-3 days.

## 2021-07-07 ENCOUNTER — Ambulatory Visit (HOSPITAL_COMMUNITY)
Admission: RE | Admit: 2021-07-07 | Discharge: 2021-07-07 | Disposition: A | Discharge status: Home / Self Care | Payer: BC Managed Care – PPO | Source: Ambulatory Visit | Attending: Cardiology | Admitting: Cardiology

## 2021-07-07 ENCOUNTER — Encounter (HOSPITAL_COMMUNITY): Payer: Self-pay | Admitting: Cardiology

## 2021-07-07 ENCOUNTER — Other Ambulatory Visit (HOSPITAL_COMMUNITY): Payer: Self-pay

## 2021-07-07 VITALS — BP 120/78 | HR 77 | Wt 218.2 lb

## 2021-07-07 DIAGNOSIS — E8881 Metabolic syndrome: Secondary | ICD-10-CM | POA: Diagnosis not present

## 2021-07-07 DIAGNOSIS — E785 Hyperlipidemia, unspecified: Secondary | ICD-10-CM | POA: Diagnosis not present

## 2021-07-07 DIAGNOSIS — F419 Anxiety disorder, unspecified: Secondary | ICD-10-CM | POA: Insufficient documentation

## 2021-07-07 DIAGNOSIS — I252 Old myocardial infarction: Secondary | ICD-10-CM | POA: Insufficient documentation

## 2021-07-07 DIAGNOSIS — E663 Overweight: Secondary | ICD-10-CM | POA: Insufficient documentation

## 2021-07-07 DIAGNOSIS — E88819 Insulin resistance, unspecified: Secondary | ICD-10-CM

## 2021-07-07 DIAGNOSIS — I429 Cardiomyopathy, unspecified: Secondary | ICD-10-CM | POA: Diagnosis not present

## 2021-07-07 DIAGNOSIS — I514 Myocarditis, unspecified: Secondary | ICD-10-CM

## 2021-07-07 DIAGNOSIS — R7989 Other specified abnormal findings of blood chemistry: Secondary | ICD-10-CM | POA: Diagnosis not present

## 2021-07-07 DIAGNOSIS — E782 Mixed hyperlipidemia: Secondary | ICD-10-CM

## 2021-07-07 DIAGNOSIS — F32A Depression, unspecified: Secondary | ICD-10-CM | POA: Insufficient documentation

## 2021-07-07 DIAGNOSIS — R7303 Prediabetes: Secondary | ICD-10-CM

## 2021-07-07 DIAGNOSIS — I5181 Takotsubo syndrome: Secondary | ICD-10-CM

## 2021-07-07 LAB — CBC
HCT: 49.6 % (ref 39.0–52.0)
Hemoglobin: 17 g/dL (ref 13.0–17.0)
MCH: 29.7 pg (ref 26.0–34.0)
MCHC: 34.3 g/dL (ref 30.0–36.0)
MCV: 86.6 fL (ref 80.0–100.0)
Platelets: 494 10*3/uL — ABNORMAL HIGH (ref 150–400)
RBC: 5.73 MIL/uL (ref 4.22–5.81)
RDW: 12.4 % (ref 11.5–15.5)
WBC: 6.4 10*3/uL (ref 4.0–10.5)
nRBC: 0 % (ref 0.0–0.2)

## 2021-07-07 LAB — HEPATIC FUNCTION PANEL
ALT: 26 U/L (ref 0–44)
AST: 21 U/L (ref 15–41)
Albumin: 4.3 g/dL (ref 3.5–5.0)
Alkaline Phosphatase: 46 U/L (ref 38–126)
Bilirubin, Direct: <0.1 mg/dL (ref 0.0–0.2)
Total Bilirubin: 0.7 mg/dL (ref 0.3–1.2)
Total Protein: 7.7 g/dL (ref 6.5–8.1)

## 2021-07-07 LAB — HEMOGLOBIN A1C
Hgb A1c MFr Bld: 5.2 % (ref 4.8–5.6)
Mean Plasma Glucose: 103 mg/dL

## 2021-07-07 LAB — BASIC METABOLIC PANEL
Anion gap: 9 (ref 5–15)
BUN: 11 mg/dL (ref 6–20)
CO2: 24 mmol/L (ref 22–32)
Calcium: 9.4 mg/dL (ref 8.9–10.3)
Chloride: 103 mmol/L (ref 98–111)
Creatinine, Ser: 0.89 mg/dL (ref 0.61–1.24)
GFR, Estimated: >60 mL/min (ref >60)
Glucose, Bld: 95 mg/dL (ref 70–99)
Potassium: 4.3 mmol/L (ref 3.5–5.1)
Sodium: 136 mmol/L (ref 135–145)

## 2021-07-07 LAB — LIPID PANEL
Cholesterol: 162 mg/dL (ref 0–200)
HDL: 59 mg/dL (ref >40)
LDL Cholesterol: 88 mg/dL (ref 0–99)
Total CHOL/HDL Ratio: 2.7 RATIO
Triglycerides: 77 mg/dL (ref <150)
VLDL: 15 mg/dL (ref 0–40)

## 2021-07-07 MED ORDER — CARVEDILOL 3.125 MG PO TABS
3.1250 mg | ORAL_TABLET | Freq: Two times a day (BID) | ORAL | 6 refills | Status: DC
Start: 2021-07-07 — End: 2021-10-20
  Filled 2021-07-07: qty 60, 30d supply, fill #0

## 2021-07-07 NOTE — Patient Instructions (Signed)
START Coreg 3.125mg  (1 tab) twice a day  Labs today We will only contact you if something comes back abnormal or we need to make some changes. Otherwise no news is good news!  Your physician has requested that you have an echocardiogram. Echocardiography is a painless test that uses sound waves to create images of your heart. It provides your doctor with information about the size and shape of your heart and how well your hearts chambers and valves are working. This procedure takes approximately one hour. There are no restrictions for this procedure.  You have been ordered for an MRI of your Heart. You will receive a call to schedule this test once your insurance gives authorization.  Your physician recommends that you schedule a follow-up appointment in: 3 weeks with Dr Aundra Dubin  Please call office at [phone] option 2 if you have any questions or concerns.   At the [facility], you and your health needs are our priority. As part of our continuing mission to provide you with exceptional heart care, we have created designated Provider Care Teams. These Care Teams include your primary Cardiologist (physician) and Advanced Practice Providers (APPs- Physician Assistants and Nurse Practitioners) who all work together to provide you with the care you need, when you need it.   You may see any of the following providers on your designated Care Team at your next follow up: Dr Glori Bickers Dr Haynes Kerns, NP Lyda Jester, [place] [facility] [place], [place] Audry Riles, PharmD   Please be sure to bring in all your medications bottles to every appointment.

## 2021-07-07 NOTE — Progress Notes (Signed)
PCP: Tonia Ghent, MD Cardiology: Dr. Aundra Dubin  41 y.o. 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  Patient had no prior cardiac history.  He has been on atorvastatin for elevated cholesterol and has been on tirzepatide to aid with weight loss.  He lost his job right before Christmas 2022 and has been under a lot of stress.  He began to note episodes of chest pain after this, mild squeezing in the central chest with stress.  He went skiing with his kids in 1/23.  At the top of a slope, he developed severe substernal chest tightness that did not relent.  He was taken to the hospital in [place] by EMS, ECG abnormalities were noted.  Troponin was mildly elevated. CTA chest showed no PE or dissection.  He had LHC, showing no significant coronary disease but there was apical dyskinesis and overall EF 60%.  This was thought to be consistent with stress (Takotsubo-type) cardiomyopathy.  He has a family history of CAD/MI, no h/o cardiomyopathy.  No recent viral illness or fever.  He does not smoke or drink ETOH.   He is doing better.  Initially after the event, he continued to have episodes of mild chest pain but is not having any further pain now.  No significant exertional dyspnea.  He is generally active with his kids and walks for exercise.  He still has significant anxiety.   ECG (personally reviewed): NSR, inferolateral TWIs, QTc 477 msec  Labs (8/22): LDL 77, HDL 86 Labs (1/23): K 4, creatinine 0.91, plts 442  PMH: 1. Hyperlipidemia 2. Depression/anxiety 3. Pre-diabetes 4. H/o ruptured disc in back. 5. Stress (Takotsubo-type) cardiomyopathy: NSTEMI in 1/23 while skiing.   - LHC (1/23): No significant coronary disease, apical dyskinesis on LV-gram with EF 60%.   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  No smoking, no ETOH.   FH: Grandfather with MI in his 47s, other grandfather with MI in his  62s.   ROS: All systems reviewed and negative except as per HPI.   Current Outpatient Medications  Medication Sig Dispense Refill   ALPRAZolam (XANAX) 0.25 MG tablet Take 1 tablet (0.25 mg total) by mouth 2 (two) times daily as needed for anxiety. 20 tablet 0   aspirin EC 81 MG tablet Take 81 mg by mouth daily. Swallow whole.     atorvastatin (LIPITOR) 20 MG tablet Take 1 tablet (20 mg total) by mouth daily. 90 tablet 0   carvedilol (COREG) 3.125 MG tablet Take 1 tablet (3.125 mg total) by mouth 2 (two) times daily with a meal. 60 tablet 6   cyclobenzaprine (FLEXERIL) 10 MG tablet Take 10 mg by mouth as needed.     escitalopram (LEXAPRO) 20 MG tablet Take 1 tablet (20 mg total) by mouth daily. 90 tablet 3   tirzepatide (MOUNJARO) 12.5 MG/0.5ML Pen Inject 12.5 mg into the skin once a week. 2 mL 0   Vitamin D, Ergocalciferol, (DRISDOL) 1.25 MG (50000 UNIT) CAPS capsule Take 1 capsule (50,000 Units total) by mouth every 7 (seven) days. 12 capsule 0   No current facility-administered medications for this encounter.   Blood pressure 120/78, pulse 77, weight 99 kg (218 lb 3.2 oz), SpO2 97 %. General: NAD Neck: No JVD, no thyromegaly or thyroid nodule.  Lungs: Clear to auscultation bilaterally with normal respiratory effort. CV: Nondisplaced PMI.  Heart regular S1/S2, no S3/S4, no murmur.  No peripheral edema.  No carotid bruit.  Normal pedal pulses.  Abdomen: Soft, nontender, no hepatosplenomegaly, no distention.  Skin: Intact without lesions or rashes.  Neurologic: Alert and oriented x 3.  Psych: Normal affect. Extremities: No clubbing or cyanosis.  HEENT: Normal.   Assessment/Plan: 1. Cardiomyopathy: Patient presented with NSTEMI in 1/23, cath showed no significant coronary disease with LV-gram showing EF 60% but apical dyskinesis.  Given significant stressors and anxiety, this episode most likely represents a stress (Takotsubo-type) cardiomyopathy.  No viral prodrome to suggest viral  myocarditis.  He is euvolemic on exam, NYHA class I symptoms.  I would expect LV function to recovery over the next month or so with stress cardiomyopathy.  - I will arrange for echocardiogram, this was not done during initial hospitalization.  - I would like him to get a cardiac MRI to assess for any evidence for myocarditis.  - I would like him to start on Coreg 3.125 mg bid.   - If initial studies still show apical wall motion abnormality, will need to repeat echo in a couple of months to look for resolution.  - I will have him take ASA 81 daily.  - Recurrence is possible, he needs to work on stress management/anxiety control.  2. Hyperlipidemia: He is on atorvastatin.  Given strong family history of CAD (GF with MI in early 100s), would like to see LDL < 70.  - Check lipids today.  3. Depression/anxiety: He is working on getting in with a psychiatrist.  I think this is a good idea.  4. Overweight: [facility] and is taking terzepatide.   Followup in 3 wks.   Loralie Champagne 07/07/2021

## 2021-07-08 ENCOUNTER — Other Ambulatory Visit: Payer: Self-pay

## 2021-07-08 ENCOUNTER — Ambulatory Visit (HOSPITAL_COMMUNITY)
Admission: RE | Admit: 2021-07-08 | Discharge: 2021-07-08 | Disposition: A | Discharge status: Home / Self Care | Payer: BC Managed Care – PPO | Source: Ambulatory Visit | Attending: Cardiology | Admitting: Cardiology

## 2021-07-08 DIAGNOSIS — I429 Cardiomyopathy, unspecified: Secondary | ICD-10-CM | POA: Diagnosis not present

## 2021-07-08 DIAGNOSIS — E785 Hyperlipidemia, unspecified: Secondary | ICD-10-CM | POA: Insufficient documentation

## 2021-07-08 DIAGNOSIS — I5181 Takotsubo syndrome: Secondary | ICD-10-CM | POA: Insufficient documentation

## 2021-07-08 DIAGNOSIS — F4321 Adjustment disorder with depressed mood: Secondary | ICD-10-CM | POA: Insufficient documentation

## 2021-07-08 DIAGNOSIS — I2510 Atherosclerotic heart disease of native coronary artery without angina pectoris: Secondary | ICD-10-CM | POA: Diagnosis not present

## 2021-07-08 DIAGNOSIS — G4730 Sleep apnea, unspecified: Secondary | ICD-10-CM | POA: Insufficient documentation

## 2021-07-08 DIAGNOSIS — I10 Essential (primary) hypertension: Secondary | ICD-10-CM | POA: Insufficient documentation

## 2021-07-08 LAB — ECHOCARDIOGRAM COMPLETE
Area-P 1/2: 3.27 cm2
Calc EF: 57.8 %
S' Lateral: 3.1 cm
Single Plane A2C EF: 60.9 %
Single Plane A4C EF: 53 %

## 2021-07-08 NOTE — Assessment & Plan Note (Signed)
Over the loss of his job.  Contracts for safety.  Still okay for outpatient follow-up.  Restart Lexapro.  He can use Xanax with strict sedation cautions.  Routine benzodiazepine cautions discussed with patient.  Caution not to use with alcohol.  He is going to follow-up with psychiatry and counseling.  I think that makes sense.

## 2021-07-08 NOTE — Assessment & Plan Note (Signed)
NSTEMI in the setting of clean coronary anatomy, with clear troponin elevation in the setting of a stressful event.  Presumed Takotsubo event.  He is going to follow-up with cardiology nothing that makes sense.  See notes on follow-up labs.  We will get his outside records scanned.  He is going to follow-up with psychiatry also we talked about options there.

## 2021-07-08 NOTE — Progress Notes (Signed)
°  Echocardiogram 2D Echocardiogram has been performed.  Alejandro [name] 07/08/2021, 2:58 PM

## 2021-07-09 ENCOUNTER — Other Ambulatory Visit (HOSPITAL_COMMUNITY): Payer: Self-pay | Admitting: *Deleted

## 2021-07-09 ENCOUNTER — Telehealth (HOSPITAL_COMMUNITY): Payer: Self-pay | Admitting: *Deleted

## 2021-07-09 DIAGNOSIS — I514 Myocarditis, unspecified: Secondary | ICD-10-CM

## 2021-07-10 ENCOUNTER — Other Ambulatory Visit (HOSPITAL_COMMUNITY): Payer: Self-pay | Admitting: *Deleted

## 2021-07-10 ENCOUNTER — Telehealth (HOSPITAL_COMMUNITY): Payer: Self-pay | Admitting: *Deleted

## 2021-07-10 DIAGNOSIS — R0789 Other chest pain: Secondary | ICD-10-CM

## 2021-07-13 ENCOUNTER — Ambulatory Visit (HOSPITAL_COMMUNITY)
Admission: RE | Admit: 2021-07-13 | Discharge: 2021-07-13 | Disposition: A | Discharge status: Home / Self Care | Payer: BC Managed Care – PPO | Source: Ambulatory Visit | Attending: Cardiology | Admitting: Cardiology

## 2021-07-13 ENCOUNTER — Other Ambulatory Visit: Payer: Self-pay

## 2021-07-13 DIAGNOSIS — I514 Myocarditis, unspecified: Secondary | ICD-10-CM | POA: Insufficient documentation

## 2021-07-13 DIAGNOSIS — J9811 Atelectasis: Secondary | ICD-10-CM | POA: Diagnosis not present

## 2021-07-13 MED ORDER — IOHEXOL 350 MG/ML SOLN
100.0000 mL | Freq: Once | INTRAVENOUS | Status: AC | PRN
  Administered 2021-07-13: 100 mL via INTRAVENOUS

## 2021-07-15 ENCOUNTER — Other Ambulatory Visit (HOSPITAL_COMMUNITY): Payer: Self-pay

## 2021-07-15 ENCOUNTER — Telehealth (HOSPITAL_COMMUNITY): Payer: Self-pay | Admitting: Cardiology

## 2021-07-15 DIAGNOSIS — E7849 Other hyperlipidemia: Secondary | ICD-10-CM

## 2021-07-15 MED ORDER — ATORVASTATIN CALCIUM 40 MG PO TABS
40.0000 mg | ORAL_TABLET | Freq: Every day | ORAL | 3 refills | Status: DC
  Filled 2021-07-15: qty 90, 90d supply, fill #0

## 2021-07-15 NOTE — Telephone Encounter (Signed)
Patient called.  Patient aware.  

## 2021-07-15 NOTE — Telephone Encounter (Signed)
-----   Message from Larey Dresser, MD sent at 07/07/2021  4:37 PM EST ----- Increase atorvastatin to 40 mg daily, goal LDL < 70.  Lipids/LFTs in 2 months.

## 2021-07-20 ENCOUNTER — Telehealth (HOSPITAL_COMMUNITY): Payer: Self-pay | Admitting: Emergency Medicine

## 2021-07-20 NOTE — Telephone Encounter (Signed)
Reaching out to patient to offer assistance regarding upcoming cardiac imaging study; pt verbalizes understanding of appt date/time, parking situation and where to check in, pre-test NPO status and medications ordered, and verified current allergies; name and call back number provided for further questions should they arise Alejandro Bond RN Navigator Cardiac Imaging [facility] [phone] office [phone] cell  Denies iv issues Denies metal implants Some claustro- will take home xanax Arrival [phone]

## 2021-07-20 NOTE — Telephone Encounter (Signed)
Attempted to call patient regarding upcoming cardiac MR appointment. Left message on voicemail with name and callback number Johnye Kist RN Navigator Cardiac Imaging [facility] [phone] Office [phone] Cell  

## 2021-07-21 ENCOUNTER — Ambulatory Visit (HOSPITAL_COMMUNITY)
Admission: RE | Admit: 2021-07-21 | Discharge: 2021-07-21 | Disposition: A | Discharge status: Home / Self Care | Payer: Commercial Managed Care - PPO | Source: Ambulatory Visit | Attending: Cardiology | Admitting: Cardiology

## 2021-07-21 ENCOUNTER — Other Ambulatory Visit: Payer: Self-pay

## 2021-07-21 DIAGNOSIS — I514 Myocarditis, unspecified: Secondary | ICD-10-CM | POA: Diagnosis not present

## 2021-07-21 MED ORDER — GADOBUTROL 1 MMOL/ML IV SOLN
10.0000 mL | Freq: Once | INTRAVENOUS | Status: AC | PRN
  Administered 2021-07-21: 10 mL via INTRAVENOUS

## 2021-07-24 ENCOUNTER — Ambulatory Visit (INDEPENDENT_AMBULATORY_CARE_PROVIDER_SITE_OTHER): Payer: BC Managed Care – PPO | Admitting: Behavioral Health

## 2021-07-24 ENCOUNTER — Other Ambulatory Visit: Payer: Self-pay

## 2021-07-24 ENCOUNTER — Encounter: Payer: Self-pay | Admitting: Behavioral Health

## 2021-07-24 ENCOUNTER — Other Ambulatory Visit (HOSPITAL_COMMUNITY): Payer: Self-pay

## 2021-07-24 VITALS — BP 129/89 | HR 78 | Ht 68.0 in | Wt 216.0 lb

## 2021-07-24 DIAGNOSIS — F331 Major depressive disorder, recurrent, moderate: Secondary | ICD-10-CM | POA: Diagnosis not present

## 2021-07-24 DIAGNOSIS — F411 Generalized anxiety disorder: Secondary | ICD-10-CM

## 2021-07-24 MED ORDER — VILAZODONE HCL 20 MG PO TABS: ORAL_TABLET | ORAL | 1 refills | Status: DC

## 2021-07-24 MED ORDER — ALPRAZOLAM 0.25 MG PO TABS: 0.2500 mg | ORAL_TABLET | Freq: Two times a day (BID) | ORAL | 0 refills | Status: DC | PRN

## 2021-07-24 NOTE — Progress Notes (Signed)
[facility] MD/PA/NP Initial Note  07/27/2021 11:28 AM Alejandro [name]  MRN:  [id number]  Chief Complaint:  Chief Complaint   Anxiety; Depression; Establish Care; Medication Problem; Patient Education     HPI:   41 year old male presents to this office for initial visit and to establish care. Says that he has suffered from depression for the last 4-5 years and has been taking Lexapro. Says thing changed in his life when he experienced an MI while snow skiing with his children in January 2023. They were not able to determine a blockage but concluded it was stressed induced. Says MI was indicated by cardiac enzyme panel.  Says he started to have problems on his job last year and decided to change companies. Says that he felt like they had targeted him for a non compete and attempted to force him out. Says he was under excessive amounts of stress and he has not been able to find the right direction in his life again. He feel like he has progressively got worse and not getting out of this slump.  He Is currently receiving psychotherapy but feels like his medication is not working. He would like to consider another medication to help with anxiety and depression. Says his anxiety is 5/10 today and his depression is 6/10. He is sleeping 7-8 hours per night. He was negative on MDQ. He denies mania, no psychosis. He has some passive SI without plan. Says that he would not complete because of his strong love for family and children.  Prior psychiatric medication trials: Lexapro      Visit Diagnosis:    ICD-10-CM   1. Generalized anxiety disorder  F41.1 Vilazodone HCl 20 MG TABS    ALPRAZolam (XANAX) 0.25 MG tablet    DISCONTINUED: Vilazodone HCl 20 MG TABS    2. Major depressive disorder, recurrent episode, moderate (HCC)  F33.1 Vilazodone HCl 20 MG TABS    DISCONTINUED: Vilazodone HCl 20 MG TABS      Past Psychiatric History:Gen Anx.  MDD,  Past Medical History:  Past Medical History:   Diagnosis Date   ADD (attention deficit disorder)    Alcohol abuse    Anxiety    Back pain    Bursitis of both knees    Degenerative disc disease, lumbar    Depression    Drug use    Fatty liver    Floppy eyelid syndrome    GERD (gastroesophageal reflux disease)    Hyperlipidemia    Hypertension    Obesity    Plantar fasciitis    followed by ortho   Sleep apnea    SOB (shortness of breath)     Past Surgical History:  Procedure Laterality Date   US ECHOCARDIOGRAPHY  08/2010   normal, no LVH    Family Psychiatric History: see chart  Family History:  Family History  Problem Relation Age of Onset   Anxiety disorder Mother    Alcohol abuse Mother    Diabetes Mother    Depression Mother    Sleep apnea Mother    Alcoholism Mother    Obesity Mother    ADD / ADHD Father    OCD Father    Hyperlipidemia Father    Anxiety disorder Father    Hypertension Maternal Grandfather    CAD Maternal Grandfather 86       MI, CABG   CAD Paternal Grandfather 37   Depression Paternal Grandmother    Anxiety disorder Paternal Grandmother    Colon  cancer Neg Hx    Prostate cancer Neg Hx     Social History:  Social History   Socioeconomic History   Marital status: Married    Spouse name: Judson Roch   Number of children: 2   Years of education: 18   Highest education level: Master's degree (e.g., MA, MS, MEng, MEd, MSW, MBA)  Occupational History   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    Married 2011   2 sons (born 2012 and 2015)   Social Determinants of Health   Financial Resource Strain: Not on file  Food Insecurity: Not on  file  Transportation Needs: Not on file  Physical Activity: Not on file  Stress: Not on file  Social Connections: Not on file    Allergies: No Known Allergies  Metabolic Disorder Labs: Lab Results  Component Value Date   HGBA1C 5.2 07/07/2021   MPG 103 07/07/2021   No results found for: PROLACTIN Lab Results  Component Value Date   CHOL 162 07/07/2021   TRIG 77 07/07/2021   HDL 59 07/07/2021   CHOLHDL 2.7 07/07/2021   VLDL 15 07/07/2021   LDLCALC 88 07/07/2021   LDLCALC 77 02/03/2021   Lab Results  Component Value Date   TSH 1.830 04/09/2019   TSH 2.23 08/31/2010    Therapeutic Level Labs: No results found for: LITHIUM No results found for: VALPROATE No components found for:  CBMZ  Current Medications: Current Outpatient Medications  Medication Sig Dispense Refill   aspirin EC 81 MG tablet Take 81 mg by mouth daily. Swallow whole.     atorvastatin (LIPITOR) 40 MG tablet Take 1 tablet (40 mg total) by mouth daily. 90 tablet 3   carvedilol (COREG) 3.125 MG tablet Take 1 tablet (3.125 mg total) by mouth 2 (two) times daily with a meal. 60 tablet 6   cyclobenzaprine (FLEXERIL) 10 MG tablet Take 10 mg by mouth as needed.     escitalopram (LEXAPRO) 20 MG tablet Take 1 tablet (20 mg total) by mouth daily. 90 tablet 3   tirzepatide (MOUNJARO) 12.5 MG/0.5ML Pen Inject 12.5 mg into the skin once a week. 2 mL 0   Vitamin D, Ergocalciferol, (DRISDOL) 1.25 MG (50000 UNIT) CAPS capsule Take 1 capsule (50,000 Units total) by mouth every 7 (seven) days. 12 capsule 0   ALPRAZolam (XANAX) 0.25 MG tablet Take 1 tablet (0.25 mg total) by mouth 2 (two) times daily as needed for anxiety. 20 tablet 0   Vilazodone HCl 20 MG TABS Take 1/2 tablet 10 mg daily for 7 days, then take one whole tablet 20 mg total daily. 30 tablet 1   No current facility-administered medications for this visit.    Medication Side Effects: none  Orders placed this visit:  No orders of the defined types were  placed in this encounter.   Psychiatric Specialty Exam:  Review of Systems  Constitutional: Negative.   Musculoskeletal:  Negative for gait problem.  Allergic/Immunologic: Negative.   Neurological:  Negative for tremors.   There were no vitals taken for this visit.There is no height or weight on file  to calculate BMI.  General Appearance: Casual, Neat, and Well Groomed  Eye Contact:  Good  Speech:  Clear and Coherent and Talkative  Volume:  Normal  Mood:  Anxious and Depressed  Affect:  Depressed and Anxious  Thought Process:  Coherent  Orientation:  Full (Time, Place, and Person)  Thought Content: Logical   Suicidal Thoughts:  No  Homicidal Thoughts:  No  Memory:  WNL  Judgement:  Good  Insight:  Good  Psychomotor Activity:  Normal  Concentration:  Concentration: Good  Recall:  Good  Fund of Knowledge: Good  Language: Good  Assets:  Desire for Improvement  ADL's:  Intact  Cognition: WNL  Prognosis:  Good   Screenings:  GAD-7    Flowsheet Row Office Visit from 07/24/2021 in [facility]  Total GAD-7 Score 10      PHQ2-9    [place] Visit from 07/24/2021 in [place] Visit from 04/09/2019 in [place] Office Visit from 05/18/2017 in [place] [facility]  PHQ-2 Total Score 3 6 0  PHQ-9 Total Score 12 22 --       Receiving Psychotherapy: No   Treatment Plan/Recommendations:   Greater than 50% of 60 min face to face time with patient was spent on counseling and coordination of care. We discussed his hx of anxiety and depression along with recent MI occurring in Jan. 2023 on skiing trip. He is currently under care of cardiologist. All recent labs reviewed. ECG notes reviewed. We discussed his prior tx for A&D and goals for tx here. We agreed to:  To reduced Lexapro to 10 mg for one week and the stop To start Viibryd 10 mg for 7 days, then 20 mg daily. Will report worsening  symptoms or side effects promptly To follow up in 4 weeks to reassess Provided emergency contact information Discussed potential benefits, risk, and side effects of benzodiazepines to include potential risk of tolerance and dependence, as well as possible drowsiness.  Advised patient not to drive if experiencing drowsiness and to take lowest possible effective dose to minimize risk of dependence and tolerance.   Reviewed PDMP         Elwanda [place], NP

## 2021-07-27 ENCOUNTER — Encounter: Payer: Self-pay | Admitting: Behavioral Health

## 2021-07-28 ENCOUNTER — Ambulatory Visit (INDEPENDENT_AMBULATORY_CARE_PROVIDER_SITE_OTHER): Payer: BC Managed Care – PPO | Admitting: Family Medicine

## 2021-07-28 ENCOUNTER — Encounter (HOSPITAL_COMMUNITY): Payer: Self-pay

## 2021-07-28 ENCOUNTER — Other Ambulatory Visit (HOSPITAL_COMMUNITY): Payer: Self-pay

## 2021-07-28 ENCOUNTER — Encounter (INDEPENDENT_AMBULATORY_CARE_PROVIDER_SITE_OTHER): Payer: Self-pay | Admitting: Family Medicine

## 2021-07-28 ENCOUNTER — Other Ambulatory Visit: Payer: Self-pay

## 2021-07-28 VITALS — BP 117/82 | HR 75 | Temp 97.9°F | Ht 68.0 in | Wt 211.0 lb

## 2021-07-28 DIAGNOSIS — E669 Obesity, unspecified: Secondary | ICD-10-CM

## 2021-07-28 DIAGNOSIS — R7303 Prediabetes: Secondary | ICD-10-CM

## 2021-07-28 DIAGNOSIS — E559 Vitamin D deficiency, unspecified: Secondary | ICD-10-CM

## 2021-07-28 DIAGNOSIS — I5181 Takotsubo syndrome: Secondary | ICD-10-CM

## 2021-07-28 DIAGNOSIS — E6609 Other obesity due to excess calories: Secondary | ICD-10-CM

## 2021-07-28 DIAGNOSIS — Z6832 Body mass index (BMI) 32.0-32.9, adult: Secondary | ICD-10-CM

## 2021-07-28 DIAGNOSIS — Z6838 Body mass index (BMI) 38.0-38.9, adult: Secondary | ICD-10-CM

## 2021-07-28 MED ORDER — MOUNJARO 15 MG/0.5ML ~~LOC~~ SOAJ
15.0000 mg | SUBCUTANEOUS | 0 refills | Status: DC
  Filled 2021-07-28: qty 2, 28d supply, fill #0

## 2021-07-28 MED ORDER — VITAMIN D (ERGOCALCIFEROL) 1.25 MG (50000 UNIT) PO CAPS
50000.0000 [IU] | ORAL_CAPSULE | ORAL | 0 refills | Status: DC
  Filled 2021-07-28: qty 12, 84d supply, fill #0

## 2021-07-28 NOTE — Progress Notes (Signed)
Chief Complaint:   OBESITY Alejandro [name] is here to discuss his progress with his obesity treatment plan along with follow-up of his obesity related diagnoses. Alejandro [name] is on following a lower carbohydrate, vegetable and lean protein rich diet plan and states he is following his eating plan approximately 75% of the time. Alejandro [name] states he is walking for 30-45 minutes 4 times per week.  Today's visit was #: 17 Starting weight: 245 lbs Starting date: 04/09/2019 Today's weight: 211 lbs Today's date: 07/28/2021 Total lbs lost to date: 34 lbs Total lbs lost since last in-office visit: 4 lbs  Interim History: Alejandro [name] had an Myocardial Infarction January 19th and was hospitalized for 3 days. He has had significant stress due to loss of job 1 week before Christmas. He has been seen by [facility] (07-24-20). He was prescribed Vilazodone 20 mg. He is motivated more than ever to lose weight. His cardiologist wants him to lose to 185 lbs.   Subjective:   1. Takotsubo syndrome Alejandro [name] had Myocardial Infarction January 19 th. Cath did not show blockage.  His cardiology recommended he lose to around 185 lbs. He has follow-up appointment tomorrow.  2. Vitamin D deficiency Alejandro [name]'s Vitamin D is not at goal of 50. He is on weekly prescription Vitamin D.  Lab Results  Component Value Date   VD25OH 33.5 02/03/2021   VD25OH 24.8 (L) 06/04/2020   VD25OH 58.2 12/10/2019    3. Pre-diabetes Alejandro [name]'s last A1C was 5.2. He has been on Mounjaro 12.5 mg weekly.   Lab Results  Component Value Date   HGBA1C 5.2 07/07/2021   Lab Results  Component Value Date   INSULIN 25.5 (H) 02/03/2021   INSULIN 14.2 06/04/2020   INSULIN 7.2 12/10/2019   INSULIN 32.3 (H) 07/31/2019   INSULIN 26.6 (H) 04/09/2019    Assessment/Plan:   1. Takotsubo syndrome [place] will follow up with cardiology as directed. He has an appointment tomorrow with Dr. Loralie Champagne.   2. Vitamin D deficiency We will refill prescription  Vitamin D 50,000 IU weekly and Andrez will follow-up for routine testing of Vitamin D, at least 2-3 times per year to avoid over-replacement.  - Vitamin D, Ergocalciferol, (DRISDOL) 1.25 MG (50000 UNIT) CAPS capsule; Take 1 capsule (50,000 Units total) by mouth every 7 (seven) days.  Dispense: 12 capsule; Refill: 0  3. Pre-diabetes Alejandro [name] agrees to increase dose to 15 mg Mounjaro weekly.  - tirzepatide (MOUNJARO) 15 MG/0.5ML Pen; Inject 15 mg into the skin once a week.  Dispense: 2 mL; Refill: 0  4. Obesity: Current BMI 32.09 Alejandro [name] is currently in the action stage of change. As such, his goal is to continue with weight loss efforts. He has agreed to following a lower carbohydrate, vegetable and lean protein rich diet plan.   Exercise goals:  As is.  Behavioral modification strategies: increasing lean protein intake.  Alejandro [name] has agreed to follow-up with our clinic in 4 weeks. Alejandro [name] wants to alternate virtual visits and in office visits. Objective:   Blood pressure 117/82, pulse 75, temperature 97.9 F (36.6 C), height 5\' 8"  (1.727 m), weight 211 lb (95.7 kg), SpO2 98 %. Body mass index is 32.08 kg/m.  General: Cooperative, alert, well developed, in no acute distress. HEENT: Conjunctivae and lids unremarkable. Cardiovascular: Regular rhythm.  Lungs: Normal work of breathing. Neurologic: No focal deficits.   Lab Results  Component Value Date   CREATININE 0.89 07/07/2021   BUN 11 07/07/2021   NA 136 07/07/2021   K  4.3 07/07/2021   CL 103 07/07/2021   CO2 24 07/07/2021   Lab Results  Component Value Date   ALT 26 07/07/2021   AST 21 07/07/2021   ALKPHOS 46 07/07/2021   BILITOT 0.7 07/07/2021   Lab Results  Component Value Date   HGBA1C 5.2 07/07/2021   HGBA1C 5.8 (H) 02/03/2021   HGBA1C 5.3 06/04/2020   HGBA1C 5.2 12/10/2019   HGBA1C 5.7 (H) 07/31/2019   Lab Results  Component Value Date   INSULIN 25.5 (H) 02/03/2021   INSULIN 14.2 06/04/2020   INSULIN 7.2  12/10/2019   INSULIN 32.3 (H) 07/31/2019   INSULIN 26.6 (H) 04/09/2019   Lab Results  Component Value Date   TSH 1.830 04/09/2019   Lab Results  Component Value Date   CHOL 162 07/07/2021   HDL 59 07/07/2021   LDLCALC 88 07/07/2021   LDLDIRECT 224.0 08/26/2017   TRIG 77 07/07/2021   CHOLHDL 2.7 07/07/2021   Lab Results  Component Value Date   VD25OH 33.5 02/03/2021   VD25OH 24.8 (L) 06/04/2020   VD25OH 58.2 12/10/2019   Lab Results  Component Value Date   WBC 6.4 07/07/2021   HGB 17.0 07/07/2021   HCT 49.6 07/07/2021   MCV 86.6 07/07/2021   PLT 494 (H) 07/07/2021   No results found for: IRON, TIBC, FERRITIN  Attestation Statements:   Reviewed by clinician on day of visit: allergies, medications, problem list, medical history, surgical history, family history, social history, and previous encounter notes.  I, Lizbeth Bark, RMA, am acting as Location manager for [employer], [place].  I have reviewed the above documentation for accuracy and completeness, and I agree with the above. -  Georgianne Fick, FNP

## 2021-07-29 ENCOUNTER — Ambulatory Visit (HOSPITAL_COMMUNITY)
Admission: RE | Admit: 2021-07-29 | Discharge: 2021-07-29 | Disposition: A | Discharge status: Home / Self Care | Payer: BC Managed Care – PPO | Source: Ambulatory Visit | Attending: Cardiology | Admitting: Cardiology

## 2021-07-29 ENCOUNTER — Encounter (HOSPITAL_COMMUNITY): Payer: Self-pay | Admitting: Cardiology

## 2021-07-29 VITALS — BP 104/60 | HR 74 | Wt 218.6 lb

## 2021-07-29 DIAGNOSIS — Z79899 Other long term (current) drug therapy: Secondary | ICD-10-CM | POA: Insufficient documentation

## 2021-07-29 DIAGNOSIS — F419 Anxiety disorder, unspecified: Secondary | ICD-10-CM | POA: Insufficient documentation

## 2021-07-29 DIAGNOSIS — F32A Depression, unspecified: Secondary | ICD-10-CM | POA: Insufficient documentation

## 2021-07-29 DIAGNOSIS — E663 Overweight: Secondary | ICD-10-CM | POA: Diagnosis not present

## 2021-07-29 DIAGNOSIS — Z8249 Family history of ischemic heart disease and other diseases of the circulatory system: Secondary | ICD-10-CM | POA: Insufficient documentation

## 2021-07-29 DIAGNOSIS — I5181 Takotsubo syndrome: Secondary | ICD-10-CM | POA: Diagnosis not present

## 2021-07-29 DIAGNOSIS — I214 Non-ST elevation (NSTEMI) myocardial infarction: Secondary | ICD-10-CM | POA: Insufficient documentation

## 2021-07-29 DIAGNOSIS — E785 Hyperlipidemia, unspecified: Secondary | ICD-10-CM | POA: Insufficient documentation

## 2021-07-29 DIAGNOSIS — Z7982 Long term (current) use of aspirin: Secondary | ICD-10-CM | POA: Insufficient documentation

## 2021-07-29 NOTE — Progress Notes (Signed)
PCP: Tonia Ghent, MD Cardiology: Dr. Aundra Dubin  41 y.o. 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  Patient had no prior cardiac history.  He has been on atorvastatin for elevated cholesterol and has been on tirzepatide to aid with weight loss.  He lost his job right before Christmas 2022 and has been under a lot of stress.  He began to note episodes of chest pain after this, mild squeezing in the central chest with stress.  He went skiing with his kids in 1/23.  At the top of a slope, he developed severe substernal chest tightness that did not relent.  He was taken to the hospital in [place] by EMS, ECG abnormalities were noted.  Troponin was mildly elevated. CTA chest showed no PE or dissection.  He had LHC, showing no significant coronary disease but there was apical dyskinesis and overall EF 60%.  This was thought to be consistent with stress (Takotsubo-type) cardiomyopathy.  He has a family history of CAD/MI, no h/o cardiomyopathy.  No recent viral illness or fever.  He does not smoke or drink ETOH.   Echo was done in 1/23.  This showed EF 55-60%, severe hypokinesis in the mid septum.  Cardiac MRI in 2/23 showed LV EF 55% with mild hypokinesis in the mid septum, normal RV, no LGE.   He is doing better.  No longer having chest pain.  Walks his dog for 30 minutes a day, no exertional dyspnea.  Mild shortness of breath walking up steep hill.    ECG (personally reviewed): NSR, inferior and anterolateral TWIs  Labs (8/22): LDL 77, HDL 86 Labs (1/23): K 4, creatinine 0.91, plts 442, LDL 88  PMH: 1. Hyperlipidemia 2. Depression/anxiety 3. Pre-diabetes 4. H/o ruptured disc in back. 5. Stress (Takotsubo-type) cardiomyopathy: NSTEMI in 1/23 while skiing.   - LHC (1/23): No significant coronary disease, apical dyskinesis on LV-gram with EF 60%.  - Echo (1/23): EF 55-60%, severe hypokinesis in the mid septum -  Cardiac MRI (2/23): LV EF 55% with mild hypokinesis in the mid septum, normal RV, no LGE.   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  No smoking, no ETOH.   FH: Grandfather with MI in his 23s, other grandfather with MI in his 32s.   ROS: All systems reviewed and negative except as per HPI.   Current Outpatient Medications  Medication Sig Dispense Refill   ALPRAZolam (XANAX) 0.25 MG tablet Take 1 tablet (0.25 mg total) by mouth 2 (two) times daily as needed for anxiety. 20 tablet 0   aspirin EC 81 MG tablet Take 81 mg by mouth daily. Swallow whole.     atorvastatin (LIPITOR) 40 MG tablet Take 1 tablet (40 mg total) by mouth daily. 90 tablet 3   carvedilol (COREG) 3.125 MG tablet Take 1 tablet (3.125 mg total) by mouth 2 (two) times daily with a meal. 60 tablet 6   cyclobenzaprine (FLEXERIL) 10 MG tablet Take 10 mg by mouth as needed.     escitalopram (LEXAPRO) 20 MG tablet Take 1 tablet (20 mg total) by mouth daily. 90 tablet 3   tirzepatide (MOUNJARO) 15 MG/0.5ML Pen Inject 15 mg into the skin once a week. 2 mL 0   Vilazodone HCl 20 MG TABS Take 1/2 tablet 10 mg daily for 7 days, then take one whole tablet 20 mg total daily. 30 tablet 1   Vitamin D, Ergocalciferol, (DRISDOL) 1.25 MG (50000 UNIT) CAPS capsule Take  1 capsule (50,000 Units total) by mouth every 7 (seven) days. 12 capsule 0   No current facility-administered medications for this encounter.   Blood pressure 104/60, pulse 74, weight 99.2 kg (218 lb 9.6 oz), SpO2 97 %. General: NAD Neck: No JVD, no thyromegaly or thyroid nodule.  Lungs: Clear to auscultation bilaterally with normal respiratory effort. CV: Nondisplaced PMI.  Heart regular S1/S2, no S3/S4, no murmur.  No peripheral edema.  No carotid bruit.  Normal pedal pulses.  Abdomen: Soft, nontender, no hepatosplenomegaly, no distention.  Skin: Intact without lesions or rashes.  Neurologic: Alert and oriented x 3.  Psych: Normal affect. Extremities: No  clubbing or cyanosis.  HEENT: Normal.   Assessment/Plan: 1. Cardiomyopathy: Patient presented with NSTEMI in 1/23, cath showed no significant coronary disease with LV-gram showing EF 60% but apical dyskinesis.  Given significant stressors and anxiety, this episode most likely represents a stress (Takotsubo-type) cardiomyopathy.  No viral prodrome to suggest viral myocarditis.  Echo in 1/23 showed LV EF 55-60% with severe hypokinesis in the mid septum, which is an unusual pattern for Takotsubo.  Cardiac MRI done in 2/23 showed EF 59% with very mild mid septal hypokinesis.  There was no delayed enhancement, so improving wall motion abnormality + lack of scar does suggest a stress cardiomyopathy. He is euvolemic on exam, NYHA class I symptoms.  I would expect LV function to recover completely with likely stress cardiomyopathy.   - Repeat echo in 2 months to look for recovery.  - Continue Coreg 3.125 mg bid.   - Continue ASA 81 daily.  - Recurrence is possible, he needs to work on stress management/anxiety control.  2. Hyperlipidemia: Given strong family history of CAD (GF with MI in early 52s), would like to see LDL < 70. Atorvastatin recently increased.  - Lipids/LFTs in 2 months.  3. Depression/anxiety: Working with a Engineer, water.  4. Overweight: [facility] and is taking terzepatide.   Followup in 2 months with echo.   Loralie Champagne 07/29/2021

## 2021-07-29 NOTE — Patient Instructions (Signed)
Thank you for your visit today.  There has been no changes to your medications.  Your physician has requested that you have an echocardiogram. Echocardiography is a painless test that uses sound waves to create images of your heart. It provides your doctor with information about the size and shape of your heart and how well your hearts chambers and valves are working. This procedure takes approximately one hour. There are no restrictions for this procedure.  Your physician recommends that you schedule a follow-up appointment in: 2 months.  If you have any questions or concerns before your next appointment please send [place] a message through [place] or call our office at [phone].    TO LEAVE A MESSAGE FOR THE NURSE SELECT OPTION 2, PLEASE LEAVE A MESSAGE INCLUDING: YOUR NAME DATE OF BIRTH CALL BACK NUMBER REASON FOR CALL**this is important as we prioritize the call backs  YOU WILL RECEIVE A CALL BACK THE SAME DAY AS LONG AS YOU CALL BEFORE 4:00 PM  At the [facility], you and your health needs are our priority. As part of our continuing mission to provide you with exceptional heart care, we have created designated Provider Care Teams. These Care Teams include your primary Cardiologist (physician) and Advanced Practice Providers (APPs- Physician Assistants and Nurse Practitioners) who all work together to provide you with the care you need, when you need it.   You may see any of the following providers on your designated Care Team at your next follow up: Dr Glori Bickers Dr Haynes Kerns, NP Lyda Jester, [place] [facility] [place], [place] Audry Riles, PharmD   Please be sure to bring in all your medications bottles to every appointment.

## 2021-08-18 ENCOUNTER — Ambulatory Visit: Payer: BC Managed Care – PPO | Admitting: Psychiatry

## 2021-08-21 ENCOUNTER — Other Ambulatory Visit: Payer: Self-pay

## 2021-08-21 ENCOUNTER — Telehealth: Payer: Self-pay | Admitting: Behavioral Health

## 2021-08-21 ENCOUNTER — Encounter: Payer: Self-pay | Admitting: Behavioral Health

## 2021-08-21 ENCOUNTER — Ambulatory Visit (INDEPENDENT_AMBULATORY_CARE_PROVIDER_SITE_OTHER): Payer: Commercial Managed Care - PPO | Admitting: Behavioral Health

## 2021-08-21 DIAGNOSIS — F411 Generalized anxiety disorder: Secondary | ICD-10-CM

## 2021-08-21 DIAGNOSIS — F99 Mental disorder, not otherwise specified: Secondary | ICD-10-CM

## 2021-08-21 DIAGNOSIS — F5105 Insomnia due to other mental disorder: Secondary | ICD-10-CM

## 2021-08-21 DIAGNOSIS — F331 Major depressive disorder, recurrent, moderate: Secondary | ICD-10-CM | POA: Diagnosis not present

## 2021-08-21 MED ORDER — TRAZODONE HCL 50 MG PO TABS: 50.0000 mg | ORAL_TABLET | Freq: Every day | ORAL | 3 refills | Status: DC

## 2021-08-21 MED ORDER — VILAZODONE HCL 40 MG PO TABS: 40.0000 mg | ORAL_TABLET | Freq: Every day | ORAL | 2 refills | Status: DC

## 2021-08-21 NOTE — Telephone Encounter (Signed)
See message from the pharmacy. Please advise.  ?

## 2021-08-21 NOTE — Telephone Encounter (Signed)
Alejandro [name] has already told me about this. Please tell them I understand the effects of these medication and to fill as prescribed.

## 2021-08-21 NOTE — Telephone Encounter (Signed)
Next appt is 09/17/21. [facility] called regarding Alejandro [name]'s medication. They state he has an increased risk of interaction with Trazodone and Vilazadone with serotonin syndrome and duplication of therapy.  ? ?[facility] [address] [place], [place]. AT [place] ? ?Phone:  [phone]  ?Fax:  [phone]  ? ? ? ? ? ?

## 2021-08-21 NOTE — Progress Notes (Signed)
[facility] ? ?Patient ID: Alejandro [name],  ?MRN: [id number] ? ?PCP: Tonia Ghent, MD ? ?Date of Evaluation: 08/21/2021 ?Time spent:30 minutes ? ?Chief Complaint:  ?Chief Complaint   ?Anxiety; Depression; Medication Refill; Medication Problem; Follow-up ?  ? ? ?HISTORY/CURRENT STATUS: ?HPI ? ?41 year old male presents to this office for follow up and medication management. Says that he feels like medication has worked very well but still needs a little more work. He says that he has improved about 50% since last visit. Still having some occasional depressive episodes but becoming more manageable. He is not working yet but is being more social. He gets out for coffee with supportive friends and things are progressing well.   Says his anxiety is 5/10 today and his depression is 3/10. He is sleeping 7-8 hours per night.  He denies mania, no psychosis. He denies any SI/HI.  ?  ?Prior psychiatric medication trials: ?Lexapro ? ? ? ?Individual Medical History/ Review of Systems: Changes? :No  ? ?Allergies: Patient has no known allergies. ? ?Current Medications:  ?Current Outpatient Medications:  ?  traZODone (DESYREL) 50 MG tablet, Take 1 tablet (50 mg total) by mouth at bedtime., Disp: 30 tablet, Rfl: 3 ?  Vilazodone HCl (VIIBRYD) 40 MG TABS, Take 1 tablet (40 mg total) by mouth daily., Disp: 30 tablet, Rfl: 2 ?  ALPRAZolam (XANAX) 0.25 MG tablet, Take 1 tablet (0.25 mg total) by mouth 2 (two) times daily as needed for anxiety., Disp: 20 tablet, Rfl: 0 ?  aspirin EC 81 MG tablet, Take 81 mg by mouth daily. Swallow whole., Disp: , Rfl:  ?  atorvastatin (LIPITOR) 40 MG tablet, Take 1 tablet (40 mg total) by mouth daily., Disp: 90 tablet, Rfl: 3 ?  carvedilol (COREG) 3.125 MG tablet, Take 1 tablet (3.125 mg total) by mouth 2 (two) times daily with a meal., Disp: 60 tablet, Rfl: 6 ?  cyclobenzaprine (FLEXERIL) 10 MG tablet, Take 10 mg by mouth as needed., Disp: , Rfl:  ?  escitalopram (LEXAPRO) 20 MG tablet,  Take 1 tablet (20 mg total) by mouth daily., Disp: 90 tablet, Rfl: 3 ?  tirzepatide (MOUNJARO) 15 MG/0.5ML Pen, Inject 15 mg into the skin once a week., Disp: 2 mL, Rfl: 0 ?  Vilazodone HCl 20 MG TABS, Take 1/2 tablet 10 mg daily for 7 days, then take one whole tablet 20 mg total daily., Disp: 30 tablet, Rfl: 1 ?  Vitamin D, Ergocalciferol, (DRISDOL) 1.25 MG (50000 UNIT) CAPS capsule, Take 1 capsule (50,000 Units total) by mouth every 7 (seven) days., Disp: 12 capsule, Rfl: 0 ?Medication Side Effects: none ? ?Family Medical/ Social History: Changes? No ? ?MENTAL HEALTH EXAM: ? ?There were no vitals taken for this visit.There is no height or weight on file to calculate BMI.  ?General Appearance: Casual, Neat, and Well Groomed  ?Eye Contact:  Good  ?Speech:  Clear and Coherent and Talkative  ?Volume:  Normal  ?Mood:  Anxious and Depressed  ?Affect:  Depressed and Anxious  ?Thought Process:  Coherent  ?Orientation:  Full (Time, Place, and Person)  ?Thought Content: Logical   ?Suicidal Thoughts:  No  ?Homicidal Thoughts:  No  ?Memory:  WNL  ?Judgement:  Good  ?Insight:  Good  ?Psychomotor Activity:  Normal  ?Concentration:  Concentration: Good  ?Recall:  Good  ?Fund of Knowledge: Good  ?Language: Good  ?Assets:  Desire for Improvement  ?ADL's:  Intact  ?Cognition: WNL  ?Prognosis:  Good  ? ? ?  DIAGNOSES:  ?  ICD-10-CM   ?1. Generalized anxiety disorder  F41.1 Vilazodone HCl (VIIBRYD) 40 MG TABS  ?  ?2. Major depressive disorder, recurrent episode, moderate (HCC)  F33.1 Vilazodone HCl (VIIBRYD) 40 MG TABS  ?  ?3. Insomnia due to other mental disorder  F51.05 traZODone (DESYREL) 50 MG tablet  ? F99   ?  ? ? ?Receiving Psychotherapy: No  ? ? ?RECOMMENDATIONS:  ? ?Greater than 50% of 30 min face to face time with patient was spent on counseling and coordination of care. We discussed his significant improvement  with anxiety and depression but feels he is not at the level he would like. He is requesting dosage increase this  visit.  ?We agreed to: ?Increase Viibryd to 40 mg daily ?Will report worsening symptoms or side effects promptly ?To follow up in 4 weeks to reassess ?Provided emergency contact information ?Discussed potential benefits, risk, and side effects of benzodiazepines to include potential risk of tolerance and dependence, as well as possible drowsiness.  Advised patient not to drive if experiencing drowsiness and to take lowest possible effective dose to minimize risk of dependence and tolerance.   ?Reviewed PDMP  ? ? ? ? ?  ?  ?No prior medication trial ? ? ? ? ? ? ? ?Elwanda Brooklyn, NP  ?

## 2021-08-21 NOTE — Telephone Encounter (Signed)
Pharmacist notified.

## 2021-08-24 NOTE — Progress Notes (Unsigned)
TeleHealth Visit:  Due to the COVID-19 pandemic, this visit was completed with telemedicine (audio/video) technology to reduce patient and provider exposure as well as to preserve personal protective equipment.   Alejandro [name] has verbally consented to this TeleHealth visit. The patient is located at home, the provider is located at home. The participants in this visit include the listed provider and patient. The visit was conducted today via MyChart video.  OBESITY Alejandro [name] is here to discuss his progress with his obesity treatment plan along with follow-up of his obesity related diagnoses.   Today's date: 08/24/2021 Today's visit was # 87 Starting weight: 245 lbs Starting date: 04/09/2019 Total weight loss: *** lbs Weight at last in office visit: 211 lbs Today's reported weight: *** lbs No weight reported. Weight change since last visit: ***  Today's visit was #: 57 Starting weight: 245 lbs Starting date: 04/09/2019 Today's weight: 211 lbs Today's date: 07/28/2021 Total lbs lost to date: 34 lbs Total lbs lost since last in-office visit: 4 lbs  Interim History: ***  Nutrition Plan: following a lower carbohydrate, vegetable and lean protein rich diet plan.  Hunger is {EWCONTROLASSESSMENT:24261}. Cravings are {EWCONTROLASSESSMENT:24261}.  Current exercise: {exercise types:16438} Assessment/Plan:  *** Obesity: Current BMI *** Alejandro [name] {CHL AMB IS/IS NOT:210130109} currently in the action stage of change. As such, his goal is to {MWMwtloss#1:210800005}. He has agreed to {MWMwtlossportion/plan2:23431}.   Exercise goals: {MWM EXERCISE RECS:23473} Continue exercise: {exercise types:16438}  Behavioral modification strategies: {MWMwtlossdietstrategies3:23432}.  Waino has agreed to follow-up with our clinic in {NUMBER 1-10:22536} weeks.   No orders of the defined types were placed in this encounter.   There are no discontinued medications.   No orders of the defined types  were placed in this encounter.     Objective:   VITALS: Per patient if applicable, see vitals. GENERAL: Alert and in no acute distress. CARDIOPULMONARY: No increased WOB. Speaking in clear sentences.  PSYCH: Pleasant and cooperative. Speech normal rate and rhythm. Affect is appropriate. Insight and judgement are appropriate. Attention is focused, linear, and appropriate.  NEURO: Oriented as arrived to appointment on time with no prompting.   Lab Results  Component Value Date   CREATININE 0.89 07/07/2021   BUN 11 07/07/2021   NA 136 07/07/2021   K 4.3 07/07/2021   CL 103 07/07/2021   CO2 24 07/07/2021   Lab Results  Component Value Date   ALT 26 07/07/2021   AST 21 07/07/2021   ALKPHOS 46 07/07/2021   BILITOT 0.7 07/07/2021   Lab Results  Component Value Date   HGBA1C 5.2 07/07/2021   HGBA1C 5.8 (H) 02/03/2021   HGBA1C 5.3 06/04/2020   HGBA1C 5.2 12/10/2019   HGBA1C 5.7 (H) 07/31/2019   Lab Results  Component Value Date   INSULIN 25.5 (H) 02/03/2021   INSULIN 14.2 06/04/2020   INSULIN 7.2 12/10/2019   INSULIN 32.3 (H) 07/31/2019   INSULIN 26.6 (H) 04/09/2019   Lab Results  Component Value Date   TSH 1.830 04/09/2019   Lab Results  Component Value Date   CHOL 162 07/07/2021   HDL 59 07/07/2021   LDLCALC 88 07/07/2021   LDLDIRECT 224.0 08/26/2017   TRIG 77 07/07/2021   CHOLHDL 2.7 07/07/2021   Lab Results  Component Value Date   WBC 6.4 07/07/2021   HGB 17.0 07/07/2021   HCT 49.6 07/07/2021   MCV 86.6 07/07/2021   PLT 494 (H) 07/07/2021   No results found for: IRON, TIBC, FERRITIN Lab Results  Component Value Date  VD25OH 33.5 02/03/2021   VD25OH 24.8 (L) 06/04/2020   VD25OH 58.2 12/10/2019    Attestation Statements:   Reviewed by clinician on day of visit: allergies, medications, problem list, medical history, surgical history, family history, social history, and previous encounter notes.  ***(delete if time-based billing not used)Time spent  on visit including pre-visit chart review and post-visit charting and care was *** minutes.

## 2021-08-25 ENCOUNTER — Other Ambulatory Visit (HOSPITAL_COMMUNITY): Payer: Self-pay

## 2021-08-25 ENCOUNTER — Encounter (INDEPENDENT_AMBULATORY_CARE_PROVIDER_SITE_OTHER): Payer: Self-pay | Admitting: Family Medicine

## 2021-08-25 ENCOUNTER — Telehealth (INDEPENDENT_AMBULATORY_CARE_PROVIDER_SITE_OTHER): Payer: Commercial Managed Care - PPO | Admitting: Family Medicine

## 2021-08-25 DIAGNOSIS — R7303 Prediabetes: Secondary | ICD-10-CM

## 2021-08-25 DIAGNOSIS — Z6832 Body mass index (BMI) 32.0-32.9, adult: Secondary | ICD-10-CM

## 2021-08-25 DIAGNOSIS — E669 Obesity, unspecified: Secondary | ICD-10-CM | POA: Diagnosis not present

## 2021-08-25 DIAGNOSIS — E6609 Other obesity due to excess calories: Secondary | ICD-10-CM

## 2021-08-25 DIAGNOSIS — F32A Depression, unspecified: Secondary | ICD-10-CM | POA: Diagnosis not present

## 2021-08-25 DIAGNOSIS — F419 Anxiety disorder, unspecified: Secondary | ICD-10-CM | POA: Diagnosis not present

## 2021-08-25 MED ORDER — MOUNJARO 15 MG/0.5ML ~~LOC~~ SOAJ
15.0000 mg | SUBCUTANEOUS | 0 refills | Status: DC
  Filled 2021-08-25 (×2): qty 2, 28d supply, fill #0

## 2021-09-15 ENCOUNTER — Ambulatory Visit (HOSPITAL_COMMUNITY)
Admission: RE | Admit: 2021-09-15 | Discharge: 2021-09-15 | Disposition: A | Discharge status: Home / Self Care | Payer: Commercial Managed Care - PPO | Source: Ambulatory Visit | Attending: Cardiology | Admitting: Cardiology

## 2021-09-15 ENCOUNTER — Other Ambulatory Visit (HOSPITAL_COMMUNITY): Payer: Self-pay

## 2021-09-15 ENCOUNTER — Encounter (INDEPENDENT_AMBULATORY_CARE_PROVIDER_SITE_OTHER): Payer: Self-pay | Admitting: Physician Assistant

## 2021-09-15 ENCOUNTER — Ambulatory Visit (INDEPENDENT_AMBULATORY_CARE_PROVIDER_SITE_OTHER): Payer: Commercial Managed Care - PPO | Admitting: Physician Assistant

## 2021-09-15 VITALS — BP 115/78 | HR 73 | Temp 97.6°F | Ht 68.0 in | Wt 206.0 lb

## 2021-09-15 DIAGNOSIS — R7303 Prediabetes: Secondary | ICD-10-CM

## 2021-09-15 DIAGNOSIS — E669 Obesity, unspecified: Secondary | ICD-10-CM

## 2021-09-15 DIAGNOSIS — Z6831 Body mass index (BMI) 31.0-31.9, adult: Secondary | ICD-10-CM | POA: Diagnosis not present

## 2021-09-15 DIAGNOSIS — Z9189 Other specified personal risk factors, not elsewhere classified: Secondary | ICD-10-CM

## 2021-09-15 DIAGNOSIS — E7849 Other hyperlipidemia: Secondary | ICD-10-CM | POA: Diagnosis present

## 2021-09-15 DIAGNOSIS — E6609 Other obesity due to excess calories: Secondary | ICD-10-CM

## 2021-09-15 LAB — HEPATIC FUNCTION PANEL
ALT: 36 U/L (ref 0–44)
AST: 22 U/L (ref 15–41)
Albumin: 4.3 g/dL (ref 3.5–5.0)
Alkaline Phosphatase: 47 U/L (ref 38–126)
Bilirubin, Direct: 0.2 mg/dL (ref 0.0–0.2)
Indirect Bilirubin: 0.4 mg/dL (ref 0.3–0.9)
Total Bilirubin: 0.6 mg/dL (ref 0.3–1.2)
Total Protein: 7.2 g/dL (ref 6.5–8.1)

## 2021-09-15 LAB — LIPID PANEL
Cholesterol: 173 mg/dL (ref 0–200)
HDL: 59 mg/dL (ref >40)
LDL Cholesterol: 93 mg/dL (ref 0–99)
Total CHOL/HDL Ratio: 2.9 RATIO
Triglycerides: 104 mg/dL (ref <150)
VLDL: 21 mg/dL (ref 0–40)

## 2021-09-15 MED ORDER — MOUNJARO 15 MG/0.5ML ~~LOC~~ SOAJ
15.0000 mg | SUBCUTANEOUS | 0 refills | Status: DC
  Filled 2021-09-15: qty 2, 28d supply, fill #0

## 2021-09-16 ENCOUNTER — Other Ambulatory Visit (HOSPITAL_COMMUNITY): Payer: Self-pay

## 2021-09-16 NOTE — Progress Notes (Signed)
? ? ? ?Chief Complaint:  ? ?OBESITY ?Alejandro [name] is here to discuss his progress with his obesity treatment plan along with follow-up of his obesity related diagnoses. Alejandro [name] is on following a lower carbohydrate, vegetable and lean protein rich diet plan and states he is following his eating plan approximately 60% of the time. Alejandro [name] states he is doing, HIIT and walking 30-40 minutes 4 times per week. ? ?Today's visit was #: 78 ?Starting weight: 245 lbs ?Starting date: 04/09/2019   ?Today's weight: 206 lbs ?Today's date: 09/15/2021 ?Total lbs lost to date: 29 ?Total lbs lost since last in-office visit: 5 ? ?Interim History: Alejandro [name] is feeling good physically and has been working out more often. He is traveling with his son's baseball team often, and sometimes struggles to stay on plan. ? ?Subjective:  ? ?1. Pre-diabetes ?Alejandro [name] is currently taking Moujaro '15mg'$  weekly. His appetite is controlled. ? ?2. At risk for hypoglycemia ?Alejandro [name] is at increased risk for hypoglycemia due to changes in diet, diagnosis of diabetes, and/or insulin use. Alejandro [name] is currently taking insulin.  ? ? ?Assessment/Plan:  ? ?1. Pre-diabetes ?We will refill Moujaro today for 3 months with no refills. ? ?- Refill tirzepatide (MOUNJARO) 15 MG/0.5ML Pen; Inject 15 mg into the skin once a week.  Dispense: 6 mL; Refill: 0 ? ?2. At risk for hypoglycemia ?Alejandro [name] was given approximately 15 minutes of counseling today regarding prevention of hypoglycemia. He was advised of symptoms of hypoglycemia. Alejandro [name] was instructed to avoid skipping meals, eat regular protein rich meals, and schedule low calorie snacks as needed. ? ?Repetitive spaced learning was employed today to elicit superior memory formation and behavioral change. ? ? ?3. Obesity: Current BMI 31.4 ?Alejandro [name] is currently in the action stage of change. As such, his goal is to continue with weight loss efforts. He has agreed to following a lower carbohydrate, vegetable and lean protein rich  diet plan.  ? ?Exercise goals: As is. ? ?Behavioral modification strategies: meal planning and cooking strategies and keeping healthy foods in the home. ? ?Alejandro [name] has agreed to follow-up with our clinic in 3 weeks. He was informed of the importance of frequent follow-up visits to maximize his success with intensive lifestyle modifications for his multiple health conditions.  ? ?Objective:  ? ?Blood pressure 115/78, pulse 73, temperature 97.6 ?F (36.4 ?C), height '5\' 8"'$  (1.727 m), weight 206 lb (93.4 kg), SpO2 97 %. ?Body mass index is 31.32 kg/m?. ? ?General: Cooperative, alert, well developed, in no acute distress. ?HEENT: Conjunctivae and lids unremarkable. ?Cardiovascular: Regular rhythm.  ?Lungs: Normal work of breathing. ?Neurologic: No focal deficits.  ? ?Lab Results  ?Component Value Date  ? CREATININE 0.89 07/07/2021  ? BUN 11 07/07/2021  ? NA 136 07/07/2021  ? K 4.3 07/07/2021  ? CL 103 07/07/2021  ? CO2 24 07/07/2021  ? ?Lab Results  ?Component Value Date  ? ALT 36 09/15/2021  ? AST 22 09/15/2021  ? ALKPHOS 47 09/15/2021  ? BILITOT 0.6 09/15/2021  ? ?Lab Results  ?Component Value Date  ? HGBA1C 5.2 07/07/2021  ? HGBA1C 5.8 (H) 02/03/2021  ? HGBA1C 5.3 06/04/2020  ? HGBA1C 5.2 12/10/2019  ? HGBA1C 5.7 (H) 07/31/2019  ? ?Lab Results  ?Component Value Date  ? INSULIN 25.5 (H) 02/03/2021  ? INSULIN 14.2 06/04/2020  ? INSULIN 7.2 12/10/2019  ? INSULIN 32.3 (H) 07/31/2019  ? INSULIN 26.6 (H) 04/09/2019  ? ?Lab Results  ?Component Value Date  ? TSH 1.830 04/09/2019  ? ?Lab Results  ?  Component Value Date  ? CHOL 173 09/15/2021  ? HDL 59 09/15/2021  ? [place] 93 09/15/2021  ? LDLDIRECT 224.0 08/26/2017  ? TRIG 104 09/15/2021  ? CHOLHDL 2.9 09/15/2021  ? ?Lab Results  ?Component Value Date  ? VD25OH 33.5 02/03/2021  ? VD25OH 24.8 (L) 06/04/2020  ? VD25OH 58.2 12/10/2019  ? ?Lab Results  ?Component Value Date  ? WBC 6.4 07/07/2021  ? HGB 17.0 07/07/2021  ? HCT 49.6 07/07/2021  ? MCV 86.6 07/07/2021  ? PLT 494 (H)  07/07/2021  ? ?No results found for: IRON, TIBC, FERRITIN ? ?Attestation Statements:  ? ?Reviewed by clinician on day of visit: allergies, medications, problem list, medical history, surgical history, family history, social history, and previous encounter notes. ? ?I, Brendell Tyus, am acting as transcriptionist for [employer], PA-C. ? ?I have reviewed the above documentation for accuracy and completeness, and I agree with the above. Abby Potash, PA-C ? ?

## 2021-09-17 ENCOUNTER — Encounter: Payer: Self-pay | Admitting: Behavioral Health

## 2021-09-17 ENCOUNTER — Other Ambulatory Visit (HOSPITAL_COMMUNITY): Payer: Self-pay

## 2021-09-17 ENCOUNTER — Ambulatory Visit (INDEPENDENT_AMBULATORY_CARE_PROVIDER_SITE_OTHER): Payer: Commercial Managed Care - PPO | Admitting: Behavioral Health

## 2021-09-17 DIAGNOSIS — F99 Mental disorder, not otherwise specified: Secondary | ICD-10-CM

## 2021-09-17 DIAGNOSIS — F5105 Insomnia due to other mental disorder: Secondary | ICD-10-CM

## 2021-09-17 DIAGNOSIS — F3289 Other specified depressive episodes: Secondary | ICD-10-CM

## 2021-09-17 DIAGNOSIS — F411 Generalized anxiety disorder: Secondary | ICD-10-CM

## 2021-09-17 DIAGNOSIS — F331 Major depressive disorder, recurrent, moderate: Secondary | ICD-10-CM

## 2021-09-17 MED ORDER — ESCITALOPRAM OXALATE 20 MG PO TABS
20.0000 mg | ORAL_TABLET | Freq: Every day | ORAL | 3 refills | Status: DC
  Filled 2021-09-17: qty 30, 30d supply, fill #0

## 2021-09-17 MED ORDER — ALPRAZOLAM 0.25 MG PO TABS
0.2500 mg | ORAL_TABLET | Freq: Two times a day (BID) | ORAL | 0 refills | Status: DC | PRN
  Filled 2021-09-17: qty 20, 10d supply, fill #0

## 2021-09-17 NOTE — Progress Notes (Signed)
[facility] ? ?Patient ID: Alejandro [name],  ?MRN: [id number] ? ?PCP: Tonia Ghent, MD ? ?Date of Evaluation: 09/17/2021 ?Time spent:30 minutes ? ?Chief Complaint:  ?Chief Complaint   ?Anxiety; Depression; Follow-up; Medication Refill ?  ? ? ?HISTORY/CURRENT STATUS: ?HPI ? ?41 year old male presents to this office for follow up and medication management. Says that he feels like medication has worked very well but still needs a little more work. He says that he has improved about 85% since last visit. Still having some occasional episodes of anxiety but becoming more manageable. He is not working yet but is being more social. He gets out for coffee with supportive friends and things are progressing well.   Says his anxiety is 2/10 today and his depression is 1/10. He is sleeping 7-8 hours per night.  He denies mania, no psychosis. He denies any SI/HI.  ?  ?Prior psychiatric medication trials: ?Lexapro ? ? ?Individual Medical History/ Review of Systems: Changes? :No  ? ?Allergies: Patient has no known allergies. ? ?Current Medications:  ?Current Outpatient Medications:  ?  ALPRAZolam (XANAX) 0.25 MG tablet, Take 1 tablet (0.25 mg total) by mouth 2 (two) times daily as needed for anxiety., Disp: 20 tablet, Rfl: 0 ?  aspirin EC 81 MG tablet, Take 81 mg by mouth daily. Swallow whole., Disp: , Rfl:  ?  atorvastatin (LIPITOR) 40 MG tablet, Take 1 tablet (40 mg total) by mouth daily., Disp: 90 tablet, Rfl: 3 ?  carvedilol (COREG) 3.125 MG tablet, Take 1 tablet (3.125 mg total) by mouth 2 (two) times daily with a meal., Disp: 60 tablet, Rfl: 6 ?  cyclobenzaprine (FLEXERIL) 10 MG tablet, Take 10 mg by mouth as needed., Disp: , Rfl:  ?  escitalopram (LEXAPRO) 20 MG tablet, Take 1 tablet (20 mg total) by mouth daily., Disp: 90 tablet, Rfl: 3 ?  tirzepatide (MOUNJARO) 15 MG/0.5ML Pen, Inject 15 mg into the skin once a week., Disp: 6 mL, Rfl: 0 ?  traZODone (DESYREL) 50 MG tablet, Take 1 tablet (50 mg total) by  mouth at bedtime., Disp: 30 tablet, Rfl: 3 ?  Vilazodone HCl (VIIBRYD) 40 MG TABS, Take 1 tablet (40 mg total) by mouth daily., Disp: 30 tablet, Rfl: 2 ?  Vilazodone HCl 20 MG TABS, Take 1/2 tablet 10 mg daily for 7 days, then take one whole tablet 20 mg total daily., Disp: 30 tablet, Rfl: 1 ?  Vitamin D, Ergocalciferol, (DRISDOL) 1.25 MG (50000 UNIT) CAPS capsule, Take 1 capsule (50,000 Units total) by mouth every 7 (seven) days., Disp: 12 capsule, Rfl: 0 ?Medication Side Effects: none ? ?Family Medical/ Social History: Changes? No ? ?MENTAL HEALTH EXAM: ? ?There were no vitals taken for this visit.There is no height or weight on file to calculate BMI.  ?General Appearance: Casual and Neat  ?Eye Contact:  Good  ?Speech:  Clear and Coherent  ?Volume:  Normal  ?Mood:  Anxious  ?Affect:  Appropriate and Anxious  ?Thought Process:  Coherent  ?Orientation:  Full (Time, Place, and Person)  ?Thought Content: Logical   ?Suicidal Thoughts:  No  ?Homicidal Thoughts:  No  ?Memory:  WNL  ?Judgement:  Good  ?Insight:  Good  ?Psychomotor Activity:  Normal  ?Concentration:  Concentration: Good  ?Recall:  Good  ?Fund of Knowledge: Good  ?Language: Good  ?Assets:  Desire for Improvement  ?ADL's:  Intact  ?Cognition: WNL  ?Prognosis:  Good  ? ? ?DIAGNOSES:  ?  ICD-10-CM   ?  1. Generalized anxiety disorder  F41.1 ALPRAZolam (XANAX) 0.25 MG tablet  ?  ?2. Major depressive disorder, recurrent episode, moderate (HCC)  F33.1   ?  ?3. Insomnia due to other mental disorder  F51.05   ? F99   ?  ?4. Other depression, with emotional eating  F32.89 escitalopram (LEXAPRO) 20 MG tablet  ?  ? ? ?Receiving Psychotherapy: No  ? ? ?RECOMMENDATIONS:  ? ?Greater than 50% of 30 min face to face time with patient was spent on counseling and coordination of care. We discussed his significant improvement  with anxiety and depression but feels he is not at the level he would like. He is requesting dosage increase this visit.  ?We agreed to: ?Continue  Viibryd to 40 mg daily ?Continue Xanax 0.25 mg twice daily for severe anxiety. ?Will report worsening symptoms or side effects promptly ?To follow up in 2 months to reassess ?Provided emergency contact information ?Discussed potential benefits, risk, and side effects of benzodiazepines to include potential risk of tolerance and dependence, as well as possible drowsiness.  Advised patient not to drive if experiencing drowsiness and to take lowest possible effective dose to minimize risk of dependence and tolerance.   ?Reviewed PDMP  ? ? ? ? ? ?Elwanda Brooklyn, NP  ?

## 2021-09-18 ENCOUNTER — Other Ambulatory Visit (HOSPITAL_COMMUNITY): Payer: Self-pay

## 2021-09-22 ENCOUNTER — Ambulatory Visit (INDEPENDENT_AMBULATORY_CARE_PROVIDER_SITE_OTHER): Payer: Commercial Managed Care - PPO | Admitting: Physician Assistant

## 2021-09-24 ENCOUNTER — Encounter (HOSPITAL_COMMUNITY): Payer: Self-pay

## 2021-09-25 ENCOUNTER — Other Ambulatory Visit (HOSPITAL_COMMUNITY): Payer: Self-pay

## 2021-09-25 ENCOUNTER — Telehealth (HOSPITAL_COMMUNITY): Payer: Self-pay

## 2021-09-25 DIAGNOSIS — E7849 Other hyperlipidemia: Secondary | ICD-10-CM

## 2021-09-25 MED ORDER — ATORVASTATIN CALCIUM 80 MG PO TABS
80.0000 mg | ORAL_TABLET | Freq: Every day | ORAL | 3 refills | Status: DC
  Filled 2021-09-25: qty 30, 30d supply, fill #0

## 2021-09-25 NOTE — Telephone Encounter (Signed)
-----   Message from Larey Dresser, MD sent at 09/15/2021  3:36 PM EDT ----- ?LDL still above goal < 70.  Increase atorvastatin to 80 mg daily, lipids/LFTs in 2 months.  ?

## 2021-09-25 NOTE — Telephone Encounter (Signed)
Pt aware, agreeable, and verbalized understanding lab order entered lab appointment scheduled med list has been updated. ?

## 2021-10-05 ENCOUNTER — Other Ambulatory Visit (HOSPITAL_COMMUNITY): Payer: Self-pay

## 2021-10-05 NOTE — Progress Notes (Signed)
?TeleHealth Visit:  ?This visit was completed with telemedicine (audio/video) technology. ?[name] has verbally consented to this TeleHealth visit. The patient is located at home, the provider is located at home. The participants in this visit include the listed provider and patient. The visit was conducted today via MyChart video. ? ?OBESITY ?Alejandro [name] is here to discuss his progress with his obesity treatment plan along with follow-up of his obesity related diagnoses.  ? ?Today's visit was # 52 ?Starting weight: 254 lbs ?Starting date: 04/09/2019 ?Weight at last in office visit: 206 lbs on 09/15/21 ?Total weight loss: 48 lbs at last in office visit on 09/15/21. ?Today's reported weight: 205 lbs  ? ? ?Nutrition Plan: following a lower carbohydrate, vegetable and lean protein rich diet plan. -75% of time. ?Hunger is well controlled. Cravings are well controlled.  ?Current exercise:  walking 5 days per week and yoga one day per week. ? ?Interim History: Alejandro [name] reports he did well last week on the plan but has not done quite as well this week.  He notices that he does much better for the day when he has breakfast.  He says it sets the tone for the whole day.  He is very busy with his sons who play several sports.  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 ? ?Assessment/Plan:  ?Prediabetes ?Claudis has a diagnosis of prediabetes based on his elevated HgA1c. ?He denies polyphagia. ?Medication(s): Mounjaro 15 mg weekly.  He feels this provides very good appetite suppression.  He denies side effects. ?Lab Results  ?Component Value Date  ? HGBA1C 5.2 07/07/2021  ? ?Lab Results  ?Component Value Date  ? INSULIN 25.5 (H) 02/03/2021  ? INSULIN 14.2 06/04/2020  ? INSULIN 7.2 12/10/2019  ? INSULIN 32.3 (H) 07/31/2019  ? INSULIN 26.6 (H) 04/09/2019  ? ? ?Plan: ?Continue Mounjaro at 15 mg weekly ? ?2.  Anxiety and depression ?Alejandro [name] lost his job in December and subsequently had a heart attack in  January while on a ski trip with his sons.  He was nice with Takotsubo syndrome. He is currently on Viibryd and Lexapro and compliant with these.  He is also prescribed Xanax twice daily as needed.  He feels his mood is fairly stable but reports he attended a party this past weekend in which he had to recount the story of his heart attack several times which caused him to have quite a bit of anxiety the next day.  He says he is out of the routine with seeing his counselor every 2 weeks and knows he needs to get back to this. ? ?Plan: ?Continue Viibryd and Lexapro as directed by psychiatry. ?Set up appointment with counselor. ? ?3.  Obesity: Current BMI 31.33 ?[name] is currently in the action stage of change. As such, his goal is to continue with weight loss efforts.  ?He has agreed to following a lower carbohydrate, vegetable and lean protein rich diet plan.  ? ?Exercise goals: walking 5 days per week. Resistance 2-3 days per week.  ? ?Behavioral modification strategies: increasing lean protein intake and no skipping meals. ? ?Alejandro [name] has agreed to follow-up with our clinic in 2 weeks.  ? ?No orders of the defined types were placed in this encounter. ? ? ?Medications Discontinued During This Encounter  ?Medication Reason  ? tirzepatide Darcel Bayley) 15 MG/0.5ML Pen Reorder  ?  ? ?Meds ordered this encounter  ?Medications  ? tirzepatide (MOUNJARO) 15 MG/0.5ML Pen  ?  Sig: Inject 15 mg into  the skin once a week.  ?  Dispense:  6 mL  ?  Refill:  0  ?  Order Specific Question:   Supervising Provider  ?  Answer:   Dennard Nip D [[id number]]  ?   ? ?Objective:  ? ?VITALS: Per patient if applicable, see vitals. ?GENERAL: Alert and in no acute distress. ?CARDIOPULMONARY: No increased WOB. Speaking in clear sentences.  ?PSYCH: Pleasant and cooperative. Speech normal rate and rhythm. Affect is appropriate. Insight and judgement are appropriate. Attention is focused, linear, and appropriate.  ?NEURO: Oriented as arrived to  appointment on time with no prompting.  ? ?Lab Results  ?Component Value Date  ? CREATININE 0.89 07/07/2021  ? BUN 11 07/07/2021  ? NA 136 07/07/2021  ? K 4.3 07/07/2021  ? CL 103 07/07/2021  ? CO2 24 07/07/2021  ? ?Lab Results  ?Component Value Date  ? ALT 36 09/15/2021  ? AST 22 09/15/2021  ? ALKPHOS 47 09/15/2021  ? BILITOT 0.6 09/15/2021  ? ?Lab Results  ?Component Value Date  ? HGBA1C 5.2 07/07/2021  ? HGBA1C 5.8 (H) 02/03/2021  ? HGBA1C 5.3 06/04/2020  ? HGBA1C 5.2 12/10/2019  ? HGBA1C 5.7 (H) 07/31/2019  ? ?Lab Results  ?Component Value Date  ? INSULIN 25.5 (H) 02/03/2021  ? INSULIN 14.2 06/04/2020  ? INSULIN 7.2 12/10/2019  ? INSULIN 32.3 (H) 07/31/2019  ? INSULIN 26.6 (H) 04/09/2019  ? ?Lab Results  ?Component Value Date  ? TSH 1.830 04/09/2019  ? ?Lab Results  ?Component Value Date  ? CHOL 173 09/15/2021  ? HDL 59 09/15/2021  ? [place] 93 09/15/2021  ? LDLDIRECT 224.0 08/26/2017  ? TRIG 104 09/15/2021  ? CHOLHDL 2.9 09/15/2021  ? ?Lab Results  ?Component Value Date  ? WBC 6.4 07/07/2021  ? HGB 17.0 07/07/2021  ? HCT 49.6 07/07/2021  ? MCV 86.6 07/07/2021  ? PLT 494 (H) 07/07/2021  ? ?No results found for: IRON, TIBC, FERRITIN ?Lab Results  ?Component Value Date  ? VD25OH 33.5 02/03/2021  ? VD25OH 24.8 (L) 06/04/2020  ? VD25OH 58.2 12/10/2019  ? ? ?Attestation Statements:  ? ?Reviewed by clinician on day of visit: allergies, medications, problem list, medical history, surgical history, family history, social history, and previous encounter notes. ? ?Time spent on visit including pre-visit chart review and post-visit charting and care was 31 minutes.  ? ? ?

## 2021-10-06 ENCOUNTER — Encounter (INDEPENDENT_AMBULATORY_CARE_PROVIDER_SITE_OTHER): Payer: Self-pay | Admitting: Family Medicine

## 2021-10-06 ENCOUNTER — Other Ambulatory Visit (HOSPITAL_COMMUNITY): Payer: Self-pay

## 2021-10-06 ENCOUNTER — Telehealth (INDEPENDENT_AMBULATORY_CARE_PROVIDER_SITE_OTHER): Payer: Commercial Managed Care - PPO | Admitting: Family Medicine

## 2021-10-06 DIAGNOSIS — F419 Anxiety disorder, unspecified: Secondary | ICD-10-CM | POA: Diagnosis not present

## 2021-10-06 DIAGNOSIS — E669 Obesity, unspecified: Secondary | ICD-10-CM | POA: Diagnosis not present

## 2021-10-06 DIAGNOSIS — F32A Depression, unspecified: Secondary | ICD-10-CM

## 2021-10-06 DIAGNOSIS — E6609 Other obesity due to excess calories: Secondary | ICD-10-CM

## 2021-10-06 DIAGNOSIS — R7303 Prediabetes: Secondary | ICD-10-CM | POA: Diagnosis not present

## 2021-10-06 DIAGNOSIS — Z6831 Body mass index (BMI) 31.0-31.9, adult: Secondary | ICD-10-CM

## 2021-10-06 MED ORDER — MOUNJARO 15 MG/0.5ML ~~LOC~~ SOAJ
15.0000 mg | SUBCUTANEOUS | 0 refills | Status: DC
  Filled 2021-10-06: qty 2, 28d supply, fill #0

## 2021-10-08 ENCOUNTER — Other Ambulatory Visit (HOSPITAL_COMMUNITY): Payer: Self-pay

## 2021-10-15 ENCOUNTER — Encounter (HOSPITAL_COMMUNITY): Payer: Self-pay | Admitting: Cardiology

## 2021-10-15 ENCOUNTER — Ambulatory Visit (HOSPITAL_BASED_OUTPATIENT_CLINIC_OR_DEPARTMENT_OTHER)
Admission: RE | Admit: 2021-10-15 | Discharge: 2021-10-15 | Disposition: A | Discharge status: Home / Self Care | Payer: Commercial Managed Care - PPO | Source: Ambulatory Visit | Attending: Cardiology | Admitting: Cardiology

## 2021-10-15 ENCOUNTER — Ambulatory Visit (HOSPITAL_COMMUNITY)
Admission: RE | Admit: 2021-10-15 | Discharge: 2021-10-15 | Disposition: A | Discharge status: Home / Self Care | Payer: Commercial Managed Care - PPO | Source: Ambulatory Visit | Attending: Cardiology | Admitting: Cardiology

## 2021-10-15 ENCOUNTER — Other Ambulatory Visit (HOSPITAL_COMMUNITY): Payer: Self-pay

## 2021-10-15 VITALS — BP 120/70 | HR 76 | Wt 208.4 lb

## 2021-10-15 DIAGNOSIS — F419 Anxiety disorder, unspecified: Secondary | ICD-10-CM | POA: Diagnosis not present

## 2021-10-15 DIAGNOSIS — Z7985 Long-term (current) use of injectable non-insulin antidiabetic drugs: Secondary | ICD-10-CM | POA: Insufficient documentation

## 2021-10-15 DIAGNOSIS — Z7982 Long term (current) use of aspirin: Secondary | ICD-10-CM | POA: Insufficient documentation

## 2021-10-15 DIAGNOSIS — E782 Mixed hyperlipidemia: Secondary | ICD-10-CM | POA: Insufficient documentation

## 2021-10-15 DIAGNOSIS — I5181 Takotsubo syndrome: Secondary | ICD-10-CM

## 2021-10-15 DIAGNOSIS — Z79899 Other long term (current) drug therapy: Secondary | ICD-10-CM | POA: Diagnosis not present

## 2021-10-15 DIAGNOSIS — Z8679 Personal history of other diseases of the circulatory system: Secondary | ICD-10-CM | POA: Insufficient documentation

## 2021-10-15 DIAGNOSIS — E7800 Pure hypercholesterolemia, unspecified: Secondary | ICD-10-CM | POA: Insufficient documentation

## 2021-10-15 DIAGNOSIS — R7989 Other specified abnormal findings of blood chemistry: Secondary | ICD-10-CM | POA: Diagnosis not present

## 2021-10-15 DIAGNOSIS — E663 Overweight: Secondary | ICD-10-CM | POA: Insufficient documentation

## 2021-10-15 DIAGNOSIS — I252 Old myocardial infarction: Secondary | ICD-10-CM | POA: Insufficient documentation

## 2021-10-15 DIAGNOSIS — F32A Depression, unspecified: Secondary | ICD-10-CM | POA: Diagnosis not present

## 2021-10-15 LAB — LIPID PANEL
Cholesterol: 159 mg/dL (ref 0–200)
HDL: 63 mg/dL (ref >40)
LDL Cholesterol: 82 mg/dL (ref 0–99)
Total CHOL/HDL Ratio: 2.5 RATIO
Triglycerides: 69 mg/dL (ref <150)
VLDL: 14 mg/dL (ref 0–40)

## 2021-10-15 LAB — HEPATIC FUNCTION PANEL
ALT: 35 U/L (ref 0–44)
AST: 23 U/L (ref 15–41)
Albumin: 4.4 g/dL (ref 3.5–5.0)
Alkaline Phosphatase: 55 U/L (ref 38–126)
Bilirubin, Direct: 0.1 mg/dL (ref 0.0–0.2)
Indirect Bilirubin: 0.4 mg/dL (ref 0.3–0.9)
Total Bilirubin: 0.5 mg/dL (ref 0.3–1.2)
Total Protein: 7.3 g/dL (ref 6.5–8.1)

## 2021-10-15 LAB — ECHOCARDIOGRAM COMPLETE
AR max vel: 4.87 cm2
AV Peak grad: 6.9 mmHg
Ao pk vel: 1.31 m/s
Area-P 1/2: 2.78 cm2
S' Lateral: 2.7 cm

## 2021-10-15 MED ORDER — ROSUVASTATIN CALCIUM 40 MG PO TABS
40.0000 mg | ORAL_TABLET | Freq: Every day | ORAL | 3 refills | Status: DC
  Filled 2021-10-15: qty 30, 30d supply, fill #0
  Filled 2021-12-12: qty 30, 30d supply, fill #1
  Filled 2022-02-05: qty 30, 30d supply, fill #2
  Filled 2022-03-19 (×2): qty 30, 30d supply, fill #3
  Filled 2022-04-19: qty 30, 30d supply, fill #4

## 2021-10-15 NOTE — Patient Instructions (Signed)
Medication Changes: ? ?Stop atorvastatin  ? ?Start Crestor 40 mg daily ? ?Lab Work: ? ?Labs done today, your results will be available in MyChart, we will contact you for abnormal readings. ? ? ?Testing/Procedures: ? ?Repeat blood work as scheduled  ? ?Referrals: ? ?none ? ?Special Instructions // Education: ? ?none ? ?Follow-Up in: 1 year (May 2024)  **please call the office in February 2024 to arrange your follow up appointment ** ? ?At the [facility], you and your health needs are our priority. We have a designated team specialized in the treatment of Heart Failure. This Care Team includes your primary Heart Failure Specialized Cardiologist (physician), Advanced Practice Providers (APPs- Physician Assistants and Nurse Practitioners), and Pharmacist who all work together to provide you with the care you need, when you need it.  ? ?You may see any of the following providers on your designated Care Team at your next follow up: ? ?Dr Glori Bickers ?Dr Loralie Champagne ?Darrick Grinder, NP ?Lyda Jester, PA ?Jessica Milford,NP ?Marlyce Huge, PA ?Audry Riles, PharmD ? ? ?Please be sure to bring in all your medications bottles to every appointment.  ? ?Need to Contact us: ? ?If you have any questions or concerns before your next appointment please send [place] a message through [place] or call our office at [phone].   ? ?TO LEAVE A MESSAGE FOR THE NURSE SELECT OPTION 2, PLEASE LEAVE A MESSAGE INCLUDING: ?YOUR NAME ?DATE OF BIRTH ?CALL BACK NUMBER ?REASON FOR CALL**this is important as we prioritize the call backs ? ?YOU WILL RECEIVE A CALL BACK THE SAME DAY AS LONG AS YOU CALL BEFORE 4:00 PM ? ? ?

## 2021-10-17 NOTE — Progress Notes (Signed)
PCP: Tonia Ghent, MD ?Cardiology: Dr. Aundra Dubin ? ?41 y.o. 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  Patient had no prior cardiac history.  He has been on atorvastatin for elevated cholesterol and has been on tirzepatide to aid with weight loss.  He lost his job right before Christmas 2022 and has been under a lot of stress.  He began to note episodes of chest pain after this, mild squeezing in the central chest with stress.  He went skiing with his kids in 1/23.  At the top of a slope, he developed severe substernal chest tightness that did not relent.  He was taken to the hospital in [place] by EMS, ECG abnormalities were noted.  Troponin was mildly elevated. CTA chest showed no PE or dissection.  He had LHC, showing no significant coronary disease but there was apical dyskinesis and overall EF 60%.  This was thought to be consistent with stress (Takotsubo-type) cardiomyopathy.  He has a family history of CAD/MI, no h/o cardiomyopathy.  No recent viral illness or fever.  He does not smoke or drink ETOH.  ? ?Echo was done in 1/23.  This showed EF 55-60%, severe hypokinesis in the mid septum.  Cardiac MRI in 2/23 showed LV EF 55% with mild hypokinesis in the mid septum, normal RV, no LGE.  ? ?Echo was done today and reviewed, EF 09-23%, normal diastolic function with normal GLS, no regional wall motion abnormalities, normal RV.  ? ?Patient is doing well.  He has lost about 10 lbs.  He is no longer taking Coreg.  No further significant chest pain.  No exertional dyspnea. He is walking for exercise. He has been working out with [employer].   ? ?Labs (8/22): LDL 77, HDL 86 ?Labs (1/23): K 4, creatinine 0.91, plts 442, LDL 88 ?Labs (4/23): LDL 93 ? ?PMH: ?1. Hyperlipidemia ?2. Depression/anxiety ?3. Pre-diabetes ?4. H/o ruptured disc in back. ?5. Stress (Takotsubo-type) cardiomyopathy: NSTEMI in 1/23 while skiing.   ?- LHC  (1/23): No significant coronary disease, apical dyskinesis on LV-gram with EF 60%.  ?- Echo (1/23): EF 55-60%, severe hypokinesis in the mid septum ?- Cardiac MRI (2/23): LV EF 55% with mild hypokinesis in the mid septum, normal RV, no LGE.  ?- Echo (5/23): EF 30-07%, normal diastolic function with normal GLS, no regional wall motion abnormalities, normal RV.  ? ?SH: Married, 2 children, works in Product manager.  No smoking, no ETOH.  ? ?FH: Grandfather with MI in his 106s, other grandfather with MI in his 26s.  ? ?ROS: All systems reviewed and negative except as per HPI.  ? ?Current Outpatient Medications  ?Medication Sig Dispense Refill  ? ALPRAZolam (XANAX) 0.25 MG tablet Take 1 tablet (0.25 mg total) by mouth 2 (two) times daily as needed for anxiety. 20 tablet 0  ? cyclobenzaprine (FLEXERIL) 10 MG tablet Take 10 mg by mouth as needed.    ? escitalopram (LEXAPRO) 20 MG tablet Take 1 tablet (20 mg total) by mouth daily. 90 tablet 3  ? rosuvastatin (CRESTOR) 40 MG tablet Take 1 tablet (40 mg total) by mouth daily. 90 tablet 3  ? tirzepatide (MOUNJARO) 15 MG/0.5ML Pen Inject 15 mg into the skin once a week. 6 mL 0  ? traZODone (DESYREL) 50 MG tablet Take 1 tablet (50 mg total) by mouth at bedtime. 30 tablet 3  ? Vilazodone HCl (VIIBRYD) 40 MG TABS Take 1 tablet (40 mg total) by mouth  daily. 30 tablet 2  ? Vitamin D, Ergocalciferol, (DRISDOL) 1.25 MG (50000 UNIT) CAPS capsule Take 1 capsule (50,000 Units total) by mouth every 7 (seven) days. 12 capsule 0  ? aspirin EC 81 MG tablet Take 81 mg by mouth daily. Swallow whole. (Patient not taking: Reported on 10/15/2021)    ? carvedilol (COREG) 3.125 MG tablet Take 1 tablet (3.125 mg total) by mouth 2 (two) times daily with a meal. (Patient not taking: Reported on 10/15/2021) 60 tablet 6  ? ?No current facility-administered medications for this encounter.  ? ?Blood pressure 120/70, pulse 76, weight 94.5 kg (208 lb 6.4 oz), SpO2 97 %. ?General: NAD ?Neck: No JVD, no  thyromegaly or thyroid nodule.  ?Lungs: Clear to auscultation bilaterally with normal respiratory effort. ?CV: Nondisplaced PMI.  Heart regular S1/S2, no S3/S4, no murmur.  No peripheral edema.  No carotid bruit.  Normal pedal pulses.  ?Abdomen: Soft, nontender, no hepatosplenomegaly, no distention.  ?Skin: Intact without lesions or rashes.  ?Neurologic: Alert and oriented x 3.  ?Psych: Normal affect. ?Extremities: No clubbing or cyanosis.  ?HEENT: Normal.  ? ?Assessment/Plan: ?1. Cardiomyopathy: Patient presented with NSTEMI in 1/23, cath showed no significant coronary disease with LV-gram showing EF 60% but apical dyskinesis.  Given significant stressors and anxiety, this episode most likely represents a stress (Takotsubo-type) cardiomyopathy.  No viral prodrome to suggest viral myocarditis.  Echo in 1/23 showed LV EF 55-60% with severe hypokinesis in the mid septum, which is an unusual pattern for Takotsubo.  Cardiac MRI done in 2/23 showed EF 59% with very mild mid septal hypokinesis.  There was no delayed enhancement, so improving wall motion abnormality + lack of scar does suggest a stress cardiomyopathy. Fitting this diagnosis, echo today showed normal LV systolic function and wall motion. He is euvolemic on exam, NYHA class I symptoms.   ?- He can stay off Coreg.   ?- Recurrence is possible, he needs to work on stress management/anxiety control.  ?2. Hyperlipidemia: Given strong family history of CAD (GF with MI in early 68s), would like to see LDL < 70.  ?- Stop atorvastatin, start Crestor 40 mg daily.  Lipids/LFTs in 2 months.  ?3. Depression/anxiety: Working with a Engineer, water.  ?4. Overweight: [facility] and is taking tirzepatide.  ? ?Followup in 1 year.  ? ?Loralie Champagne ?10/17/2021 ? ?

## 2021-10-19 NOTE — Progress Notes (Signed)
?TeleHealth Visit:  ?This visit was completed with telemedicine (audio/video) technology. ?Alejandro [name] has verbally consented to this TeleHealth visit. The patient is located at home, the provider is located at home. The participants in this visit include the listed provider and patient. The visit was conducted today via MyChart video. ? ?OBESITY ?Alejandro [name] is here to discuss his progress with his obesity treatment plan along with follow-up of his obesity related diagnoses.  ? ?Today's visit was # 18 ?Starting weight: 254 lbs ?Starting date: 04/09/2019 ?Weight at last in office visit: 206 lbs on 09/15/21 ?Total weight loss: 48 lbs at last in office visit on 09/15/21. ?Today's reported weight: 206 lbs No weight reported. ?  ?Nutrition Plan: following a lower carbohydrate, vegetable and lean protein rich diet plan.  ?Hunger is well controlled. Cravings are well controlled.  ?Current exercise:  walking, cardio, light weights, and yoga 3-4 days per week.  ? ?Interim History: Alejandro [name] reports Alejandro [name] is struggling with consistency.  Alejandro [name] seems to do well on the plan for few weeks and then falls off.  Alejandro [name] knows this all boils down to planning and having healthy foods in the home.  Alejandro [name] is going to the grocery store today.  Alejandro [name] wants to stick with low-carb but tends to eat some fruit also. ?Alejandro [name] is exercising 3 to 4 days/week and also very active throughout the week with his sons who are in sports.  They have created a pickleball court in their backyard and his family is playing. ? ?Assessment/Plan:  ?1. Prediabetes ?A1c was 5.2 in January. ?Alejandro [name] denies polyphagia. ?Medication(s): Mounjaro 15 mg weekly.  Alejandro [name] likely has 2 more refills left on his 29-monthcoupon.  Alejandro [name] has switched to his wife's insurance and it is unknown whether they will cover antiobesity medication. ?Lab Results  ?Component Value Date  ? HGBA1C 5.2 07/07/2021  ? ?Lab Results  ?Component Value Date  ? INSULIN 25.5 (H) 02/03/2021  ? INSULIN 14.2 06/04/2020  ? INSULIN 7.2  12/10/2019  ? INSULIN 32.3 (H) 07/31/2019  ? INSULIN 26.6 (H) 04/09/2019  ? ? ?Plan: ?Continue Mounjaro 15 mg weekly ? ?2. Anxiety and depression ?Alejandro [name] feels current mood is stable. Seeing therapist weekly ([email or web address] and PMHNP every 3 months.  Alejandro [name] had an MRI in January which was determined to be caused from Takotsubo syndrome.  Cath revealed clean vessels.  Current EF is 60 to 65%. ?Current medications: Viibryd 40 mg daily, Lexapro 20 mg daily, trazodone for sleep. ? ?Plan: ?Follow-up with therapist and NP as directed.  Continue all medications as directed. ? ? ?3. Obesity: Current BMI 31.33 ?Alejandro [name] currently in the action stage of change. As such, his goal is to continue with weight loss efforts.  ?Alejandro [name] has agreed to following a lower carbohydrate, vegetable and lean protein rich diet plan plus two 100-calorie servings of fruit daily if desired. ? ?Exercise goals: Continue current regimen ? ?Behavioral modification strategies: decreasing simple carbohydrates and decreasing eating out. ? ?Alejandro [name] agreed to follow-up with our clinic in 2 weeks.  ? ?No orders of the defined types were placed in this encounter. ? ? ?Medications Discontinued During This Encounter  ?Medication Reason  ? carvedilol (COREG) 3.125 MG tablet Completed Course  ? aspirin EC 81 MG tablet Completed Course  ?  ? ?No orders of the defined types were placed in this encounter. ?   ? ?Objective:  ? ?VITALS: Per patient if applicable, see vitals. ?GENERAL: Alert and in no acute distress. ?CARDIOPULMONARY: No increased WOB. Speaking  in clear sentences.  ?PSYCH: Pleasant and cooperative. Speech normal rate and rhythm. Affect is appropriate. Insight and judgement are appropriate. Attention is focused, linear, and appropriate.  ?NEURO: Oriented as arrived to appointment on time with no prompting.  ? ?Lab Results  ?Component Value Date  ? CREATININE 0.89 07/07/2021  ? BUN 11 07/07/2021  ? NA 136 07/07/2021  ? K 4.3 07/07/2021  ? CL 103 07/07/2021   ? CO2 24 07/07/2021  ? ?Lab Results  ?Component Value Date  ? ALT 35 10/15/2021  ? AST 23 10/15/2021  ? ALKPHOS 55 10/15/2021  ? BILITOT 0.5 10/15/2021  ? ?Lab Results  ?Component Value Date  ? HGBA1C 5.2 07/07/2021  ? HGBA1C 5.8 (H) 02/03/2021  ? HGBA1C 5.3 06/04/2020  ? HGBA1C 5.2 12/10/2019  ? HGBA1C 5.7 (H) 07/31/2019  ? ?Lab Results  ?Component Value Date  ? INSULIN 25.5 (H) 02/03/2021  ? INSULIN 14.2 06/04/2020  ? INSULIN 7.2 12/10/2019  ? INSULIN 32.3 (H) 07/31/2019  ? INSULIN 26.6 (H) 04/09/2019  ? ?Lab Results  ?Component Value Date  ? TSH 1.830 04/09/2019  ? ?Lab Results  ?Component Value Date  ? CHOL 159 10/15/2021  ? HDL 63 10/15/2021  ? [place] 82 10/15/2021  ? LDLDIRECT 224.0 08/26/2017  ? TRIG 69 10/15/2021  ? CHOLHDL 2.5 10/15/2021  ? ?Lab Results  ?Component Value Date  ? WBC 6.4 07/07/2021  ? HGB 17.0 07/07/2021  ? HCT 49.6 07/07/2021  ? MCV 86.6 07/07/2021  ? PLT 494 (H) 07/07/2021  ? ?No results found for: IRON, TIBC, FERRITIN ?Lab Results  ?Component Value Date  ? VD25OH 33.5 02/03/2021  ? VD25OH 24.8 (L) 06/04/2020  ? VD25OH 58.2 12/10/2019  ? ? ?Attestation Statements:  ? ?Reviewed by clinician on day of visit: allergies, medications, problem list, medical history, surgical history, family history, social history, and previous encounter notes. ? ? ?

## 2021-10-20 ENCOUNTER — Encounter (INDEPENDENT_AMBULATORY_CARE_PROVIDER_SITE_OTHER): Payer: Self-pay | Admitting: Family Medicine

## 2021-10-20 ENCOUNTER — Telehealth (INDEPENDENT_AMBULATORY_CARE_PROVIDER_SITE_OTHER): Payer: Commercial Managed Care - PPO | Admitting: Family Medicine

## 2021-10-20 ENCOUNTER — Encounter (INDEPENDENT_AMBULATORY_CARE_PROVIDER_SITE_OTHER): Payer: Self-pay

## 2021-10-20 DIAGNOSIS — Z6831 Body mass index (BMI) 31.0-31.9, adult: Secondary | ICD-10-CM

## 2021-10-20 DIAGNOSIS — F419 Anxiety disorder, unspecified: Secondary | ICD-10-CM

## 2021-10-20 DIAGNOSIS — E669 Obesity, unspecified: Secondary | ICD-10-CM

## 2021-10-20 DIAGNOSIS — F32A Depression, unspecified: Secondary | ICD-10-CM

## 2021-10-20 DIAGNOSIS — R7303 Prediabetes: Secondary | ICD-10-CM | POA: Diagnosis not present

## 2021-11-05 ENCOUNTER — Encounter (INDEPENDENT_AMBULATORY_CARE_PROVIDER_SITE_OTHER): Payer: Self-pay | Admitting: Nurse Practitioner

## 2021-11-05 ENCOUNTER — Other Ambulatory Visit (HOSPITAL_COMMUNITY): Payer: Self-pay

## 2021-11-05 ENCOUNTER — Ambulatory Visit (INDEPENDENT_AMBULATORY_CARE_PROVIDER_SITE_OTHER): Payer: Commercial Managed Care - PPO | Admitting: Nurse Practitioner

## 2021-11-05 VITALS — BP 113/78 | HR 85 | Temp 97.9°F | Ht 68.0 in | Wt 206.0 lb

## 2021-11-05 DIAGNOSIS — Z6831 Body mass index (BMI) 31.0-31.9, adult: Secondary | ICD-10-CM

## 2021-11-05 DIAGNOSIS — R7303 Prediabetes: Secondary | ICD-10-CM | POA: Diagnosis not present

## 2021-11-05 DIAGNOSIS — E669 Obesity, unspecified: Secondary | ICD-10-CM | POA: Diagnosis not present

## 2021-11-05 DIAGNOSIS — Z7985 Long-term (current) use of injectable non-insulin antidiabetic drugs: Secondary | ICD-10-CM

## 2021-11-05 DIAGNOSIS — E6609 Other obesity due to excess calories: Secondary | ICD-10-CM

## 2021-11-05 DIAGNOSIS — E782 Mixed hyperlipidemia: Secondary | ICD-10-CM | POA: Diagnosis not present

## 2021-11-05 MED ORDER — MOUNJARO 15 MG/0.5ML ~~LOC~~ SOAJ
15.0000 mg | SUBCUTANEOUS | 0 refills | Status: DC
  Filled 2021-11-05: qty 2, 28d supply, fill #0

## 2021-11-12 NOTE — Progress Notes (Signed)
Chief Complaint:   OBESITY Alejandro [name] is here to discuss his progress with his obesity treatment plan along with follow-up of his obesity related diagnoses. Alejandro [name] is on following a lower carbohydrate, vegetable and lean protein rich diet plan and states he is following his eating plan approximately 70% of the time. Alejandro [name] states he is walking for 30 5 times per week, yoga for 40 minutes 5 times per week and weight training for 60 minutes 5 times per week.  Today's visit was #: 37 Starting weight: 254 lbs Starting date: 04/09/2019 Today's weight: 206 lbs Today's date: 11/05/2021 Total lbs lost to date: 48 lbs Total lbs lost since last in-office visit: 0  Interim History: Alejandro [name] highest weight was 267 lbs. He has maintained his weight since his last visit. He is following low carb plan with added fruit. He doesn't feel he is eating enough protein. He feels [name] helps with hunger and cravings. He is drinking water, whole milk, [employer], and coffee.   Subjective:   1. Pre-diabetes Alejandro [name] is taking Mounjaro 15 mg and is doing well. He denies side effects. He denies polyphagia and cravings. His A1C has responded well. It has decreased from 5.8 to 5.2.  2. Mixed hyperlipidemia Alejandro [name] is taking Crestor 40 mg. He denies side effects. His last lipids obtained by cardiology 10/15/2021.  Assessment/Plan:   1. Pre-diabetes Alejandro [name] will continue to work on weight loss, exercise, and decreasing simple carbohydrates to help decrease the risk of diabetes. We will refill Mounjaro 15 mg for 1 month with no refills. We discussed side effects.   - tirzepatide (MOUNJARO) 15 MG/0.5ML Pen; Inject 15 mg into the skin once a week.  Dispense: 6 mL; Refill: 0  2. Mixed hyperlipidemia Cardiovascular risk and specific lipid/LDL goals reviewed.  We discussed several lifestyle modifications today and Alejandro [name] will continue to work on diet, exercise and weight loss efforts. Alejandro [name] will continue to follow  up with his primary care physician and cardiology. He will continue medications as directed. Labs reviewed with Alejandro [name] and questions answered. Orders and follow up as documented in patient record.   Counseling Intensive lifestyle modifications are the first line treatment for this issue. Dietary changes: Increase soluble fiber. Decrease simple carbohydrates. Exercise changes: Moderate to vigorous-intensity aerobic activity 150 minutes per week if tolerated. Lipid-lowering medications: see documented in medical record.  3. Obesity: Current BMI 31.4 Alejandro [name] is currently in the action stage of change. As such, his goal is to continue with weight loss efforts. He has agreed to following a lower carbohydrate, vegetable and lean protein rich diet plan.   Eating Out Guide was provided today.   Exercise goals:  As is.  Behavioral modification strategies: increasing lean protein intake, increasing water intake, and planning for success.  Alejandro [name] has agreed to follow-up with our clinic in 4 weeks. He was informed of the importance of frequent follow-up visits to maximize his success with intensive lifestyle modifications for his multiple health conditions.   Objective:   Blood pressure 113/78, pulse 85, temperature 97.9 F (36.6 C), height '5\' 8"'$  (1.727 m), weight 206 lb (93.4 kg), SpO2 98 %. Body mass index is 31.32 kg/m.  General: Cooperative, alert, well developed, in no acute distress. HEENT: Conjunctivae and lids unremarkable. Cardiovascular: Regular rhythm.  Lungs: Normal work of breathing. Neurologic: No focal deficits.   Lab Results  Component Value Date   CREATININE 0.89 07/07/2021   BUN 11 07/07/2021   NA 136 07/07/2021   K 4.3 07/07/2021  CL 103 07/07/2021   CO2 24 07/07/2021   Lab Results  Component Value Date   ALT 35 10/15/2021   AST 23 10/15/2021   ALKPHOS 55 10/15/2021   BILITOT 0.5 10/15/2021   Lab Results  Component Value Date   HGBA1C 5.2 07/07/2021    HGBA1C 5.8 (H) 02/03/2021   HGBA1C 5.3 06/04/2020   HGBA1C 5.2 12/10/2019   HGBA1C 5.7 (H) 07/31/2019   Lab Results  Component Value Date   INSULIN 25.5 (H) 02/03/2021   INSULIN 14.2 06/04/2020   INSULIN 7.2 12/10/2019   INSULIN 32.3 (H) 07/31/2019   INSULIN 26.6 (H) 04/09/2019   Lab Results  Component Value Date   TSH 1.830 04/09/2019   Lab Results  Component Value Date   CHOL 159 10/15/2021   HDL 63 10/15/2021   LDLCALC 82 10/15/2021   LDLDIRECT 224.0 08/26/2017   TRIG 69 10/15/2021   CHOLHDL 2.5 10/15/2021   Lab Results  Component Value Date   VD25OH 33.5 02/03/2021   VD25OH 24.8 (L) 06/04/2020   VD25OH 58.2 12/10/2019   Lab Results  Component Value Date   WBC 6.4 07/07/2021   HGB 17.0 07/07/2021   HCT 49.6 07/07/2021   MCV 86.6 07/07/2021   PLT 494 (H) 07/07/2021   No results found for: IRON, TIBC, FERRITIN  Attestation Statements:   Reviewed by clinician on day of visit: allergies, medications, problem list, medical history, surgical history, family history, social history, and previous encounter notes.  I, Lizbeth Bark, RMA, am acting as Location manager for Everardo Pacific, FNP.  I have reviewed the above documentation for accuracy and completeness, and I agree with the above. Everardo Pacific, FNP

## 2021-11-17 ENCOUNTER — Encounter: Payer: Self-pay | Admitting: Behavioral Health

## 2021-11-17 ENCOUNTER — Ambulatory Visit (INDEPENDENT_AMBULATORY_CARE_PROVIDER_SITE_OTHER): Payer: Commercial Managed Care - PPO | Admitting: Behavioral Health

## 2021-11-17 ENCOUNTER — Other Ambulatory Visit (HOSPITAL_COMMUNITY): Payer: Self-pay

## 2021-11-17 DIAGNOSIS — F5105 Insomnia due to other mental disorder: Secondary | ICD-10-CM | POA: Diagnosis not present

## 2021-11-17 DIAGNOSIS — F411 Generalized anxiety disorder: Secondary | ICD-10-CM | POA: Diagnosis not present

## 2021-11-17 DIAGNOSIS — F99 Mental disorder, not otherwise specified: Secondary | ICD-10-CM

## 2021-11-17 DIAGNOSIS — F3289 Other specified depressive episodes: Secondary | ICD-10-CM

## 2021-11-17 DIAGNOSIS — F331 Major depressive disorder, recurrent, moderate: Secondary | ICD-10-CM

## 2021-11-17 MED ORDER — ALPRAZOLAM 0.25 MG PO TABS
0.2500 mg | ORAL_TABLET | Freq: Two times a day (BID) | ORAL | 0 refills | Status: DC | PRN
  Filled 2021-11-17: qty 20, 10d supply, fill #0

## 2021-11-17 MED ORDER — TRAZODONE HCL 50 MG PO TABS
50.0000 mg | ORAL_TABLET | Freq: Every day | ORAL | 3 refills | Status: DC
  Filled 2021-11-17: qty 30, 30d supply, fill #0
  Filled 2021-12-12: qty 30, 30d supply, fill #1
  Filled 2022-01-15: qty 30, 30d supply, fill #2
  Filled 2022-02-16: qty 30, 30d supply, fill #3

## 2021-11-17 MED ORDER — VILAZODONE HCL 40 MG PO TABS
40.0000 mg | ORAL_TABLET | Freq: Every day | ORAL | 2 refills | Status: DC
  Filled 2021-11-17: qty 30, 30d supply, fill #0
  Filled 2021-12-12: qty 30, 30d supply, fill #1

## 2021-11-17 NOTE — Progress Notes (Signed)
[facility]  Patient ID: Alejandro [name],  MRN: [id number]  PCP: Tonia Ghent, MD  Date of Evaluation: 11/17/2021 Time spent:30 minutes  Chief Complaint:  Chief Complaint   Anxiety; Depression; Follow-up; Medication Refill     HISTORY/CURRENT STATUS: HPI  41 year old male presents to this office for follow up and medication management. Says that he feels like medication has worked very well but still needs a little more work. He feels like he continues to make positive progress each month.  He says that he has improved about 85% since last visit. Still having some occasional episodes of anxiety but becoming more manageable. He is trying to get several work ideas started. He gets out for coffee with supportive friends and things are progressing well.   Says his anxiety is 2/10 today and his depression is 1/10. He is sleeping 7-8 hours per night.  He denies mania, no psychosis. He denies any SI/HI.    Prior psychiatric medication trials: Lexapro    Individual Medical History/ Review of Systems: Changes? :No   Allergies: Patient has no known allergies.  Current Medications:  Current Outpatient Medications:    ALPRAZolam (XANAX) 0.25 MG tablet, Take 1 tablet (0.25 mg total) by mouth 2 (two) times daily as needed for anxiety., Disp: 20 tablet, Rfl: 0   cyclobenzaprine (FLEXERIL) 10 MG tablet, Take 10 mg by mouth as needed., Disp: , Rfl:    escitalopram (LEXAPRO) 20 MG tablet, Take 1 tablet (20 mg total) by mouth daily., Disp: 90 tablet, Rfl: 3   rosuvastatin (CRESTOR) 40 MG tablet, Take 1 tablet (40 mg total) by mouth daily., Disp: 90 tablet, Rfl: 3   tirzepatide (MOUNJARO) 15 MG/0.5ML Pen, Inject 15 mg into the skin once a week., Disp: 6 mL, Rfl: 0   traZODone (DESYREL) 50 MG tablet, Take 1 tablet (50 mg total) by mouth at bedtime., Disp: 30 tablet, Rfl: 3   Vilazodone HCl (VIIBRYD) 40 MG TABS, Take 1 tablet (40 mg total) by mouth daily., Disp: 30 tablet, Rfl: 2    Vitamin D, Ergocalciferol, (DRISDOL) 1.25 MG (50000 UNIT) CAPS capsule, Take 1 capsule (50,000 Units total) by mouth every 7 (seven) days., Disp: 12 capsule, Rfl: 0 Medication Side Effects: none  Family Medical/ Social History: Changes? No  MENTAL HEALTH EXAM:  There were no vitals taken for this visit.There is no height or weight on file to calculate BMI.  General Appearance: Casual and Neat  Eye Contact:  Good  Speech:  Clear and Coherent  Volume:  Normal  Mood:  Anxious  Affect:  Appropriate  Thought Process:  Coherent  Orientation:  Full (Time, Place, and Person)  Thought Content: Logical   Suicidal Thoughts:  No  Homicidal Thoughts:  No  Memory:  WNL  Judgement:  Good  Insight:  Good  Psychomotor Activity:  Normal  Concentration:  Concentration: Good  Recall:  Good  Fund of Knowledge: Good  Language: Good  Assets:  Desire for Improvement  ADL's:  Intact  Cognition: WNL  Prognosis:  Good    DIAGNOSES:    ICD-10-CM   1. Generalized anxiety disorder  F41.1 ALPRAZolam (XANAX) 0.25 MG tablet    Vilazodone HCl (VIIBRYD) 40 MG TABS    2. Major depressive disorder, recurrent episode, moderate (HCC)  F33.1 Vilazodone HCl (VIIBRYD) 40 MG TABS    3. Insomnia due to other mental disorder  F51.05 traZODone (DESYREL) 50 MG tablet   F99     4. Other depression,  with emotional eating  F32.89       Receiving Psychotherapy: Yes    RECOMMENDATIONS:   Greater than 50% of 30 min face to face time with patient was spent on counseling and coordination of care. No changes this visit. We discussed his significant improvement  with anxiety and depression but feels he is not at the level he would like. He is requesting dosage increase this visit.  We agreed to: Continue Viibryd to 40 mg daily Continue Xanax 0.25 mg twice daily for severe anxiety. Will report worsening symptoms or side effects promptly To follow up in 2 months to reassess Provided emergency contact  information Discussed potential benefits, risk, and side effects of benzodiazepines to include potential risk of tolerance and dependence, as well as possible drowsiness.  Advised patient not to drive if experiencing drowsiness and to take lowest possible effective dose to minimize risk of dependence and tolerance.   Reviewed PDMP       Elwanda [place], NP

## 2021-11-25 ENCOUNTER — Other Ambulatory Visit (HOSPITAL_COMMUNITY): Payer: Commercial Managed Care - PPO

## 2021-11-25 NOTE — Progress Notes (Signed)
TeleHealth Visit:  This visit was completed with telemedicine (audio/video) technology. Alejandro [name] has verbally consented to this TeleHealth visit. The patient is located at home, the provider is located at home. The participants in this visit include the listed provider and patient. The visit was conducted today via MyChart video.  OBESITY Alejandro [name] is here to discuss his progress with his obesity treatment plan along with follow-up of his obesity related diagnoses.   Today's visit was # 74 Starting weight: 254 lbs Starting date: 04/09/2019 Weight at last in office visit: 206 lbs on 11/05/21 Total weight loss: 48 lbs at last in office visit on 11/05/21. Today's reported weight: 205 lbs   Nutrition Plan: following a lower carbohydrate, vegetable and lean protein rich diet plan.  Hunger is well controlled. Cravings are moderately controlled.  Current exercise:  yoga a few days per week  Interim History: Alejandro [name] reports being off plan up until last week.  Over the past week he has stuck pretty strictly to the low-carb plan.  Upon introspection he has noticed that he tends to get off plan when he is busy, tired, eating for reward.  He has historically struggled with night cravings but this has been well controlled recently. He would like to start doing more cardio exercise.  He has done some running in the past but does not feel he is ready for this.  Assessment/Plan:  1. Prediabetes Alejandro [name] has had elevated A1c in the past but last level was 5.2 in January. He denies polyphagia. Medication(s): Mounjaro 15 mg weekly.  Appetite is well controlled.  However his coupon has likely run out. Lab Results  Component Value Date   HGBA1C 5.2 07/07/2021   Lab Results  Component Value Date   INSULIN 25.5 (H) 02/03/2021   INSULIN 14.2 06/04/2020   INSULIN 7.2 12/10/2019   INSULIN 32.3 (H) 07/31/2019   INSULIN 26.6 (H) 04/09/2019    Plan: Refill Mounjaro 15 mg weekly.  If he is unable to  obtain due to cost, will try to get Ozempic covered.   2.  Anxiety and depression Alejandro [name] reports he is struggling recently.  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  He lost his job in December.  He began seeing a PMHNP Lesle Chris, NP) and counselor after his MI.  He sees the PMHNP and counselor every few months.  He had been going to see his counselor more often.  He has had some family discord with his parents after his MI.  He gets anxious when he gets short of breath more easily after climbing stairs and other activities.  He does not notice this as much when he is active in sports with his sons. Alejandro [name] is very hard on himself and feels he should be " doing better". 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  He is spending lots of time with his sons. Currently prescribed Lexapro 20 mg daily Viibryd 40 mg daily and Xanax 0.25 mg twice daily as needed.  Plan: Reassurance provided that what he is going through post MI is totally normal.  Encouraged him to see his counselor more frequently-I recommended monthly. He will follow-up with Lesle Chris, NP as directed and continue all medications as directed. Encouraged him to begin walking briskly interspersed with some jogging to increase cardio endurance.  I believe if he improves his cardio endurance this will eliminate a source of worry for him.  3. Obesity: Current BMI 31.4 Alejandro [name] is currently in  the action stage of change. As such, his goal is to continue with weight loss efforts.  He has agreed to following a lower carbohydrate, vegetable and lean protein rich diet plan.   Exercise goals: continue yoga, walk briskly 3-4 days per week for 30 minutes.  Behavioral modification strategies: planning for success.  Alejandro [name] has agreed to follow-up with our clinic in 3 weeks.   No orders of the defined types were placed in this encounter.   There are no discontinued medications.   No orders of  the defined types were placed in this encounter.     Objective:   VITALS: Per patient if applicable, see vitals. GENERAL: Alert and in no acute distress. CARDIOPULMONARY: No increased WOB. Speaking in clear sentences.  PSYCH: Pleasant and cooperative. Speech normal rate and rhythm. Affect is appropriate. Insight and judgement are appropriate. Attention is focused, linear, and appropriate.  NEURO: Oriented as arrived to appointment on time with no prompting.   Lab Results  Component Value Date   CREATININE 0.89 07/07/2021   BUN 11 07/07/2021   NA 136 07/07/2021   K 4.3 07/07/2021   CL 103 07/07/2021   CO2 24 07/07/2021   Lab Results  Component Value Date   ALT 35 10/15/2021   AST 23 10/15/2021   ALKPHOS 55 10/15/2021   BILITOT 0.5 10/15/2021   Lab Results  Component Value Date   HGBA1C 5.2 07/07/2021   HGBA1C 5.8 (H) 02/03/2021   HGBA1C 5.3 06/04/2020   HGBA1C 5.2 12/10/2019   HGBA1C 5.7 (H) 07/31/2019   Lab Results  Component Value Date   INSULIN 25.5 (H) 02/03/2021   INSULIN 14.2 06/04/2020   INSULIN 7.2 12/10/2019   INSULIN 32.3 (H) 07/31/2019   INSULIN 26.6 (H) 04/09/2019   Lab Results  Component Value Date   TSH 1.830 04/09/2019   Lab Results  Component Value Date   CHOL 159 10/15/2021   HDL 63 10/15/2021   LDLCALC 82 10/15/2021   LDLDIRECT 224.0 08/26/2017   TRIG 69 10/15/2021   CHOLHDL 2.5 10/15/2021   Lab Results  Component Value Date   WBC 6.4 07/07/2021   HGB 17.0 07/07/2021   HCT 49.6 07/07/2021   MCV 86.6 07/07/2021   PLT 494 (H) 07/07/2021   No results found for: "IRON", "TIBC", "FERRITIN" Lab Results  Component Value Date   VD25OH 33.5 02/03/2021   VD25OH 24.8 (L) 06/04/2020   VD25OH 58.2 12/10/2019    Attestation Statements:   Reviewed by clinician on day of visit: allergies, medications, problem list, medical history, surgical history, family history, social history, and previous encounter notes.

## 2021-11-30 ENCOUNTER — Other Ambulatory Visit (HOSPITAL_COMMUNITY): Payer: Self-pay

## 2021-11-30 ENCOUNTER — Encounter (INDEPENDENT_AMBULATORY_CARE_PROVIDER_SITE_OTHER): Payer: Self-pay | Admitting: Family Medicine

## 2021-11-30 ENCOUNTER — Telehealth (INDEPENDENT_AMBULATORY_CARE_PROVIDER_SITE_OTHER): Payer: Commercial Managed Care - PPO | Admitting: Family Medicine

## 2021-11-30 DIAGNOSIS — F32A Depression, unspecified: Secondary | ICD-10-CM

## 2021-11-30 DIAGNOSIS — F419 Anxiety disorder, unspecified: Secondary | ICD-10-CM | POA: Diagnosis not present

## 2021-11-30 DIAGNOSIS — Z6831 Body mass index (BMI) 31.0-31.9, adult: Secondary | ICD-10-CM

## 2021-11-30 DIAGNOSIS — R7303 Prediabetes: Secondary | ICD-10-CM | POA: Diagnosis not present

## 2021-11-30 DIAGNOSIS — E669 Obesity, unspecified: Secondary | ICD-10-CM | POA: Diagnosis not present

## 2021-11-30 DIAGNOSIS — E6609 Other obesity due to excess calories: Secondary | ICD-10-CM

## 2021-11-30 DIAGNOSIS — Z7985 Long-term (current) use of injectable non-insulin antidiabetic drugs: Secondary | ICD-10-CM

## 2021-11-30 MED ORDER — MOUNJARO 15 MG/0.5ML ~~LOC~~ SOAJ
15.0000 mg | SUBCUTANEOUS | 0 refills | Status: DC
  Filled 2021-11-30: qty 6, 84d supply, fill #0
  Filled 2021-11-30: qty 2, 28d supply, fill #0
  Filled 2021-12-12: qty 6, 84d supply, fill #0

## 2021-12-12 ENCOUNTER — Other Ambulatory Visit: Payer: Self-pay | Admitting: Behavioral Health

## 2021-12-12 ENCOUNTER — Other Ambulatory Visit (HOSPITAL_COMMUNITY): Payer: Self-pay

## 2021-12-12 DIAGNOSIS — F411 Generalized anxiety disorder: Secondary | ICD-10-CM

## 2021-12-14 ENCOUNTER — Other Ambulatory Visit (HOSPITAL_COMMUNITY): Payer: Self-pay

## 2021-12-14 MED ORDER — ALPRAZOLAM 0.25 MG PO TABS
0.2500 mg | ORAL_TABLET | Freq: Two times a day (BID) | ORAL | 0 refills | Status: DC | PRN
  Filled 2021-12-14: qty 20, 10d supply, fill #0

## 2021-12-16 ENCOUNTER — Other Ambulatory Visit (HOSPITAL_COMMUNITY): Payer: Self-pay

## 2021-12-21 ENCOUNTER — Other Ambulatory Visit (HOSPITAL_COMMUNITY): Payer: Self-pay

## 2021-12-21 ENCOUNTER — Ambulatory Visit (INDEPENDENT_AMBULATORY_CARE_PROVIDER_SITE_OTHER): Payer: Commercial Managed Care - PPO | Admitting: Nurse Practitioner

## 2021-12-21 VITALS — BP 119/84 | HR 77 | Temp 97.8°F | Ht 68.0 in | Wt 213.0 lb

## 2021-12-21 DIAGNOSIS — R7303 Prediabetes: Secondary | ICD-10-CM | POA: Diagnosis not present

## 2021-12-21 DIAGNOSIS — Z6832 Body mass index (BMI) 32.0-32.9, adult: Secondary | ICD-10-CM

## 2021-12-21 DIAGNOSIS — E669 Obesity, unspecified: Secondary | ICD-10-CM | POA: Diagnosis not present

## 2021-12-21 DIAGNOSIS — E6609 Other obesity due to excess calories: Secondary | ICD-10-CM

## 2021-12-21 MED ORDER — TIRZEPATIDE 10 MG/0.5ML ~~LOC~~ SOAJ
10.0000 mg | SUBCUTANEOUS | 0 refills | Status: DC
  Filled 2021-12-21 – 2022-01-15 (×4): qty 2, 28d supply, fill #0

## 2021-12-21 NOTE — Progress Notes (Unsigned)
Office: [phone]  /  Fax: [phone]    Date: 01/04/2022   Appointment Start Time: *** Duration: *** minutes Provider: Glennie Isle, Psy.D. Type of Session: Individual Therapy  Location of Patient: {gbptloc:23249} (private location) Location of Provider: Provider's Home (private office) Type of Contact: Telepsychological Visit via MyChart Video Visit  Session Content: Alejandro [name] is a 41 y.o. male presenting for a follow-up appointment to address the previously established treatment goal of increasing coping skills.Today's appointment was a telepsychological visit. Alejandro [name] provided verbal consent for today's telepsychological appointment and he is aware he is responsible for securing confidentiality on his end of the session. Prior to proceeding with today's appointment, Alejandro [name]'s physical location at the time of this appointment was obtained as well a phone number he could be reached at in the event of technical difficulties. Alejandro [name] and this provider participated in today's telepsychological service.   This provider conducted a brief check-in and administered the PHQ-9 and GAD-7. *** Alejandro [name] was receptive to today's appointment as evidenced by openness to sharing, responsiveness to feedback, and {gbreceptiveness:23401}.  Mental Status Examination:  Appearance: {Appearance:22431} Behavior: {Behavior:22445} Mood: {gbmood:21757} Affect: {Affect:22436} Speech: {Speech:22432} Eye Contact: {Eye Contact:22433} Psychomotor Activity: {Motor Activity:22434} Gait: {gbgait:23404} Thought Process: {thought process:22448}  Thought Content/Perception: {disturbances:22451} Orientation: {Orientation:22437} Memory/Concentration: {gbcognition:22449} Insight: {Insight:22446} Judgment: {Insight:22446}  Structured Assessments Results: The Patient Health Questionnaire-9 (PHQ-9) is a self-report measure that assesses symptoms and severity of depression over the course of the last two weeks. Alejandro [name]  obtained a score of *** suggesting {GBPHQ9SEVERITY:21752}. Alejandro [name] finds the endorsed symptoms to be {gbphq9difficulty:21754}. [0= Not at all; 1= Several days; 2= More than half the days; 3= Nearly every day] Little interest or pleasure in doing things ***  Feeling down, depressed, or hopeless ***  Trouble falling or staying asleep, or sleeping too much ***  Feeling tired or having little energy ***  Poor appetite or overeating ***  Feeling bad about yourself --- or that you are a failure or have let yourself or your family down ***  Trouble concentrating on things, such as reading the newspaper or watching television ***  Moving or speaking so slowly that other people could have noticed? Or the opposite --- being so fidgety or restless that you have been moving around a lot more than usual ***  Thoughts that you would be better off dead or hurting yourself in some way ***  PHQ-9 Score ***    The Generalized Anxiety Disorder-7 (GAD-7) is a brief self-report measure that assesses symptoms of anxiety over the course of the last two weeks. Bless obtained a score of *** suggesting {gbgad7severity:21753}. Alejandro [name] finds the endorsed symptoms to be {gbphq9difficulty:21754}. [0= Not at all; 1= Several days; 2= Over half the days; 3= Nearly every day] Feeling nervous, anxious, on edge ***  Not being able to stop or control worrying ***  Worrying too much about different things ***  Trouble relaxing ***  Being so restless that it's hard to sit still ***  Becoming easily annoyed or irritable ***  Feeling afraid as if something awful might happen ***  GAD-7 Score ***   Interventions:  {Interventions for Progress Notes:23405}  DSM-5 Diagnosis(es): {Diagnoses:22752}  Treatment Goal & Progress: During the initial appointment with this provider, the following treatment goal was established: increase coping skills. Alejandro [name] has demonstrated progress in his goal as evidenced by {gbtxprogress:22839}.  Alejandro [name] also {gbtxprogress2:22951}.  Plan: The next appointment is scheduled for *** at ***, which will be via MyChart Video Visit. The next session will  focus on {Plan for Next Appointment:23400}.

## 2021-12-22 ENCOUNTER — Telehealth (INDEPENDENT_AMBULATORY_CARE_PROVIDER_SITE_OTHER): Payer: Self-pay | Admitting: Nurse Practitioner

## 2021-12-22 ENCOUNTER — Encounter (INDEPENDENT_AMBULATORY_CARE_PROVIDER_SITE_OTHER): Payer: Self-pay

## 2021-12-22 NOTE — Telephone Encounter (Signed)
Alejandro [name] - Prior authorization denied for [facility]. Per insurance - Patient does not have type 2 diabetes. Patient sent denial message via mychart.

## 2021-12-22 NOTE — Progress Notes (Signed)
Chief Complaint:   OBESITY Alejandro [name] is here to discuss his progress with his obesity treatment plan along with follow-up of his obesity related diagnoses. Alejandro [name] is on following a lower carbohydrate, vegetable and lean protein rich diet plan and states he is following his eating plan approximately 10% of the time. Alejandro [name] states he is walking and doing Yoga 45 minutes 3 times per week.  Today's visit was #: 29 Starting weight: 254 lbs Starting date: 04/09/2019 Today's weight: 213 lbs Today's date: 12/21/2021 Total lbs lost to date: 41 lbs Total lbs lost since last in-office visit: 0  Interim History: Alejandro [name] is frustrated with weight gain, notes he is tired of battling this for years. He has been at the pool a lot and has been eating his children's left overs. He had got off track due to the 4th of July and traveling for sports. He is traveling a lot for baseball, basketball and football. 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 He has seen Dr. Mallie Mussel in the past.  Subjective:   1. Pre-diabetes Alejandro [name] is currently on Mounjaro 15 mg but has not taken over the past two weeks due to shortage. He is struggling with hunger and cravings.  Assessment/Plan:   1. Pre-diabetes We will refill Mounjaro 10 mg SubQ once weekly for 1 month with 0 refills. Due to shortage, will consider Saxenda.  Side effects discussed  Alejandro [name] will continue to work on weight loss, exercise, and decreasing simple carbohydrates to help decrease the risk of diabetes.    -Refill tirzepatide (MOUNJARO) 10 MG/0.5ML Pen; Inject 10 mg into the skin once a week.  Dispense: 2 mL; Refill: 0  2. Obesity: Current BMI 32.5 Alejandro [name] is currently in the action stage of change. As such, his goal is to continue with weight loss efforts. He has agreed to following a lower carbohydrate, vegetable and lean protein rich diet plan.   Exercise goals: As is.  Handout given: Dining out guide.  Behavioral modification  strategies: increasing water intake, meal planning and cooking strategies, and planning for success.  Refer back to Dr. Mallie Mussel.   Alejandro [name] has agreed to follow-up with our clinic in 3 weeks. He was informed of the importance of frequent follow-up visits to maximize his success with intensive lifestyle modifications for his multiple health conditions.   Objective:   Blood pressure 119/84, pulse 77, temperature 97.8 F (36.6 C), height '5\' 8"'$  (1.727 m), weight 213 lb (96.6 kg), SpO2 96 %. Body mass index is 32.39 kg/m.  General: Cooperative, alert, well developed, in no acute distress. HEENT: Conjunctivae and lids unremarkable. Cardiovascular: Regular rhythm.  Lungs: Normal work of breathing. Neurologic: No focal deficits.   Lab Results  Component Value Date   CREATININE 0.89 07/07/2021   BUN 11 07/07/2021   NA 136 07/07/2021   K 4.3 07/07/2021   CL 103 07/07/2021   CO2 24 07/07/2021   Lab Results  Component Value Date   ALT 35 10/15/2021   AST 23 10/15/2021   ALKPHOS 55 10/15/2021   BILITOT 0.5 10/15/2021   Lab Results  Component Value Date   HGBA1C 5.2 07/07/2021   HGBA1C 5.8 (H) 02/03/2021   HGBA1C 5.3 06/04/2020   HGBA1C 5.2 12/10/2019   HGBA1C 5.7 (H) 07/31/2019   Lab Results  Component Value Date   INSULIN 25.5 (H) 02/03/2021   INSULIN 14.2 06/04/2020   INSULIN 7.2 12/10/2019   INSULIN 32.3 (H) 07/31/2019   INSULIN 26.6 (H) 04/09/2019  Lab Results  Component Value Date   TSH 1.830 04/09/2019   Lab Results  Component Value Date   CHOL 159 10/15/2021   HDL 63 10/15/2021   LDLCALC 82 10/15/2021   LDLDIRECT 224.0 08/26/2017   TRIG 69 10/15/2021   CHOLHDL 2.5 10/15/2021   Lab Results  Component Value Date   VD25OH 33.5 02/03/2021   VD25OH 24.8 (L) 06/04/2020   VD25OH 58.2 12/10/2019   Lab Results  Component Value Date   WBC 6.4 07/07/2021   HGB 17.0 07/07/2021   HCT 49.6 07/07/2021   MCV 86.6 07/07/2021   PLT 494 (H) 07/07/2021   No results  found for: "IRON", "TIBC", "FERRITIN"  Attestation Statements:   Reviewed by clinician on day of visit: allergies, medications, problem list, medical history, surgical history, family history, social history, and previous encounter notes.  I, Brendell Tyus, RMA, am acting as transcriptionist for Everardo Pacific, FNP.  I have reviewed the above documentation for accuracy and completeness, and I agree with the above. Everardo Pacific, FNP

## 2021-12-28 ENCOUNTER — Other Ambulatory Visit (HOSPITAL_COMMUNITY): Payer: Self-pay

## 2021-12-29 ENCOUNTER — Encounter (INDEPENDENT_AMBULATORY_CARE_PROVIDER_SITE_OTHER): Payer: Self-pay | Admitting: Psychology

## 2021-12-29 ENCOUNTER — Encounter (INDEPENDENT_AMBULATORY_CARE_PROVIDER_SITE_OTHER): Payer: Self-pay

## 2022-01-02 ENCOUNTER — Encounter (INDEPENDENT_AMBULATORY_CARE_PROVIDER_SITE_OTHER): Payer: Self-pay | Admitting: Nurse Practitioner

## 2022-01-04 ENCOUNTER — Telehealth (INDEPENDENT_AMBULATORY_CARE_PROVIDER_SITE_OTHER): Payer: Self-pay | Admitting: Psychology

## 2022-01-04 ENCOUNTER — Telehealth (INDEPENDENT_AMBULATORY_CARE_PROVIDER_SITE_OTHER): Payer: Commercial Managed Care - PPO | Admitting: Psychology

## 2022-01-04 NOTE — Telephone Encounter (Signed)
  Office: [phone]  /  Fax: [phone]  Date of Call: January 04, 2022  Time of Call: 9:30am Duration of Call: ~2 minute(s) Provider: Glennie Isle, PsyD  CONTENT: This provider called Alejandro [name] as he was scheduled for a follow-up today at 9:30am. Anchor desk staff Krystal Eaton) attempted to contact Alejandro [name] on different occassions to ensure paperwork for today's appointment was completed. This provider explained to Alejandro [name] the paperwork would need to be completed prior to the follow-up appointment. Alejandro [name] acknowledged understanding and apologized for not completing it in time for today's appointment. This provider discussed the option of paperwork being sent via email today and Alejandro [name] being rescheduled. However, he expressed a plan to stop by the clinic tomorrow to complete the paperwork in-person and reschedule today's appointment at that time. A brief risk assessment was completed. Alejandro [name] denied experiencing suicidal, self-harm, homicidal ideation, plan, or intent. All questions/concerns addressed.   PLAN: Alejandro [name] will stop by the clinic tomorrow to complete paperwork and reschedule his follow-up appointment.

## 2022-01-06 NOTE — Progress Notes (Deleted)
TeleHealth Visit:  This visit was completed with telemedicine (audio/video) technology. Alejandro [name] has verbally consented to this TeleHealth visit. The patient is located at home, the provider is located at home. The participants in this visit include the listed provider and patient. The visit was conducted today via MyChart video.  OBESITY Alejandro [name] is here to discuss his progress with his obesity treatment plan along with follow-up of his obesity related diagnoses.   Today's visit was # 4 Starting weight: 254 lbs Starting date: 04/09/2019 Weight at last in office visit: 213 lbs on 12/21/21 Total weight loss: 41 lbs at last in office visit on 12/21/21. Today's reported weight: *** lbs No weight reported.  Nutrition Plan: following a lower carbohydrate, vegetable and lean protein rich diet plan.  Hunger is {EWCONTROLASSESSMENT:24261}. Cravings are {EWCONTROLASSESSMENT:24261}.  Current exercise: {exercise types:16438}  Interim History: ***  Assessment/Plan:  1. ***  2. ***  3. ***  Obesity: Current BMI *** Alejandro [name] {CHL AMB IS/IS NOT:210130109} currently in the action stage of change. As such, his goal is to {MWMwtloss#1:210800005}.  He has agreed to {MWMwtlossportion/plan2:23431}.   Exercise goals: {MWM EXERCISE RECS:23473}  Behavioral modification strategies: {MWMwtlossdietstrategies3:23432}.  Kizer has agreed to follow-up with our clinic in {NUMBER 1-10:22536} weeks.   No orders of the defined types were placed in this encounter.   There are no discontinued medications.   No orders of the defined types were placed in this encounter.     Objective:   VITALS: Per patient if applicable, see vitals. GENERAL: Alert and in no acute distress. CARDIOPULMONARY: No increased WOB. Speaking in clear sentences.  PSYCH: Pleasant and cooperative. Speech normal rate and rhythm. Affect is appropriate. Insight and judgement are appropriate. Attention is focused, linear, and  appropriate.  NEURO: Oriented as arrived to appointment on time with no prompting.   Lab Results  Component Value Date   CREATININE 0.89 07/07/2021   BUN 11 07/07/2021   NA 136 07/07/2021   K 4.3 07/07/2021   CL 103 07/07/2021   CO2 24 07/07/2021   Lab Results  Component Value Date   ALT 35 10/15/2021   AST 23 10/15/2021   ALKPHOS 55 10/15/2021   BILITOT 0.5 10/15/2021   Lab Results  Component Value Date   HGBA1C 5.2 07/07/2021   HGBA1C 5.8 (H) 02/03/2021   HGBA1C 5.3 06/04/2020   HGBA1C 5.2 12/10/2019   HGBA1C 5.7 (H) 07/31/2019   Lab Results  Component Value Date   INSULIN 25.5 (H) 02/03/2021   INSULIN 14.2 06/04/2020   INSULIN 7.2 12/10/2019   INSULIN 32.3 (H) 07/31/2019   INSULIN 26.6 (H) 04/09/2019   Lab Results  Component Value Date   TSH 1.830 04/09/2019   Lab Results  Component Value Date   CHOL 159 10/15/2021   HDL 63 10/15/2021   LDLCALC 82 10/15/2021   LDLDIRECT 224.0 08/26/2017   TRIG 69 10/15/2021   CHOLHDL 2.5 10/15/2021   Lab Results  Component Value Date   WBC 6.4 07/07/2021   HGB 17.0 07/07/2021   HCT 49.6 07/07/2021   MCV 86.6 07/07/2021   PLT 494 (H) 07/07/2021   No results found for: "IRON", "TIBC", "FERRITIN" Lab Results  Component Value Date   VD25OH 33.5 02/03/2021   VD25OH 24.8 (L) 06/04/2020   VD25OH 58.2 12/10/2019    Attestation Statements:   Reviewed by clinician on day of visit: allergies, medications, problem list, medical history, surgical history, family history, social history, and previous encounter notes.  ***(delete if time-based billing not  used) Time spent on visit including the items listed below was *** minutes.  -preparing to see the patient (e.g., review of tests, history, previous notes) -obtaining and/or reviewing separately obtained history -counseling and educating the patient/family/caregiver -documenting clinical information in the electronic or other health record -ordering medications, tests,  or procedures -independently interpreting results and communicating results to the patient/ family/caregiver -referring and communicating with other health care professionals  -care coordination

## 2022-01-11 ENCOUNTER — Telehealth (INDEPENDENT_AMBULATORY_CARE_PROVIDER_SITE_OTHER): Payer: Commercial Managed Care - PPO | Admitting: Family Medicine

## 2022-01-15 ENCOUNTER — Other Ambulatory Visit (HOSPITAL_COMMUNITY): Payer: Self-pay

## 2022-01-20 ENCOUNTER — Encounter (INDEPENDENT_AMBULATORY_CARE_PROVIDER_SITE_OTHER): Payer: Self-pay

## 2022-01-25 DIAGNOSIS — H6123 Impacted cerumen, bilateral: Secondary | ICD-10-CM | POA: Insufficient documentation

## 2022-02-05 ENCOUNTER — Other Ambulatory Visit (INDEPENDENT_AMBULATORY_CARE_PROVIDER_SITE_OTHER): Payer: Self-pay | Admitting: Nurse Practitioner

## 2022-02-05 DIAGNOSIS — R7303 Prediabetes: Secondary | ICD-10-CM

## 2022-02-08 ENCOUNTER — Other Ambulatory Visit (HOSPITAL_COMMUNITY): Payer: Self-pay

## 2022-02-09 ENCOUNTER — Other Ambulatory Visit (HOSPITAL_COMMUNITY): Payer: Self-pay

## 2022-02-11 ENCOUNTER — Encounter (INDEPENDENT_AMBULATORY_CARE_PROVIDER_SITE_OTHER): Payer: Self-pay | Admitting: Nurse Practitioner

## 2022-02-11 ENCOUNTER — Encounter (INDEPENDENT_AMBULATORY_CARE_PROVIDER_SITE_OTHER): Payer: Self-pay

## 2022-02-11 ENCOUNTER — Ambulatory Visit (INDEPENDENT_AMBULATORY_CARE_PROVIDER_SITE_OTHER): Payer: Commercial Managed Care - PPO | Admitting: Nurse Practitioner

## 2022-02-11 ENCOUNTER — Telehealth (INDEPENDENT_AMBULATORY_CARE_PROVIDER_SITE_OTHER): Payer: Self-pay | Admitting: Nurse Practitioner

## 2022-02-11 ENCOUNTER — Other Ambulatory Visit (HOSPITAL_COMMUNITY): Payer: Self-pay

## 2022-02-11 VITALS — BP 115/81 | HR 77 | Ht 68.0 in | Wt 218.0 lb

## 2022-02-11 DIAGNOSIS — E669 Obesity, unspecified: Secondary | ICD-10-CM

## 2022-02-11 DIAGNOSIS — Z6833 Body mass index (BMI) 33.0-33.9, adult: Secondary | ICD-10-CM

## 2022-02-11 DIAGNOSIS — E6609 Other obesity due to excess calories: Secondary | ICD-10-CM

## 2022-02-11 DIAGNOSIS — R7303 Prediabetes: Secondary | ICD-10-CM

## 2022-02-11 MED ORDER — SAXENDA 18 MG/3ML ~~LOC~~ SOPN
3.0000 mg | PEN_INJECTOR | Freq: Every day | SUBCUTANEOUS | 0 refills | Status: DC
  Filled 2022-02-11: qty 15, 30d supply, fill #0

## 2022-02-11 MED ORDER — INSULIN PEN NEEDLE 31G X 5 MM MISC
0 refills | Status: DC
  Filled 2022-02-11: qty 100, 90d supply, fill #0

## 2022-02-11 NOTE — Telephone Encounter (Signed)
Alejandro [name] - Prior authorization denied for Saxenda. Per insurance: not  covered/plan exclusion. Patient sent denial message via mychart.

## 2022-02-15 NOTE — Progress Notes (Signed)
Chief Complaint:   OBESITY Alejandro [name] is here to discuss his progress with his obesity treatment plan along with follow-up of his obesity related diagnoses. Alejandro [name] is on following a lower carbohydrate, vegetable and lean protein rich diet plan and states he is following his eating plan approximately 10% of the time. Alejandro [name] states he is doing yoga 60 minutes 3 times per week.  Today's visit was #: 74 Starting weight: 254 lbs Starting date: 04/09/2019 Today's weight: 218 lbs Today's date: 0831/2023 Total lbs lost to date: 36 lbs Total lbs lost since last in-office visit: +5 lbs  Interim History: Was seen here last on 12/21/2021 and has gained 5 lbs.  Has been "crazy busy"since his last visit.  Struggling with hunger and cravings since stopping Mounjaro. Does not track, weigh, or measures food.  Is either all or nothing.  Drinking coffee, Gatorade and water.   Subjective:   1. Pre-diabetes Has taken Mounjaro in the past. Struggling with hunger and cravings since stopping Mounjaro.   Assessment/Plan:   1. Pre-diabetes Start Saxenda 0.'6mg'$  x 1 week.  Can increase to 1.'2mg'$  after 1 week if he doesn't have any side effects.  Will discuss dose at next visit.   Start - Liraglutide -Weight Management (SAXENDA) 18 MG/3ML SOPN; Inject 3 mg into the skin daily.  Dispense: 15 mL; Refill: 0  Side effects discussed.  Start - Insulin Pen Needle 31G X 5 MM MISC; Take as directed with Saxenda  Dispense: 100 each; Refill: 0  2. Obesity: Current BMI 33.2 Side effects discussed  Start - Liraglutide -Weight Management (SAXENDA) 18 MG/3ML SOPN; Inject 3 mg into the skin daily.  Dispense: 15 mL; Refill: 0  Start - Insulin Pen Needle 31G X 5 MM MISC; Take as directed with Saxenda  Dispense: 100 each; Refill: 0  Alejandro [name] is currently in the action stage of change. As such, his goal is to continue with weight loss efforts. He has agreed to following a lower carbohydrate, vegetable and lean protein rich  diet plan.   Exercise goals:  will log using my fitness pal to review at next visit.   Behavioral modification strategies: increasing lean protein intake, increasing vegetables, and increasing water intake.  Alejandro [name] has agreed to follow-up with our clinic in 2 weeks. He was informed of the importance of frequent follow-up visits to maximize his success with intensive lifestyle modifications for his multiple health conditions.   Objective:   Blood pressure 115/81, pulse 77, height '5\' 8"'$  (1.727 m), weight 218 lb (98.9 kg), SpO2 96 %. Body mass index is 33.15 kg/m.  General: Cooperative, alert, well developed, in no acute distress. HEENT: Conjunctivae and lids unremarkable. Cardiovascular: Regular rhythm.  Lungs: Normal work of breathing. Neurologic: No focal deficits.   Lab Results  Component Value Date   CREATININE 0.89 07/07/2021   BUN 11 07/07/2021   NA 136 07/07/2021   K 4.3 07/07/2021   CL 103 07/07/2021   CO2 24 07/07/2021   Lab Results  Component Value Date   ALT 35 10/15/2021   AST 23 10/15/2021   ALKPHOS 55 10/15/2021   BILITOT 0.5 10/15/2021   Lab Results  Component Value Date   HGBA1C 5.2 07/07/2021   HGBA1C 5.8 (H) 02/03/2021   HGBA1C 5.3 06/04/2020   HGBA1C 5.2 12/10/2019   HGBA1C 5.7 (H) 07/31/2019   Lab Results  Component Value Date   INSULIN 25.5 (H) 02/03/2021   INSULIN 14.2 06/04/2020   INSULIN 7.2 12/10/2019  INSULIN 32.3 (H) 07/31/2019   INSULIN 26.6 (H) 04/09/2019   Lab Results  Component Value Date   TSH 1.830 04/09/2019   Lab Results  Component Value Date   CHOL 159 10/15/2021   HDL 63 10/15/2021   LDLCALC 82 10/15/2021   LDLDIRECT 224.0 08/26/2017   TRIG 69 10/15/2021   CHOLHDL 2.5 10/15/2021   Lab Results  Component Value Date   VD25OH 33.5 02/03/2021   VD25OH 24.8 (L) 06/04/2020   VD25OH 58.2 12/10/2019   Lab Results  Component Value Date   WBC 6.4 07/07/2021   HGB 17.0 07/07/2021   HCT 49.6 07/07/2021   MCV 86.6  07/07/2021   PLT 494 (H) 07/07/2021   No results found for: "IRON", "TIBC", "FERRITIN"  Attestation Statements:   Reviewed by clinician on day of visit: allergies, medications, problem list, medical history, surgical history, family history, social history, and previous encounter notes.  I, Davy Pique, RMA, am acting as transcriptionist for Everardo Pacific, FNP  I have reviewed the above documentation for accuracy and completeness, and I agree with the above. Everardo Pacific, FNP

## 2022-02-16 ENCOUNTER — Other Ambulatory Visit (HOSPITAL_COMMUNITY): Payer: Self-pay

## 2022-02-16 ENCOUNTER — Ambulatory Visit (INDEPENDENT_AMBULATORY_CARE_PROVIDER_SITE_OTHER): Payer: Commercial Managed Care - PPO | Admitting: Family Medicine

## 2022-02-16 ENCOUNTER — Encounter: Payer: Self-pay | Admitting: Family Medicine

## 2022-02-16 VITALS — BP 114/78 | HR 94 | Temp 97.8°F | Ht 68.0 in | Wt 220.0 lb

## 2022-02-16 DIAGNOSIS — J029 Acute pharyngitis, unspecified: Secondary | ICD-10-CM | POA: Diagnosis not present

## 2022-02-16 LAB — POCT RAPID STREP A (OFFICE): Rapid Strep A Screen: POSITIVE — AB

## 2022-02-16 LAB — POC COVID19 BINAXNOW: SARS Coronavirus 2 Ag: NEGATIVE

## 2022-02-16 MED ORDER — AMOXICILLIN 875 MG PO TABS
875.0000 mg | ORAL_TABLET | Freq: Two times a day (BID) | ORAL | 0 refills | Status: DC
  Filled 2022-02-16: qty 20, 10d supply, fill #0

## 2022-02-16 NOTE — Progress Notes (Unsigned)
No sick contacts at home but had sick coworkers.  Sx started 02/13/22.  1st sx was ST.  No known fevers, but felt cold last night.  Some cough, some sputum.  Can take a deep breath.  Voice is slightly hoarse.  Pain with swallowing but no dysphagia.  Ear pain B recently.  Some facial pressure.  No vomiting, no diarrhea.  Decreased sense of smell.  He had covid prev.  He was prev vaccinated.    Meds, vitals, and allergies reviewed.   ROS: Per HPI unless specifically indicated in ROS section   GEN: nad, alert and oriented HEENT: mucous membranes moist, tm w/o erythema, nasal exam w/o erythema, clear discharge noted,  OP with cobblestoning and bilateral tonsillar enlargement. NECK: supple w/ tender LA CV: rrr.   PULM: ctab, no inc wob EXT: no edema SKIN: Well-perfused.  COVID test negative.  Strep test positive.  Discussed with patient at office visit.

## 2022-02-16 NOTE — Patient Instructions (Signed)
Strep positive.  Covid negative.   Rest and fluids.   Gargle with salt water.   Start amoxil.  Return to work when feeling better.  Take care.  Glad to see you.

## 2022-02-17 DIAGNOSIS — J029 Acute pharyngitis, unspecified: Secondary | ICD-10-CM | POA: Insufficient documentation

## 2022-02-17 NOTE — Assessment & Plan Note (Signed)
Strep positive.  Covid negative.   Rest and fluids.   Gargle with salt water.   Start amoxil.  Return to work when feeling better.  Routine cautions given to patient.  Okay for outpatient follow-up.

## 2022-02-19 ENCOUNTER — Telehealth: Payer: Self-pay | Admitting: Family Medicine

## 2022-02-19 ENCOUNTER — Other Ambulatory Visit (HOSPITAL_COMMUNITY): Payer: Self-pay

## 2022-02-19 MED ORDER — AMOXICILLIN-POT CLAVULANATE 875-125 MG PO TABS
1.0000 | ORAL_TABLET | Freq: Two times a day (BID) | ORAL | 0 refills | Status: DC
  Filled 2022-02-19: qty 20, 10d supply, fill #0

## 2022-02-19 NOTE — Telephone Encounter (Signed)
Patient called and stated that he feels nothing has changed but no new symptoms and wanted to know if he can get a stronger medication.

## 2022-02-19 NOTE — Telephone Encounter (Signed)
I would stop amoxil/change to augmentin in case he has developed otitis concurrently.  Rx sent.  Would need to get rechecked if progressive ear pain in spite of that.  And I would continue to use nyquil for the cough.  Thanks.

## 2022-02-19 NOTE — Telephone Encounter (Signed)
Relayed Dr. Carole Civil message in a VM per pt's DPR.

## 2022-02-19 NOTE — Telephone Encounter (Signed)
Spoke to patient by telephone and was advised that he was in the office Tuesday and diagnosed with strep. Patient stated that he started the amoxacillin Tuesday. Patient stated that Dr. Damita Dunnings told  him that he should be feeling much better in a couple of days. Patient stated that his sore throat is much better but he has a bad cough. Patient stated that he feels that he had an ear infection too. Patient stated that he has been taking Nyquil at night and that helps the cough. Patient stated that he has a lot of ear pain level 8 at times and it feels full of fluid. Patient stated that he has a history of his ear rupturing.  Patient wants to know if there is anything else he should do or just give it time. Pharmacy [facility]

## 2022-02-22 ENCOUNTER — Other Ambulatory Visit (HOSPITAL_COMMUNITY): Payer: Self-pay

## 2022-02-24 NOTE — Progress Notes (Signed)
TeleHealth Visit:  This visit was completed with telemedicine (audio/video) technology. Alejandro [name] has verbally consented to this TeleHealth visit. The patient is located at home, the provider is located at home. The participants in this visit include the listed provider and patient. The visit was conducted today via MyChart video.  OBESITY Alejandro [name] is here to discuss his progress with his obesity treatment plan along with follow-up of his obesity related diagnoses.   Today's visit was # 25 Starting weight: 254 lbs Starting date: 04/09/2019 Weight at last in office visit: 218 lbs on 02/11/22 Total weight loss: 36 lbs at last in office visit on 02/11/22. Today's reported weight: 220 lbs at PCP on 02/16/22.  Nutrition Plan: following a lower carbohydrate, vegetable and lean protein rich diet plan.   Current exercise:  yoga 1-2 times per week.  Interim History: Alejandro [name] feels out of control with his eating right now.  He is off of the [facility] because his coupon ran out and his insurance does not cover antiobesity medications.  He notes increased hunger.  He is having a lot of stress in his life currently-new job, IT trainer activities, financial issues. He would like to get back to more controlled eating and increase his physical activity.  He would like to do virtual visits only but come into the office to weigh before his visits. Labs to be done before his next visit. Assessment/Plan:  1. Prediabetes Last A1c was 5.2 on 07/07/2021.  He was taking Mounjaro at this time. Medication(s): none  Endorses polyphagia. He has been off of Mounjaro since July since his coupon ran out.  His health insurance does not cover antiobesity medications. Lab Results  Component Value Date   HGBA1C 5.2 07/07/2021   Lab Results  Component Value Date   INSULIN 25.5 (H) 02/03/2021   INSULIN 14.2 06/04/2020   INSULIN 7.2 12/10/2019   INSULIN 32.3 (H) 07/31/2019   INSULIN 26.6 (H) 04/09/2019     Plan: New prescription-metformin 500 mg daily with breakfast. Check CMET, A1c, fasting insulin before next visit.   2. Vitamin D Deficiency Vitamin D is not at goal of 50.  Last vitamin D was 33.5 on 02/03/2021. He is on weekly prescription Vitamin D 50,000 IU.  Lab Results  Component Value Date   VD25OH 33.5 02/03/2021   VD25OH 24.8 (L) 06/04/2020   VD25OH 58.2 12/10/2019    Plan: Refill prescription vitamin D 50,000 IU weekly. Check vitamin D before next visit.   3. Obesity: Current BMI 33.2 Alejandro [name] is currently in the action stage of change. As such, his goal is to continue with weight loss efforts.  He has agreed to the Category 4 Plan.   Category 4 meal plan sent via MyChart along with breakfast and lunch options.  Exercise goals: Try to add in some walking.  Behavioral modification strategies: increasing lean protein intake, decreasing simple carbohydrates, and planning for success.  Alejandro [name] has agreed to follow-up with our clinic in 2 weeks.   Orders Placed This Encounter  Procedures   Comprehensive metabolic panel   Hemoglobin A1c   Insulin, random   VITAMIN D 25 Hydroxy (Vit-D Deficiency, Fractures)    Medications Discontinued During This Encounter  Medication Reason   Insulin Pen Needle 31G X 5 MM MISC Discontinued by provider     Meds ordered this encounter  Medications   metFORMIN (GLUCOPHAGE) 500 MG tablet    Sig: Take 1 tablet (500 mg total) by mouth daily with breakfast.    Dispense:  90 tablet    Refill:  0    Order Specific Question:   Supervising Provider    Answer:   Dennard Nip D [[id number]]      Objective:   VITALS: Per patient if applicable, see vitals. GENERAL: Alert and in no acute distress. CARDIOPULMONARY: No increased WOB. Speaking in clear sentences.  PSYCH: Pleasant and cooperative. Speech normal rate and rhythm. Affect is appropriate. Insight and judgement are appropriate. Attention is focused, linear, and appropriate.   NEURO: Oriented as arrived to appointment on time with no prompting.   Lab Results  Component Value Date   CREATININE 0.89 07/07/2021   BUN 11 07/07/2021   NA 136 07/07/2021   K 4.3 07/07/2021   CL 103 07/07/2021   CO2 24 07/07/2021   Lab Results  Component Value Date   ALT 35 10/15/2021   AST 23 10/15/2021   ALKPHOS 55 10/15/2021   BILITOT 0.5 10/15/2021   Lab Results  Component Value Date   HGBA1C 5.2 07/07/2021   HGBA1C 5.8 (H) 02/03/2021   HGBA1C 5.3 06/04/2020   HGBA1C 5.2 12/10/2019   HGBA1C 5.7 (H) 07/31/2019   Lab Results  Component Value Date   INSULIN 25.5 (H) 02/03/2021   INSULIN 14.2 06/04/2020   INSULIN 7.2 12/10/2019   INSULIN 32.3 (H) 07/31/2019   INSULIN 26.6 (H) 04/09/2019   Lab Results  Component Value Date   TSH 1.830 04/09/2019   Lab Results  Component Value Date   CHOL 159 10/15/2021   HDL 63 10/15/2021   LDLCALC 82 10/15/2021   LDLDIRECT 224.0 08/26/2017   TRIG 69 10/15/2021   CHOLHDL 2.5 10/15/2021   Lab Results  Component Value Date   WBC 6.4 07/07/2021   HGB 17.0 07/07/2021   HCT 49.6 07/07/2021   MCV 86.6 07/07/2021   PLT 494 (H) 07/07/2021   No results found for: "IRON", "TIBC", "FERRITIN" Lab Results  Component Value Date   VD25OH 33.5 02/03/2021   VD25OH 24.8 (L) 06/04/2020   VD25OH 58.2 12/10/2019    Attestation Statements:   Reviewed by clinician on day of visit: allergies, medications, problem list, medical history, surgical history, family history, social history, and previous encounter notes.

## 2022-02-25 ENCOUNTER — Encounter (INDEPENDENT_AMBULATORY_CARE_PROVIDER_SITE_OTHER): Payer: Self-pay | Admitting: Family Medicine

## 2022-02-25 ENCOUNTER — Other Ambulatory Visit (HOSPITAL_COMMUNITY): Payer: Self-pay

## 2022-02-25 ENCOUNTER — Telehealth (INDEPENDENT_AMBULATORY_CARE_PROVIDER_SITE_OTHER): Payer: Commercial Managed Care - PPO | Admitting: Family Medicine

## 2022-02-25 DIAGNOSIS — E669 Obesity, unspecified: Secondary | ICD-10-CM | POA: Diagnosis not present

## 2022-02-25 DIAGNOSIS — R7303 Prediabetes: Secondary | ICD-10-CM | POA: Diagnosis not present

## 2022-02-25 DIAGNOSIS — Z6833 Body mass index (BMI) 33.0-33.9, adult: Secondary | ICD-10-CM | POA: Diagnosis not present

## 2022-02-25 DIAGNOSIS — E559 Vitamin D deficiency, unspecified: Secondary | ICD-10-CM

## 2022-02-25 DIAGNOSIS — E6609 Other obesity due to excess calories: Secondary | ICD-10-CM

## 2022-02-25 MED ORDER — METFORMIN HCL 500 MG PO TABS
500.0000 mg | ORAL_TABLET | Freq: Every day | ORAL | 0 refills | Status: DC
  Filled 2022-02-25: qty 30, 30d supply, fill #0

## 2022-03-04 ENCOUNTER — Other Ambulatory Visit (HOSPITAL_COMMUNITY): Payer: Self-pay

## 2022-03-04 ENCOUNTER — Other Ambulatory Visit (INDEPENDENT_AMBULATORY_CARE_PROVIDER_SITE_OTHER): Payer: Self-pay | Admitting: Nurse Practitioner

## 2022-03-04 DIAGNOSIS — R7303 Prediabetes: Secondary | ICD-10-CM

## 2022-03-05 ENCOUNTER — Other Ambulatory Visit (INDEPENDENT_AMBULATORY_CARE_PROVIDER_SITE_OTHER): Payer: Self-pay | Admitting: Family Medicine

## 2022-03-05 ENCOUNTER — Other Ambulatory Visit (HOSPITAL_COMMUNITY): Payer: Self-pay

## 2022-03-05 DIAGNOSIS — R7303 Prediabetes: Secondary | ICD-10-CM

## 2022-03-08 ENCOUNTER — Other Ambulatory Visit (INDEPENDENT_AMBULATORY_CARE_PROVIDER_SITE_OTHER): Payer: Self-pay | Admitting: Family Medicine

## 2022-03-08 ENCOUNTER — Other Ambulatory Visit (HOSPITAL_COMMUNITY): Payer: Self-pay

## 2022-03-08 DIAGNOSIS — R7303 Prediabetes: Secondary | ICD-10-CM

## 2022-03-09 ENCOUNTER — Other Ambulatory Visit (HOSPITAL_COMMUNITY): Payer: Self-pay

## 2022-03-09 MED ORDER — TIRZEPATIDE 5 MG/0.5ML ~~LOC~~ SOAJ
5.0000 mg | SUBCUTANEOUS | 0 refills | Status: DC
  Filled 2022-03-09: qty 2, 28d supply, fill #0

## 2022-03-10 NOTE — Progress Notes (Signed)
TeleHealth Visit:  This visit was completed with telemedicine (audio/video) technology. Alejandro [name] has verbally consented to this TeleHealth visit. The patient is located at home, the provider is located at home. The participants in this visit include the listed provider and patient. The visit was conducted today via MyChart video.  OBESITY Alejandro [name] is here to discuss his progress with his obesity treatment plan along with follow-up of his obesity related diagnoses.    Today's visit was # 61  Starting weight: 254 lbs Starting date: 04/09/2019 Weight at last in office visit: 218 lbs on 02/11/22 Total weight loss: 36 lbs at last in office visit on 02/11/22. Weight reported at last virtual office visit: 220 lbs Today's reported weight: *** lbs No weight reported. Total weight loss: *** Weight change since last visit: ***  Nutrition Plan: the Category 4 Plan.  Hunger is {EWCONTROLASSESSMENT:24261}. Cravings are {EWCONTROLASSESSMENT:24261}.  Current exercise: {exercise types:16438}  Interim History: ***  Assessment/Plan:  1. ***  2. ***  3. ***  Obesity: Current BMI *** Alejandro [name] {CHL AMB IS/IS NOT:210130109} currently in the action stage of change. As such, his goal is to {MWMwtloss#1:210800005}.  He has agreed to {MWMwtlossportion/plan2:23431}.   Exercise goals: {MWM EXERCISE RECS:23473}  Behavioral modification strategies: {MWMwtlossdietstrategies3:23432}.  Alejandro [name] has agreed to follow-up with our clinic in {NUMBER 1-10:22536} weeks.   No orders of the defined types were placed in this encounter.   There are no discontinued medications.   No orders of the defined types were placed in this encounter.     Objective:   VITALS: Per patient if applicable, see vitals. GENERAL: Alert and in no acute distress. CARDIOPULMONARY: No increased WOB. Speaking in clear sentences.  PSYCH: Pleasant and cooperative. Speech normal rate and rhythm. Affect is appropriate. Insight  and judgement are appropriate. Attention is focused, linear, and appropriate.  NEURO: Oriented as arrived to appointment on time with no prompting.   Lab Results  Component Value Date   CREATININE 0.89 07/07/2021   BUN 11 07/07/2021   NA 136 07/07/2021   K 4.3 07/07/2021   CL 103 07/07/2021   CO2 24 07/07/2021   Lab Results  Component Value Date   ALT 35 10/15/2021   AST 23 10/15/2021   ALKPHOS 55 10/15/2021   BILITOT 0.5 10/15/2021   Lab Results  Component Value Date   HGBA1C 5.2 07/07/2021   HGBA1C 5.8 (H) 02/03/2021   HGBA1C 5.3 06/04/2020   HGBA1C 5.2 12/10/2019   HGBA1C 5.7 (H) 07/31/2019   Lab Results  Component Value Date   INSULIN 25.5 (H) 02/03/2021   INSULIN 14.2 06/04/2020   INSULIN 7.2 12/10/2019   INSULIN 32.3 (H) 07/31/2019   INSULIN 26.6 (H) 04/09/2019   Lab Results  Component Value Date   TSH 1.830 04/09/2019   Lab Results  Component Value Date   CHOL 159 10/15/2021   HDL 63 10/15/2021   LDLCALC 82 10/15/2021   LDLDIRECT 224.0 08/26/2017   TRIG 69 10/15/2021   CHOLHDL 2.5 10/15/2021   Lab Results  Component Value Date   WBC 6.4 07/07/2021   HGB 17.0 07/07/2021   HCT 49.6 07/07/2021   MCV 86.6 07/07/2021   PLT 494 (H) 07/07/2021   No results found for: "IRON", "TIBC", "FERRITIN" Lab Results  Component Value Date   VD25OH 33.5 02/03/2021   VD25OH 24.8 (L) 06/04/2020   VD25OH 58.2 12/10/2019    Attestation Statements:   Reviewed by clinician on day of visit: allergies, medications, problem list, medical history, surgical history,  family history, social history, and previous encounter notes.  ***(delete if time-based billing not used) Time spent on visit including the items listed below was *** minutes.  -preparing to see the patient (e.g., review of tests, history, previous notes) -obtaining and/or reviewing separately obtained history -counseling and educating the patient/family/caregiver -documenting clinical information in the  electronic or other health record -ordering medications, tests, or procedures -independently interpreting results and communicating results to the patient/ family/caregiver -referring and communicating with other health care professionals  -care coordination

## 2022-03-11 ENCOUNTER — Encounter (INDEPENDENT_AMBULATORY_CARE_PROVIDER_SITE_OTHER): Payer: Self-pay | Admitting: Family Medicine

## 2022-03-11 ENCOUNTER — Telehealth (INDEPENDENT_AMBULATORY_CARE_PROVIDER_SITE_OTHER): Payer: Commercial Managed Care - PPO | Admitting: Family Medicine

## 2022-03-11 DIAGNOSIS — R7303 Prediabetes: Secondary | ICD-10-CM

## 2022-03-11 DIAGNOSIS — F32A Depression, unspecified: Secondary | ICD-10-CM

## 2022-03-11 DIAGNOSIS — E6609 Other obesity due to excess calories: Secondary | ICD-10-CM | POA: Diagnosis not present

## 2022-03-11 DIAGNOSIS — F419 Anxiety disorder, unspecified: Secondary | ICD-10-CM | POA: Diagnosis not present

## 2022-03-11 DIAGNOSIS — Z6833 Body mass index (BMI) 33.0-33.9, adult: Secondary | ICD-10-CM

## 2022-03-17 ENCOUNTER — Other Ambulatory Visit: Payer: Self-pay | Admitting: Behavioral Health

## 2022-03-17 DIAGNOSIS — F5105 Insomnia due to other mental disorder: Secondary | ICD-10-CM

## 2022-03-18 ENCOUNTER — Other Ambulatory Visit (HOSPITAL_COMMUNITY): Payer: Self-pay

## 2022-03-18 MED ORDER — TRAZODONE HCL 50 MG PO TABS
50.0000 mg | ORAL_TABLET | Freq: Every day | ORAL | 0 refills | Status: DC
  Filled 2022-03-18: qty 30, 30d supply, fill #0

## 2022-03-18 NOTE — Telephone Encounter (Signed)
Please schedule appt

## 2022-03-19 ENCOUNTER — Other Ambulatory Visit: Payer: Self-pay | Admitting: Behavioral Health

## 2022-03-19 ENCOUNTER — Other Ambulatory Visit (HOSPITAL_COMMUNITY): Payer: Self-pay

## 2022-03-19 DIAGNOSIS — F411 Generalized anxiety disorder: Secondary | ICD-10-CM

## 2022-03-19 MED ORDER — ALPRAZOLAM 0.25 MG PO TABS
0.2500 mg | ORAL_TABLET | Freq: Two times a day (BID) | ORAL | 0 refills | Status: DC | PRN
  Filled 2022-03-19: qty 20, 10d supply, fill #0

## 2022-03-24 ENCOUNTER — Encounter (INDEPENDENT_AMBULATORY_CARE_PROVIDER_SITE_OTHER): Payer: Self-pay

## 2022-03-24 NOTE — Progress Notes (Addendum)
TeleHealth Visit:  This visit was completed with telemedicine (audio/video) technology. Alejandro [name] has verbally consented to this TeleHealth visit. The patient is located at home, the provider is located at home. The participants in this visit include the listed provider and patient. The visit was conducted today via MyChart video.  OBESITY Alejandro [name] is here to discuss his progress with his obesity treatment plan along with follow-up of his obesity related diagnoses.    Today's visit was # 35  Starting weight: 254 lbs Starting date: 04/09/2019 Today's reported weight: 218 lbs  Last reported weight: Reports he was really around 225 at our last visit. Total weight loss: 29 lbs Weight change since last visit: -7  Nutrition Plan: the Category 4 Plan - 75-80%  Current exercise:  yoga 1-2 times per week.  Walking more at ball practice and games, taking dogs for walks.   Interim History: Alejandro [name] reports he is doing very well on the category 4 plan.  He likes it because its lower carb but he can still have a sandwich.  He is sitting in more walking throughout the week.  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 Reports protein is good and water intake is better than it was. He is thrilled with the help that the [facility] provides. Assessment/Plan:  1. Prediabetes A1c has been in prediabetic range in the past but is currently 5.2. Medication(s): Mounjaro 5 mg weekly.  Appetite fairly well controlled.  He would like to increase the dose. Lab Results  Component Value Date   HGBA1C 5.2 07/07/2021   Lab Results  Component Value Date   INSULIN 25.5 (H) 02/03/2021   INSULIN 14.2 06/04/2020   INSULIN 7.2 12/10/2019   INSULIN 32.3 (H) 07/31/2019   INSULIN 26.6 (H) 04/09/2019    Plan: Refill and increase dose-Mounjaro 7.5 mg weekly. He will have fasting labs drawn before next visit.   2. Vitamin D Deficiency Vitamin D is not at goal  of 50.  Last vitamin D level was low at 33.5. He is on weekly prescription Vitamin D 50,000 IU.  Lab Results  Component Value Date   VD25OH 33.5 02/03/2021   VD25OH 24.8 (L) 06/04/2020   VD25OH 58.2 12/10/2019    Plan: Continue prescription vitamin D 50,000 IU weekly.  3. Obesity: Current BMI 33.2 Alejandro [name] is currently in the action stage of change. As such, his goal is to continue with weight loss efforts.  He has agreed to the Category 4 Plan.   Exercise goals: as is  Behavioral modification strategies: increasing lean protein intake, decreasing simple carbohydrates, decreasing eating out, meal planning and cooking strategies, and planning for success.  Alejandro [name] has agreed to follow-up with our clinic in 4 weeks.   No orders of the defined types were placed in this encounter.   Medications Discontinued During This Encounter  Medication Reason   tirzepatide [facility]) 5 MG/0.5ML Pen Dose change     Meds ordered this encounter  Medications   tirzepatide (MOUNJARO) 7.5 MG/0.5ML Pen    Sig: Inject 7.5 mg into the skin once a week.    Dispense:  2 mL    Refill:  0    Order Specific Question:   Supervising Provider    Answer:   Dell Ponto [[id number]]      Objective:   VITALS: Per patient if applicable, see vitals. GENERAL: Alert and in no acute distress. CARDIOPULMONARY: No increased WOB. Speaking in clear sentences.  PSYCH: Pleasant and cooperative. Speech normal rate and rhythm. Affect is appropriate. Insight and judgement are appropriate. Attention is focused, linear, and appropriate.  NEURO: Oriented as arrived to appointment on time with no prompting.   Lab Results  Component Value Date   CREATININE 0.89 07/07/2021   BUN 11 07/07/2021   NA 136 07/07/2021   K 4.3 07/07/2021   CL 103 07/07/2021   CO2 24 07/07/2021   Lab Results  Component Value Date   ALT 35 10/15/2021   AST 23 10/15/2021   ALKPHOS 55 10/15/2021   BILITOT 0.5 10/15/2021   Lab Results   Component Value Date   HGBA1C 5.2 07/07/2021   HGBA1C 5.8 (H) 02/03/2021   HGBA1C 5.3 06/04/2020   HGBA1C 5.2 12/10/2019   HGBA1C 5.7 (H) 07/31/2019   Lab Results  Component Value Date   INSULIN 25.5 (H) 02/03/2021   INSULIN 14.2 06/04/2020   INSULIN 7.2 12/10/2019   INSULIN 32.3 (H) 07/31/2019   INSULIN 26.6 (H) 04/09/2019   Lab Results  Component Value Date   TSH 1.830 04/09/2019   Lab Results  Component Value Date   CHOL 159 10/15/2021   HDL 63 10/15/2021   LDLCALC 82 10/15/2021   LDLDIRECT 224.0 08/26/2017   TRIG 69 10/15/2021   CHOLHDL 2.5 10/15/2021   Lab Results  Component Value Date   WBC 6.4 07/07/2021   HGB 17.0 07/07/2021   HCT 49.6 07/07/2021   MCV 86.6 07/07/2021   PLT 494 (H) 07/07/2021   No results found for: "IRON", "TIBC", "FERRITIN" Lab Results  Component Value Date   VD25OH 33.5 02/03/2021   VD25OH 24.8 (L) 06/04/2020   VD25OH 58.2 12/10/2019    Attestation Statements:   Reviewed by clinician on day of visit: allergies, medications, problem list, medical history, surgical history, family history, social history, and previous encounter notes.

## 2022-03-25 ENCOUNTER — Telehealth (INDEPENDENT_AMBULATORY_CARE_PROVIDER_SITE_OTHER): Payer: Commercial Managed Care - PPO | Admitting: Family Medicine

## 2022-03-25 ENCOUNTER — Other Ambulatory Visit (HOSPITAL_COMMUNITY): Payer: Self-pay

## 2022-03-25 ENCOUNTER — Encounter (INDEPENDENT_AMBULATORY_CARE_PROVIDER_SITE_OTHER): Payer: Self-pay | Admitting: Family Medicine

## 2022-03-25 DIAGNOSIS — E559 Vitamin D deficiency, unspecified: Secondary | ICD-10-CM

## 2022-03-25 DIAGNOSIS — R7303 Prediabetes: Secondary | ICD-10-CM

## 2022-03-25 DIAGNOSIS — Z6833 Body mass index (BMI) 33.0-33.9, adult: Secondary | ICD-10-CM | POA: Diagnosis not present

## 2022-03-25 DIAGNOSIS — E669 Obesity, unspecified: Secondary | ICD-10-CM | POA: Diagnosis not present

## 2022-03-25 DIAGNOSIS — E6609 Other obesity due to excess calories: Secondary | ICD-10-CM

## 2022-03-25 MED ORDER — TIRZEPATIDE 7.5 MG/0.5ML ~~LOC~~ SOAJ
7.5000 mg | SUBCUTANEOUS | 0 refills | Status: DC
  Filled 2022-03-25: qty 2, 28d supply, fill #0

## 2022-04-19 ENCOUNTER — Other Ambulatory Visit (INDEPENDENT_AMBULATORY_CARE_PROVIDER_SITE_OTHER): Payer: Self-pay | Admitting: Family Medicine

## 2022-04-19 ENCOUNTER — Other Ambulatory Visit (HOSPITAL_COMMUNITY): Payer: Self-pay

## 2022-04-19 ENCOUNTER — Other Ambulatory Visit: Payer: Self-pay | Admitting: Behavioral Health

## 2022-04-19 DIAGNOSIS — F411 Generalized anxiety disorder: Secondary | ICD-10-CM

## 2022-04-19 DIAGNOSIS — R7303 Prediabetes: Secondary | ICD-10-CM

## 2022-04-19 DIAGNOSIS — F99 Mental disorder, not otherwise specified: Secondary | ICD-10-CM

## 2022-04-20 ENCOUNTER — Other Ambulatory Visit (HOSPITAL_COMMUNITY): Payer: Self-pay

## 2022-04-20 NOTE — Telephone Encounter (Signed)
Please schedule appt

## 2022-04-21 ENCOUNTER — Encounter (INDEPENDENT_AMBULATORY_CARE_PROVIDER_SITE_OTHER): Payer: Self-pay | Admitting: Family Medicine

## 2022-04-21 NOTE — Progress Notes (Unsigned)
TeleHealth Visit:  This visit was completed with telemedicine (audio/video) technology. Alejandro [name] has verbally consented to this TeleHealth visit. The patient is located at home, the provider is located at home. The participants in this visit include the listed provider and patient. The visit was conducted today via MyChart video.  OBESITY Alejandro [name] is here to discuss his progress with his obesity treatment plan along with follow-up of his obesity related diagnoses.    Today's visit was # 44  Starting weight: 254 lbs Starting date: 04/09/2019 Weight reported at last virtual office visit: 218 lbs on 03/25/22 Weight at clinic on 04/21/22: 218 lbs Total weight loss: 36 Weight change since last visit: 0  Nutrition Plan: the Category 4 Plan.   Current exercise: none due to back pain  Interim History: Alejandro [name] has maintained his weight.  He reports he has not done very well with the category 4 plan since his last office visit.  He often skips breakfast.  Alejandro [name] is a challenge due to his kids having sports activities from 5 to 8 PM and they tend to eat out.  His kids like [employer].  He is not grocery shopping as often as he should.  He grocery shops and cooks for his family. Has been unable to exercise due to severe pain from ruptured disks. He does not feel the upcoming holidays will be an issue.  Reports mood is "good".  He is seeing psychiatry regularly.  No longer seeing a counselor. Assessment/Plan:  We discussed recent lab results in depth.  1. Prediabetes A1c is up a bit.  He has not been on a GLP-1 for the entire 90-month  Prior to this A1c. Medication(s): Mounjaro 7.5 mg weekly.  Tolerating well.  Does not feel its providing a lot of appetite suppression. He is also taking metformin. Lab Results  Component Value Date   HGBA1C 5.7 (H) 04/21/2022   Lab Results  Component Value Date   INSULIN 14.6 04/21/2022   INSULIN 25.5 (H) 02/03/2021   INSULIN 14.2 06/04/2020   INSULIN  7.2 12/10/2019   INSULIN 32.3 (H) 07/31/2019    Plan: Discontinue metformin. Refill and increase dose of Mounjaro to 10 mg weekly.   2. Vitamin D Deficiency Vitamin D is not at goal of 50.  Vitamin D is 35.  He has not been taking the vitamin D as prescribed because he forgets. He is on weekly prescription Vitamin D 50,000 IU.  Lab Results  Component Value Date   VD25OH 35.3 04/21/2022   VD25OH 33.5 02/03/2021   VD25OH 24.8 (L) 06/04/2020    Plan: Continue prescription vitamin D 50,000 IU weekly. Suggested he use a pillbox for his medications for better compliance.   3. Obesity: Current BMI 33.2 MAdairis currently in the action stage of change. As such, his goal is to continue with weight loss efforts.  He has agreed to the Category 4 Plan.  He will make a list and grocery shop more regularly. Suggested he plan to cook 2 meals per week with leftovers. Suggested choosing restaurants with healthy choices rather than McDonald[employer].  Exercise goals: No exercise has been prescribed at this time.  Behavioral modification strategies: increasing lean protein intake, decreasing simple carbohydrates, decreasing eating out, no skipping meals, and meal planning and cooking strategies.  MJessicahas agreed to follow-up with our clinic in 4 weeks.   No orders of the defined types were placed in this encounter.   Medications Discontinued During This Encounter  Medication Reason   tirzepatide (  MOUNJARO) 7.5 MG/0.5ML Pen    metFORMIN (GLUCOPHAGE) 500 MG tablet Discontinued by provider     Meds ordered this encounter  Medications   tirzepatide (MOUNJARO) 10 MG/0.5ML Pen    Sig: Inject 10 mg into the skin once a week.    Dispense:  2 mL    Refill:  0    Order Specific Question:   Supervising Provider    Answer:   Dell Ponto [[id number]]      Objective:   VITALS: Per patient if applicable, see vitals. GENERAL: Alert and in no acute distress. CARDIOPULMONARY: No increased WOB.  Speaking in clear sentences.  PSYCH: Pleasant and cooperative. Speech normal rate and rhythm. Affect is appropriate. Insight and judgement are appropriate. Attention is focused, linear, and appropriate.  NEURO: Oriented as arrived to appointment on time with no prompting.   Lab Results  Component Value Date   CREATININE 0.93 04/21/2022   BUN 12 04/21/2022   NA 140 04/21/2022   K 4.1 04/21/2022   CL 102 04/21/2022   CO2 22 04/21/2022   Lab Results  Component Value Date   ALT 34 04/21/2022   AST 20 04/21/2022   ALKPHOS 65 04/21/2022   BILITOT 0.5 04/21/2022   Lab Results  Component Value Date   HGBA1C 5.7 (H) 04/21/2022   HGBA1C 5.2 07/07/2021   HGBA1C 5.8 (H) 02/03/2021   HGBA1C 5.3 06/04/2020   HGBA1C 5.2 12/10/2019   Lab Results  Component Value Date   INSULIN 14.6 04/21/2022   INSULIN 25.5 (H) 02/03/2021   INSULIN 14.2 06/04/2020   INSULIN 7.2 12/10/2019   INSULIN 32.3 (H) 07/31/2019   Lab Results  Component Value Date   TSH 1.830 04/09/2019   Lab Results  Component Value Date   CHOL 159 10/15/2021   HDL 63 10/15/2021   LDLCALC 82 10/15/2021   LDLDIRECT 224.0 08/26/2017   TRIG 69 10/15/2021   CHOLHDL 2.5 10/15/2021   Lab Results  Component Value Date   WBC 6.4 07/07/2021   HGB 17.0 07/07/2021   HCT 49.6 07/07/2021   MCV 86.6 07/07/2021   PLT 494 (H) 07/07/2021   No results found for: "IRON", "TIBC", "FERRITIN" Lab Results  Component Value Date   VD25OH 35.3 04/21/2022   VD25OH 33.5 02/03/2021   VD25OH 24.8 (L) 06/04/2020    Attestation Statements:   Reviewed by clinician on day of visit: allergies, medications, problem list, medical history, surgical history, family history, social history, and previous encounter notes.

## 2022-04-22 ENCOUNTER — Encounter (INDEPENDENT_AMBULATORY_CARE_PROVIDER_SITE_OTHER): Payer: Self-pay | Admitting: Family Medicine

## 2022-04-22 ENCOUNTER — Telehealth (INDEPENDENT_AMBULATORY_CARE_PROVIDER_SITE_OTHER): Payer: Commercial Managed Care - PPO | Admitting: Family Medicine

## 2022-04-22 ENCOUNTER — Other Ambulatory Visit (HOSPITAL_COMMUNITY): Payer: Self-pay

## 2022-04-22 DIAGNOSIS — E559 Vitamin D deficiency, unspecified: Secondary | ICD-10-CM

## 2022-04-22 DIAGNOSIS — R7303 Prediabetes: Secondary | ICD-10-CM

## 2022-04-22 DIAGNOSIS — Z6833 Body mass index (BMI) 33.0-33.9, adult: Secondary | ICD-10-CM | POA: Diagnosis not present

## 2022-04-22 DIAGNOSIS — E669 Obesity, unspecified: Secondary | ICD-10-CM | POA: Diagnosis not present

## 2022-04-22 DIAGNOSIS — E6609 Other obesity due to excess calories: Secondary | ICD-10-CM

## 2022-04-22 LAB — COMPREHENSIVE METABOLIC PANEL
ALT: 34 IU/L (ref 0–44)
AST: 20 IU/L (ref 0–40)
Albumin/Globulin Ratio: 2.3 — ABNORMAL HIGH (ref 1.2–2.2)
Albumin: 5.1 g/dL (ref 4.1–5.1)
Alkaline Phosphatase: 65 IU/L (ref 44–121)
BUN/Creatinine Ratio: 13 (ref 9–20)
BUN: 12 mg/dL (ref 6–24)
Bilirubin Total: 0.5 mg/dL (ref 0.0–1.2)
CO2: 22 mmol/L (ref 20–29)
Calcium: 9.6 mg/dL (ref 8.7–10.2)
Chloride: 102 mmol/L (ref 96–106)
Creatinine, Ser: 0.93 mg/dL (ref 0.76–1.27)
Globulin, Total: 2.2 g/dL (ref 1.5–4.5)
Glucose: 78 mg/dL (ref 70–99)
Potassium: 4.1 mmol/L (ref 3.5–5.2)
Sodium: 140 mmol/L (ref 134–144)
Total Protein: 7.3 g/dL (ref 6.0–8.5)
eGFR: 105 mL/min/{1.73_m2} (ref >59)

## 2022-04-22 LAB — HEMOGLOBIN A1C
Est. average glucose Bld gHb Est-mCnc: 117 mg/dL
Hgb A1c MFr Bld: 5.7 % — ABNORMAL HIGH (ref 4.8–5.6)

## 2022-04-22 LAB — INSULIN, RANDOM: INSULIN: 14.6 u[IU]/mL (ref 2.6–24.9)

## 2022-04-22 LAB — VITAMIN D 25 HYDROXY (VIT D DEFICIENCY, FRACTURES): Vit D, 25-Hydroxy: 35.3 ng/mL (ref 30.0–100.0)

## 2022-04-22 MED ORDER — TIRZEPATIDE 10 MG/0.5ML ~~LOC~~ SOAJ
10.0000 mg | SUBCUTANEOUS | 0 refills | Status: DC
  Filled 2022-04-22: qty 2, 28d supply, fill #0

## 2022-04-22 MED ORDER — PREDNISONE 10 MG (21) PO TBPK
ORAL_TABLET | ORAL | 0 refills | Status: AC
  Filled 2022-04-22: qty 21, 6d supply, fill #0

## 2022-04-23 ENCOUNTER — Other Ambulatory Visit (HOSPITAL_COMMUNITY): Payer: Self-pay

## 2022-05-14 NOTE — Telephone Encounter (Signed)
LVM to schedule f/u

## 2022-05-19 NOTE — Progress Notes (Signed)
TeleHealth Visit:  This visit was completed with telemedicine (audio/video) technology. Genie has verbally consented to this TeleHealth visit. The patient is located at home, the provider is located at home. The participants in this visit include the listed provider and patient. The visit was conducted today via MyChart video.  OBESITY Alejandro [name] is here to discuss his progress with his obesity treatment plan along with follow-up of his obesity related diagnoses.    Today's visit was # 57  Starting weight: 254 lbs Starting date: 04/09/2019 Weight at clinic on 04/21/22: 218 lbs Total weight loss: 36 Weight change since last visit: 218 lbs   Nutrition Plan: the Category 4 Plan - 50-60% on plan.  Current exercise: yoga/Functional 45 (HIIT) 45-60 min 3 days week.   Interim History: Has begun exercising regularly since last office visit.  Doing better with not skipping breakfast.  Also packing lunch more often.  Has been difficult at work due to foods being brought in such as doughnuts. Going to the beach with his parents for Christmas-anticipates a lot of sweets being brought in.  His mother is now a patient in our clinic also.  Assessment/Plan:  We discussed recent lab results in depth.  1. Hyperlipidemia LDL is not at goal.  Last LDL was 82-goal is 70.  Had MI last January due to Takotsubo syndrome.  Does not have follow-up scheduled with cardiology. Medication(s): Crestor 40 mg daily   Lab Results  Component Value Date   CHOL 159 10/15/2021   HDL 63 10/15/2021   LDLCALC 82 10/15/2021   LDLDIRECT 224.0 08/26/2017   TRIG 69 10/15/2021   CHOLHDL 2.5 10/15/2021   Lab Results  Component Value Date   ALT 34 04/21/2022   AST 20 04/21/2022   ALKPHOS 65 04/21/2022   BILITOT 0.5 04/21/2022   The 10-year ASCVD risk score (Arnett DK, et al., 2019) is: 0.6%   Values used to calculate the score:     Age: 41 years     Sex: Male     Is Non-Hispanic African American: No      Diabetic: No     Tobacco smoker: No     Systolic Blood Pressure: 147 mmHg     Is BP treated: No     HDL Cholesterol: 63 mg/dL     Total Cholesterol: 159 mg/dL  Plan: Continue Crestor 40 mg daily Make fu appointment with cardiology.   2. Vitamin D Deficiency Vitamin D is not at goal of 50. Has not been taking Rx vit D regularly.   Lab Results  Component Value Date   VD25OH 35.3 04/21/2022   VD25OH 33.5 02/03/2021   VD25OH 24.8 (L) 06/04/2020    Plan: Refill prescription vitamin D 50,000 IU weekly. Will improve compliance.   3. Prediabetes Last A1c elevated at 5.7 on 04/21/2022.  Has some polyphagia.  Would like to increase dose of Mounjaro.  Currently on Mounjaro 10 mg weekly. He is still getting the [facility] for 0 co-pay.  Lab Results  Component Value Date   HGBA1C 5.7 (H) 04/21/2022   Lab Results  Component Value Date   INSULIN 14.6 04/21/2022   INSULIN 25.5 (H) 02/03/2021   INSULIN 14.2 06/04/2020   INSULIN 7.2 12/10/2019   INSULIN 32.3 (H) 07/31/2019    Plan: Refill and increase dose Mounjaro 12.5 mg weekly.  4. Obesity: Current BMI 33.2 Alejandro [name] is currently in the action stage of change. As such, his goal is to continue with weight loss efforts.  He has  agreed to the Category 4 Plan.   Exercise goals: as is  Behavioral modification strategies: increasing lean protein intake, decreasing simple carbohydrates, decreasing eating out, no skipping meals, meal planning and cooking strategies, and travel eating strategies.  Alejandro [name] has agreed to follow-up with our clinic in 4 weeks.   No orders of the defined types were placed in this encounter.   Medications Discontinued During This Encounter  Medication Reason   tirzepatide (MOUNJARO) 10 MG/0.5ML Pen Dose change   Vitamin D, Ergocalciferol, (DRISDOL) 1.25 MG (50000 UNIT) CAPS capsule Reorder   rosuvastatin (CRESTOR) 40 MG tablet Reorder     Meds ordered this encounter  Medications   tirzepatide  (MOUNJARO) 12.5 MG/0.5ML Pen    Sig: Inject 12.5 mg into the skin once a week.    Dispense:  2 mL    Refill:  0    Order Specific Question:   Supervising Provider    Answer:   Dell Ponto [[id number]]   rosuvastatin (CRESTOR) 40 MG tablet    Sig: Take 1 tablet (40 mg total) by mouth daily.    Dispense:  90 tablet    Refill:  0    Order Specific Question:   Supervising Provider    Answer:   Loyal Gambler E [[id number]]   Vitamin D, Ergocalciferol, (DRISDOL) 1.25 MG (50000 UNIT) CAPS capsule    Sig: Take 1 capsule (50,000 Units total) by mouth every 7 (seven) days.    Dispense:  12 capsule    Refill:  0    Order Specific Question:   Supervising Provider    Answer:   Dell Ponto [[id number]]      Objective:   VITALS: Per patient if applicable, see vitals. GENERAL: Alert and in no acute distress. CARDIOPULMONARY: No increased WOB. Speaking in clear sentences.  PSYCH: Pleasant and cooperative. Speech normal rate and rhythm. Affect is appropriate. Insight and judgement are appropriate. Attention is focused, linear, and appropriate.  NEURO: Oriented as arrived to appointment on time with no prompting.   Lab Results  Component Value Date   CREATININE 0.93 04/21/2022   BUN 12 04/21/2022   NA 140 04/21/2022   K 4.1 04/21/2022   CL 102 04/21/2022   CO2 22 04/21/2022   Lab Results  Component Value Date   ALT 34 04/21/2022   AST 20 04/21/2022   ALKPHOS 65 04/21/2022   BILITOT 0.5 04/21/2022   Lab Results  Component Value Date   HGBA1C 5.7 (H) 04/21/2022   HGBA1C 5.2 07/07/2021   HGBA1C 5.8 (H) 02/03/2021   HGBA1C 5.3 06/04/2020   HGBA1C 5.2 12/10/2019   Lab Results  Component Value Date   INSULIN 14.6 04/21/2022   INSULIN 25.5 (H) 02/03/2021   INSULIN 14.2 06/04/2020   INSULIN 7.2 12/10/2019   INSULIN 32.3 (H) 07/31/2019   Lab Results  Component Value Date   TSH 1.830 04/09/2019   Lab Results  Component Value Date   CHOL 159 10/15/2021   HDL 63 10/15/2021   LDLCALC 82  10/15/2021   LDLDIRECT 224.0 08/26/2017   TRIG 69 10/15/2021   CHOLHDL 2.5 10/15/2021   Lab Results  Component Value Date   WBC 6.4 07/07/2021   HGB 17.0 07/07/2021   HCT 49.6 07/07/2021   MCV 86.6 07/07/2021   PLT 494 (H) 07/07/2021   No results found for: "IRON", "TIBC", "FERRITIN" Lab Results  Component Value Date   VD25OH 35.3 04/21/2022   VD25OH 33.5 02/03/2021   VD25OH 24.8 (L) 06/04/2020  Attestation Statements:   Reviewed by clinician on day of visit: allergies, medications, problem list, medical history, surgical history, family history, social history, and previous encounter notes.

## 2022-05-20 ENCOUNTER — Other Ambulatory Visit (HOSPITAL_COMMUNITY): Payer: Self-pay

## 2022-05-24 ENCOUNTER — Telehealth (INDEPENDENT_AMBULATORY_CARE_PROVIDER_SITE_OTHER): Payer: Commercial Managed Care - PPO | Admitting: Family Medicine

## 2022-05-24 ENCOUNTER — Encounter (INDEPENDENT_AMBULATORY_CARE_PROVIDER_SITE_OTHER): Payer: Self-pay | Admitting: Family Medicine

## 2022-05-24 ENCOUNTER — Other Ambulatory Visit (HOSPITAL_COMMUNITY): Payer: Self-pay

## 2022-05-24 VITALS — Ht 68.0 in | Wt 218.0 lb

## 2022-05-24 DIAGNOSIS — E6609 Other obesity due to excess calories: Secondary | ICD-10-CM

## 2022-05-24 DIAGNOSIS — E782 Mixed hyperlipidemia: Secondary | ICD-10-CM

## 2022-05-24 DIAGNOSIS — R7303 Prediabetes: Secondary | ICD-10-CM | POA: Diagnosis not present

## 2022-05-24 DIAGNOSIS — E669 Obesity, unspecified: Secondary | ICD-10-CM

## 2022-05-24 DIAGNOSIS — E559 Vitamin D deficiency, unspecified: Secondary | ICD-10-CM | POA: Diagnosis not present

## 2022-05-24 DIAGNOSIS — Z6833 Body mass index (BMI) 33.0-33.9, adult: Secondary | ICD-10-CM

## 2022-05-24 MED ORDER — VITAMIN D (ERGOCALCIFEROL) 1.25 MG (50000 UNIT) PO CAPS
50000.0000 [IU] | ORAL_CAPSULE | ORAL | 0 refills | Status: DC
  Filled 2022-05-24: qty 12, 84d supply, fill #0

## 2022-05-24 MED ORDER — TIRZEPATIDE 12.5 MG/0.5ML ~~LOC~~ SOAJ
12.5000 mg | SUBCUTANEOUS | 0 refills | Status: DC
  Filled 2022-05-24: qty 2, 28d supply, fill #0

## 2022-05-24 MED ORDER — ROSUVASTATIN CALCIUM 40 MG PO TABS
40.0000 mg | ORAL_TABLET | Freq: Every day | ORAL | 0 refills | Status: DC
  Filled 2022-05-24: qty 30, 30d supply, fill #0

## 2022-05-25 ENCOUNTER — Other Ambulatory Visit (HOSPITAL_COMMUNITY): Payer: Self-pay

## 2022-06-12 IMAGING — CT CT ANGIO CHEST
3 of 7 series · 18 of 46 positions shown · IV contrast (agent unspecified)
Comparison: Echocardiogram report, 07/08/2021. L-spine XRs,
12/04/2020. Chest and rib XRs, 09/20/2017.

CLINICAL DATA: Abnormal fluid around the aortic arch

EXAM:
CT ANGIOGRAPHY CHEST WITH CONTRAST
TECHNIQUE: Multidetector CT imaging of the chest was performed using the
standard protocol during bolus administration of intravenous
contrast. Multiplanar CT image reconstructions and MIPs were
obtained to evaluate the vascular anatomy.

[Series 6: dissection 2mm · axial · 0.77mm/px · z∈[-480,-192]mm · 13 of 168 slices shown]
[im 12/168  lung]
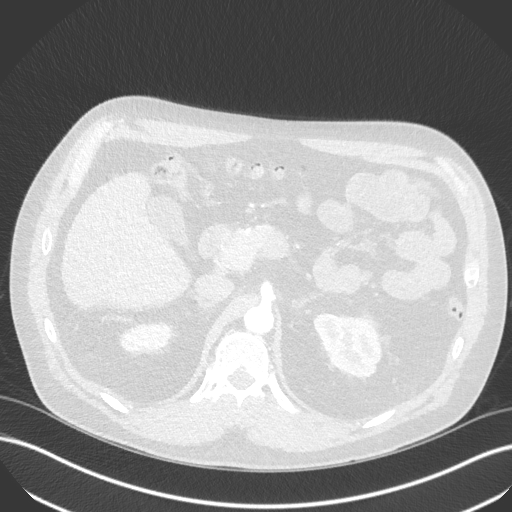
[im 24/168  soft-tissue]
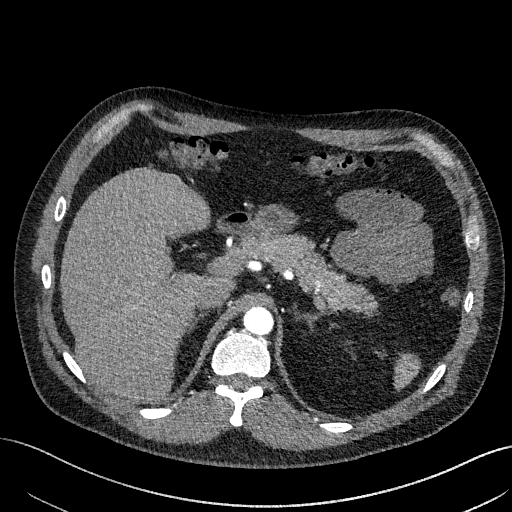
[im 36/168  lung]
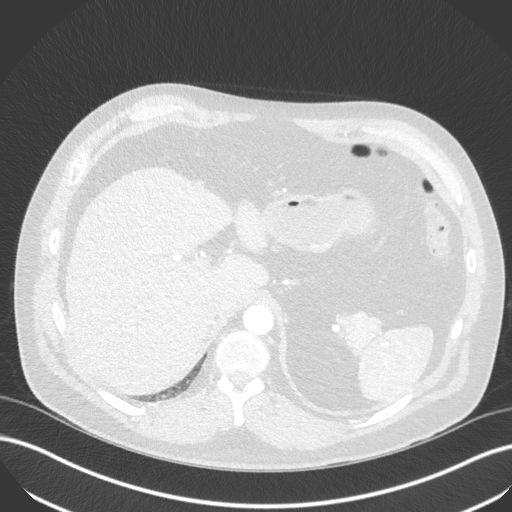
[im 48/168  soft-tissue]
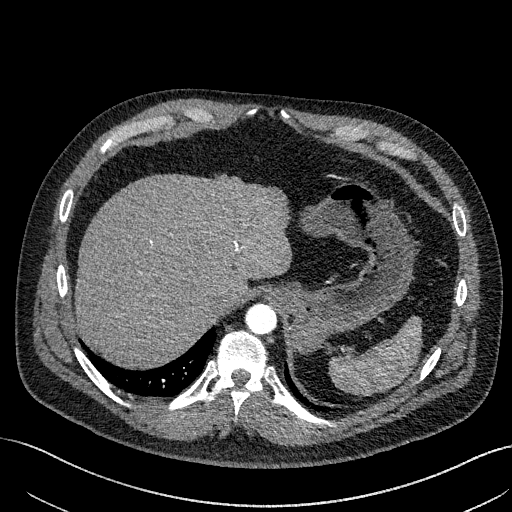
[im 60/168  lung]
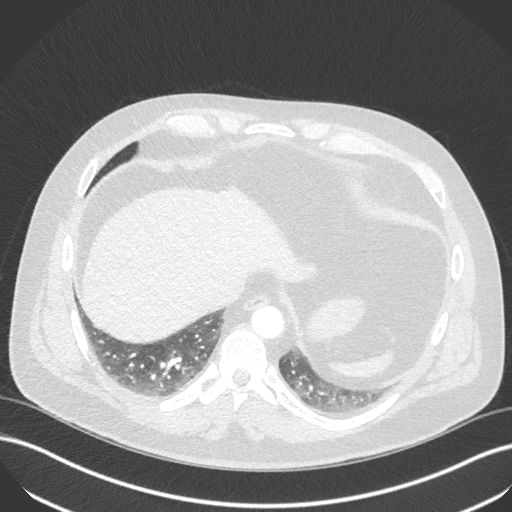
[im 72/168  soft-tissue]
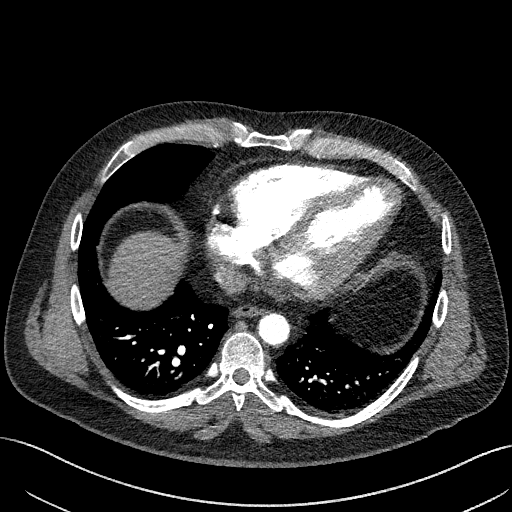
[im 84/168  lung]
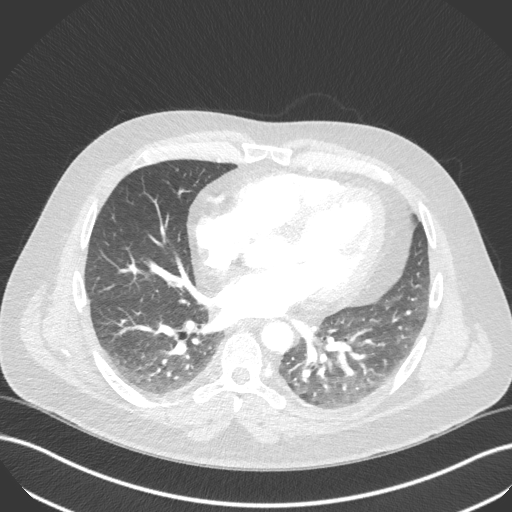
[im 96/168  soft-tissue]
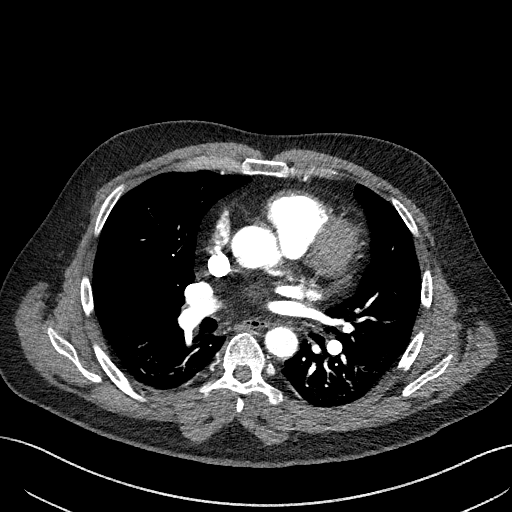
[im 108/168  lung]
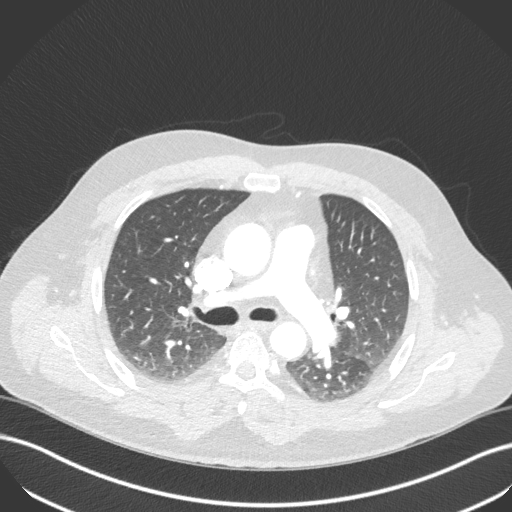
[im 120/168  soft-tissue]
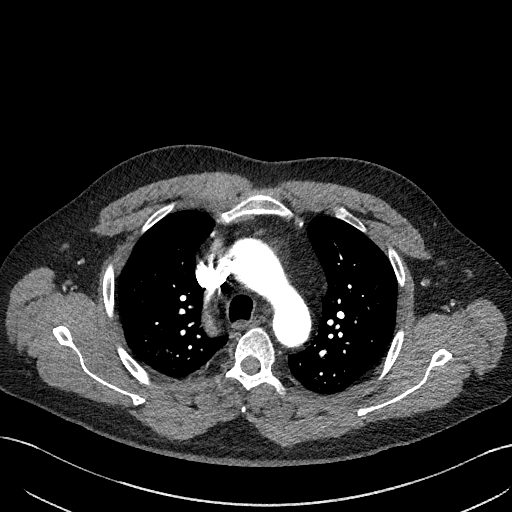
[im 132/168  lung]
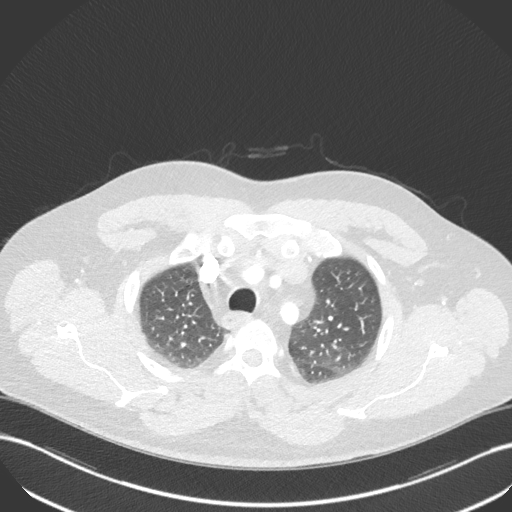
[im 144/168  soft-tissue]
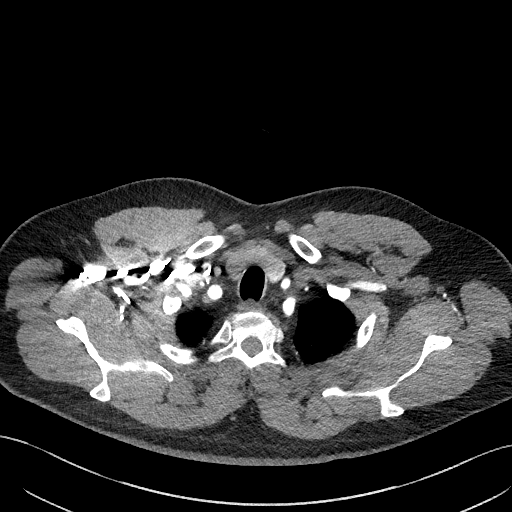
[im 156/168  lung]
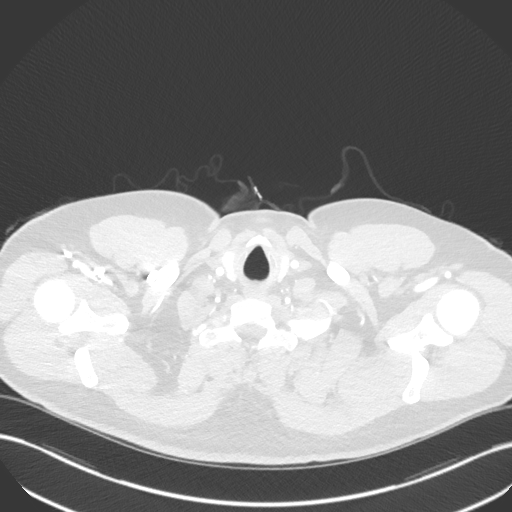

[Series 7: lung · axial · 0.77mm/px · z∈[-414,-390]mm · 2 of 136 slices shown]
[im 13/136  soft-tissue]
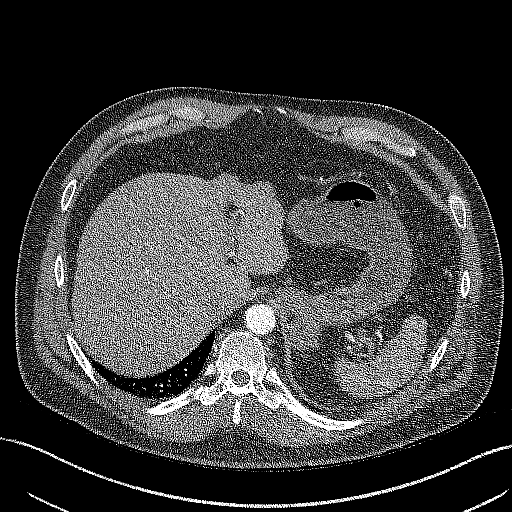
[im 25/136  soft-tissue]
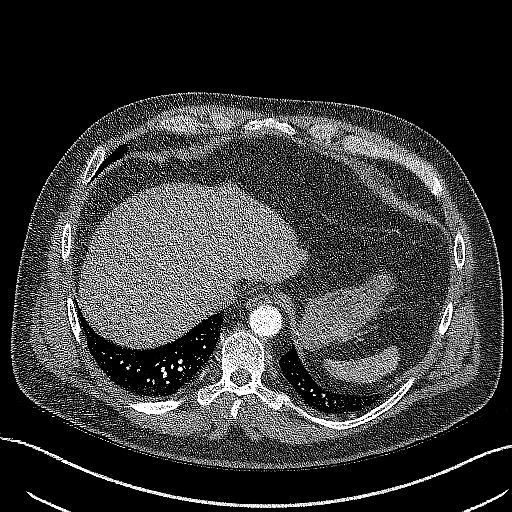

[Series 9: dissection 2mm cor · coronal · 0.64mm/px · 3 of 130 slices shown]
[im 33/130  soft-tissue]
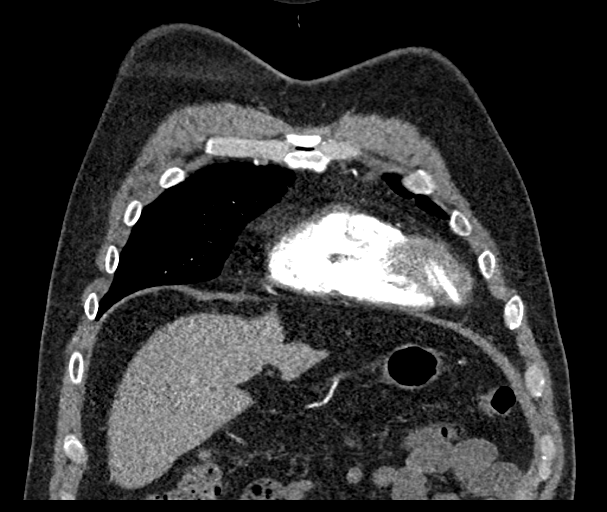
[im 65/130  soft-tissue]
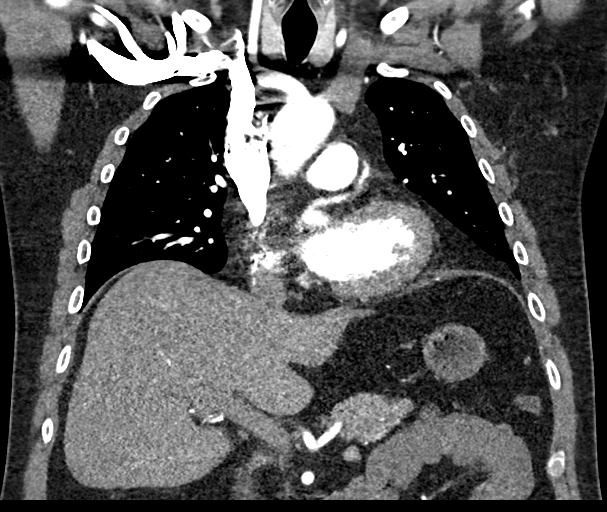
[im 97/130  soft-tissue]
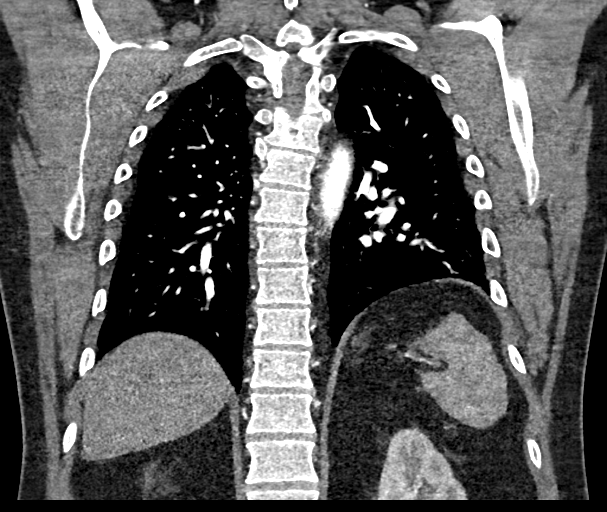

[18 of 46 positions shown; findings below may reference images not displayed]

RADIATION DOSE REDUCTION: This exam was performed according to the
departmental dose-optimization program which includes automated
exposure control, adjustment of the mA and/or kV according to
patient size and/or use of iterative reconstruction technique.

CONTRAST:  100mL OMNIPAQUE IOHEXOL 350 MG/ML SOLN
FINDINGS: CARDIOVASCULAR:

Limitations by motion: Mild

Preferential opacification of the thoracic aorta. No evidence of
thoracic aortic aneurysm or dissection.

Conventional 3 vessel LEFT aortic arch.

Aortic Root:

--Valve: 3.0 cm

--Sinuses: 3.6 cm

--Sinotubular Junction: 3.0 cm

Thoracic Aorta:

--Ascending Aorta: 3.9 cm

--Aortic Arch: 3.1 cm

--Descending Aorta: 2.8 cm

Other:

Normal heart size. Miniscule pericardial effusion. No segmental or
larger pulmonary embolus.

Mediastinum/Nodes: No enlarged mediastinal, hilar, or axillary lymph
nodes. Thyroid gland, trachea, and esophagus demonstrate no
significant findings.

Lungs/Pleura: Trace bibasilar atelectasis. Lungs are otherwise
clear. No suspicious pulmonary nodule or mass. No pleural effusion
or pneumothorax.

Upper Abdomen: Hepatic steatosis.  No acute abnormality.

Musculoskeletal: No chest wall abnormality. No acute osseous
findings.

Review of the MIP images confirms the above findings.
IMPRESSION: 1. No acute intrathoracic or vascular abnormality.
2. Nonaneurysmal thoracic aorta.
3. Incidental hepatic steatosis.

## 2022-06-15 ENCOUNTER — Ambulatory Visit (INDEPENDENT_AMBULATORY_CARE_PROVIDER_SITE_OTHER): Payer: Commercial Managed Care - PPO | Admitting: Nurse Practitioner

## 2022-06-15 ENCOUNTER — Encounter: Payer: Self-pay | Admitting: Nurse Practitioner

## 2022-06-15 ENCOUNTER — Other Ambulatory Visit (HOSPITAL_COMMUNITY): Payer: Self-pay

## 2022-06-15 VITALS — BP 120/72 | HR 85 | Ht 68.0 in | Wt 219.0 lb

## 2022-06-15 DIAGNOSIS — R051 Acute cough: Secondary | ICD-10-CM

## 2022-06-15 DIAGNOSIS — R52 Pain, unspecified: Secondary | ICD-10-CM

## 2022-06-15 DIAGNOSIS — R509 Fever, unspecified: Secondary | ICD-10-CM

## 2022-06-15 DIAGNOSIS — G4489 Other headache syndrome: Secondary | ICD-10-CM | POA: Insufficient documentation

## 2022-06-15 DIAGNOSIS — J101 Influenza due to other identified influenza virus with other respiratory manifestations: Secondary | ICD-10-CM

## 2022-06-15 LAB — POC COVID19 BINAXNOW: SARS Coronavirus 2 Ag: NEGATIVE

## 2022-06-15 LAB — POC INFLUENZA A&B (BINAX/QUICKVUE)
Influenza A, POC: POSITIVE — AB
Influenza B, POC: NEGATIVE

## 2022-06-15 MED ORDER — OSELTAMIVIR PHOSPHATE 75 MG PO CAPS
75.0000 mg | ORAL_CAPSULE | Freq: Two times a day (BID) | ORAL | 0 refills | Status: DC
  Filled 2022-06-15: qty 10, 5d supply, fill #0

## 2022-06-15 MED ORDER — BENZONATATE 100 MG PO CAPS
100.0000 mg | ORAL_CAPSULE | Freq: Three times a day (TID) | ORAL | 0 refills | Status: DC | PRN
  Filled 2022-06-15: qty 21, 7d supply, fill #0

## 2022-06-15 NOTE — Assessment & Plan Note (Signed)
Rest, drink plenty of fluid.  Over-the-counter analgesics as needed.  Flu and COVID test in office

## 2022-06-15 NOTE — Assessment & Plan Note (Signed)
Tested positive for influenza A.  Tamiflu 75 mg twice daily for 5 days.  Reviewed side effects of medication

## 2022-06-15 NOTE — Assessment & Plan Note (Signed)
COVID test negative in office.  Flu test positive respiratory plenty of fluid over-the-counter analgesics as needed

## 2022-06-15 NOTE — Assessment & Plan Note (Signed)
Flu and COVID test in office.  Tessalon Perles 200 mg 3 times daily as needed cough

## 2022-06-15 NOTE — Assessment & Plan Note (Signed)
Rest drink plenty of fluid over-the-counter analgesics as needed flu and COVID test in office.  COVID test negative flu test positive

## 2022-06-15 NOTE — Patient Instructions (Signed)
Nice to see you today I have sent in the tamiflu and the cough medication Follow up if you do not improve

## 2022-06-15 NOTE — Progress Notes (Signed)
Acute Office Visit  Subjective:     Patient ID: Alejandro [name], male    DOB: May 19, 1981, 42 y.o.   MRN: [id number]  Chief Complaint  Patient presents with   Cough    Congestion, body aches, fever (101), fatigue     Patient is in today for Sick sympotms  Symptoms started on 30-31 States that his Cristal Generous sone has been sick and had a fever States that 2-3 days later he got it then his older son got it Flu vaccine up to date Covid vaccine: Moderna x2 and one booster Has been using tylenol cold, had a couple amoxicillin that he took 2 of them. States that the otc has help some    Review of Systems  Constitutional:  Positive for chills, fever and malaise/fatigue.  HENT:  Positive for sinus pain. Negative for ear discharge, ear pain and sore throat.   Respiratory:  Positive for cough, sputum production and shortness of breath (with exertion).   Gastrointestinal:  Negative for nausea and vomiting.  Musculoskeletal:  Positive for myalgias.  Neurological:  Positive for headaches.        Objective:    BP 120/72   Pulse 85   Ht '5\' 8"'$  (1.727 m)   Wt 219 lb (99.3 kg)   SpO2 95%   BMI 33.30 kg/m    Physical Exam Vitals and nursing note reviewed.  Constitutional:      Appearance: Normal appearance.  HENT:     Right Ear: Ear canal and external ear normal.     Left Ear: Tympanic membrane, ear canal and external ear normal.     Ears:     Comments: Fluid behind right TM    Mouth/Throat:     Mouth: Mucous membranes are moist.     Pharynx: Oropharynx is clear.  Cardiovascular:     Rate and Rhythm: Normal rate and regular rhythm.     Heart sounds: Normal heart sounds.  Pulmonary:     Effort: Pulmonary effort is normal.     Breath sounds: Normal breath sounds.  Abdominal:     General: Bowel sounds are normal. There is no distension.     Palpations: There is no mass.     Tenderness: There is no abdominal tenderness.     Hernia: No hernia is present.  Lymphadenopathy:      Cervical: No cervical adenopathy.  Neurological:     Mental Status: He is alert.     Results for orders placed or performed in visit on 06/15/22  POC Influenza A&B(BINAX/QUICKVUE)  Result Value Ref Range   Influenza A, POC Positive (A) Negative   Influenza B, POC Negative Negative  POC COVID-19  Result Value Ref Range   SARS Coronavirus 2 Ag Negative Negative        Assessment & Plan:   Problem List Items Addressed This Visit       Respiratory   Influenza A    Tested positive for influenza A.  Tamiflu 75 mg twice daily for 5 days.  Reviewed side effects of medication      Relevant Medications   oseltamivir (TAMIFLU) 75 MG capsule     Other   Other headache syndrome    COVID test negative in office.  Flu test positive respiratory plenty of fluid over-the-counter analgesics as needed      Body aches    Rest drink plenty of fluid over-the-counter analgesics as needed flu and COVID test in office.  COVID test negative flu  test positive      Fever and chills    Rest, drink plenty of fluid.  Over-the-counter analgesics as needed.  Flu and COVID test in office      Acute cough - Primary    Flu and COVID test in office.  Tessalon Perles 200 mg 3 times daily as needed cough      Relevant Medications   benzonatate (TESSALON) 100 MG capsule   Other Relevant Orders   POC Influenza A&B(BINAX/QUICKVUE) (Completed)   POC COVID-19 (Completed)    Meds ordered this encounter  Medications   oseltamivir (TAMIFLU) 75 MG capsule    Sig: Take 1 capsule (75 mg total) by mouth 2 (two) times daily.    Dispense:  10 capsule    Refill:  0    Order Specific Question:   Supervising Provider    Answer:   Loura Pardon A [[id number]]   benzonatate (TESSALON) 100 MG capsule    Sig: Take 1 capsule (100 mg total) by mouth 3 (three) times daily as needed for cough.    Dispense:  21 capsule    Refill:  0    Order Specific Question:   Supervising Provider    Answer:   TOWER, MARNE A  [[id number]]    Return if symptoms worsen or fail to improve.  Romilda Garret, NP

## 2022-06-17 NOTE — Progress Notes (Signed)
TeleHealth Visit:  This visit was completed with telemedicine (audio/video) technology. Alejandro [name] has verbally consented to this TeleHealth visit. The patient is located at home, the provider is located at home. The participants in this visit include the listed provider and patient. The visit was conducted today via MyChart video.  OBESITY Alejandro [name] is here to discuss his progress with his obesity treatment plan along with follow-up of his obesity related diagnoses.    Today's visit was # 48  Starting weight: 254 lbs Starting date: 04/09/2019 Weight at clinic on 04/21/22: 218 lbs Today's weight (in office): 217 lbs  Total weight loss: 37 Weight change since last visit: -1   Nutrition Plan: the Category 4 Plan   Current exercise:  none  Interim History: Alejandro [name] has been ill with the flu.  He has not exercised over the past few weeks.  Reports he had been on plan but then this past weekend things did not go well due to being very busy with his sons' sports activities. He did manage to lose 1 pound since his last office visit. He says that meal planning is the key to being successful for him.  His kids have sports activities most days of the week which leads to a lot of dining out.  His goal is to lose 25 pounds by October-198 pounds.  He will be having his 25th high school reunion. Assessment/Plan:  1. Prediabetes Last A1c elevated at 5.7.  Medication(s): Mounjaro 12.5 mg weekly.  Dose increased from 10 mg last office visit.  Appetite well-controlled. Lab Results  Component Value Date   HGBA1C 5.7 (H) 04/21/2022   Lab Results  Component Value Date   INSULIN 14.6 04/21/2022   INSULIN 25.5 (H) 02/03/2021   INSULIN 14.2 06/04/2020   INSULIN 7.2 12/10/2019   INSULIN 32.3 (H) 07/31/2019    Plan: Refill Mounjaro 12.5 mg weekly.   2. Hyperlipidemia LDL is at goal.  LDL was 82 on 10/15/2021.  HDL and triglycerides were normal.  10-year ASCVD risk score 0.7%. Had MI last year.   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   No significant CAD. Medication(s): Crestor 40 mg daily.   Lab Results  Component Value Date   CHOL 159 10/15/2021   HDL 63 10/15/2021   LDLCALC 82 10/15/2021   LDLDIRECT 224.0 08/26/2017   TRIG 69 10/15/2021   CHOLHDL 2.5 10/15/2021   Lab Results  Component Value Date   ALT 34 04/21/2022   AST 20 04/21/2022   ALKPHOS 65 04/21/2022   BILITOT 0.5 04/21/2022   The 10-year ASCVD risk score (Arnett DK, et al., 2019) is: 0.7%   Values used to calculate the score:     Age: 42 years     Sex: Male     Is Non-Hispanic African American: No     Diabetic: No     Tobacco smoker: No     Systolic Blood Pressure: 829 mmHg     Is BP treated: No     HDL Cholesterol: 63 mg/dL     Total Cholesterol: 159 mg/dL  Plan: Continue statin.   3. Obesity: Current BMI 54 Alejandro [name] is currently in the action stage of change. As such, his goal is to continue with weight loss efforts.  He has agreed to the Category 4 Plan.   Exercise goals: Plans to start back to exercise today.  Behavioral modification strategies: increasing lean protein intake, decreasing simple carbohydrates, meal planning and cooking strategies, and planning for success.  Alejandro [name] has agreed  to follow-up with our clinic in 4 weeks.   No orders of the defined types were placed in this encounter.   Medications Discontinued During This Encounter  Medication Reason   tirzepatide [facility]) 12.5 MG/0.5ML Pen Reorder     Meds ordered this encounter  Medications   tirzepatide (MOUNJARO) 12.5 MG/0.5ML Pen    Sig: Inject 12.5 mg into the skin once a week.    Dispense:  2 mL    Refill:  0    Order Specific Question:   Supervising Provider    Answer:   Dell Ponto [[id number]]      Objective:   VITALS: Per patient if applicable, see vitals. GENERAL: Alert and in no acute distress. CARDIOPULMONARY: No increased WOB. Speaking in clear sentences.  PSYCH: Pleasant and cooperative.  Speech normal rate and rhythm. Affect is appropriate. Insight and judgement are appropriate. Attention is focused, linear, and appropriate.  NEURO: Oriented as arrived to appointment on time with no prompting.   Lab Results  Component Value Date   CREATININE 0.93 04/21/2022   BUN 12 04/21/2022   NA 140 04/21/2022   K 4.1 04/21/2022   CL 102 04/21/2022   CO2 22 04/21/2022   Lab Results  Component Value Date   ALT 34 04/21/2022   AST 20 04/21/2022   ALKPHOS 65 04/21/2022   BILITOT 0.5 04/21/2022   Lab Results  Component Value Date   HGBA1C 5.7 (H) 04/21/2022   HGBA1C 5.2 07/07/2021   HGBA1C 5.8 (H) 02/03/2021   HGBA1C 5.3 06/04/2020   HGBA1C 5.2 12/10/2019   Lab Results  Component Value Date   INSULIN 14.6 04/21/2022   INSULIN 25.5 (H) 02/03/2021   INSULIN 14.2 06/04/2020   INSULIN 7.2 12/10/2019   INSULIN 32.3 (H) 07/31/2019   Lab Results  Component Value Date   TSH 1.830 04/09/2019   Lab Results  Component Value Date   CHOL 159 10/15/2021   HDL 63 10/15/2021   LDLCALC 82 10/15/2021   LDLDIRECT 224.0 08/26/2017   TRIG 69 10/15/2021   CHOLHDL 2.5 10/15/2021   Lab Results  Component Value Date   WBC 6.4 07/07/2021   HGB 17.0 07/07/2021   HCT 49.6 07/07/2021   MCV 86.6 07/07/2021   PLT 494 (H) 07/07/2021   No results found for: "IRON", "TIBC", "FERRITIN" Lab Results  Component Value Date   VD25OH 35.3 04/21/2022   VD25OH 33.5 02/03/2021   VD25OH 24.8 (L) 06/04/2020    Attestation Statements:   Reviewed by clinician on day of visit: allergies, medications, problem list, medical history, surgical history, family history, social history, and previous encounter notes.

## 2022-06-21 ENCOUNTER — Encounter (INDEPENDENT_AMBULATORY_CARE_PROVIDER_SITE_OTHER): Payer: Self-pay | Admitting: Family Medicine

## 2022-06-21 ENCOUNTER — Telehealth (INDEPENDENT_AMBULATORY_CARE_PROVIDER_SITE_OTHER): Payer: Commercial Managed Care - PPO | Admitting: Family Medicine

## 2022-06-21 ENCOUNTER — Encounter (INDEPENDENT_AMBULATORY_CARE_PROVIDER_SITE_OTHER): Payer: Self-pay | Admitting: *Deleted

## 2022-06-21 ENCOUNTER — Other Ambulatory Visit (HOSPITAL_COMMUNITY): Payer: Self-pay

## 2022-06-21 DIAGNOSIS — Z6833 Body mass index (BMI) 33.0-33.9, adult: Secondary | ICD-10-CM | POA: Diagnosis not present

## 2022-06-21 DIAGNOSIS — E785 Hyperlipidemia, unspecified: Secondary | ICD-10-CM | POA: Diagnosis not present

## 2022-06-21 DIAGNOSIS — E782 Mixed hyperlipidemia: Secondary | ICD-10-CM

## 2022-06-21 DIAGNOSIS — R7303 Prediabetes: Secondary | ICD-10-CM | POA: Diagnosis not present

## 2022-06-21 DIAGNOSIS — E669 Obesity, unspecified: Secondary | ICD-10-CM | POA: Diagnosis not present

## 2022-06-21 DIAGNOSIS — E6609 Other obesity due to excess calories: Secondary | ICD-10-CM

## 2022-06-21 MED ORDER — TIRZEPATIDE 12.5 MG/0.5ML ~~LOC~~ SOAJ
12.5000 mg | SUBCUTANEOUS | 0 refills | Status: DC
  Filled 2022-06-21: qty 2, 28d supply, fill #0

## 2022-06-22 ENCOUNTER — Other Ambulatory Visit (HOSPITAL_COMMUNITY): Payer: Self-pay

## 2022-07-15 NOTE — Progress Notes (Signed)
TeleHealth Visit:  This visit was completed with telemedicine (audio/video) technology. Bernardo has verbally consented to this TeleHealth visit. The patient is located at home, the provider is located at home. The participants in this visit include the listed provider and patient. The visit was conducted today via MyChart video.  OBESITY Alejandro [name] is here to discuss his progress with his obesity treatment plan along with follow-up of his obesity related diagnoses.    Today's visit was # 12  Starting weight: 254 lbs Starting date: 04/09/2019 Weight at clinic on 06/21/22: 217 lbs Today's reported weight: 219 lbs  Total weight loss: 35 lbs Weight change since last visit: +2  Nutrition Plan: the Category 4 Plan.   Current exercise: Exercising 5 days/week-HIT training, yoga, jogging  Interim History: He has not had Mounjaro for 4 weeks now because it was denied by insurance.  Notes increased hunger.  He feels like he was off plan during most of the month of January.  He is cooking more at home and not eating out as much. Has been consistently exercising 5 days/week.  Assessment/Plan:  1. Polyphagia Keny endorses excessive hunger.  He has been off of Mounjaro for 4 weeks due to lack of insurance coverage.  Plan: New prescription-Zepbound 7.5 mg weekly.  He says he will pay out-of-pocket if no coverage.  Advised it is $550 per month with the savings card.   2.  Mixed hyperlipidemia LDL slightly above goal at 82. Triglycerides 69, HDL 63.  He sees cardiology.  Had MI January 2023 -determined to be Takotsubo syndrome Medication(s): Crestor 40 mg daily.   Lab Results  Component Value Date   CHOL 159 10/15/2021   HDL 63 10/15/2021   LDLCALC 82 10/15/2021   LDLDIRECT 224.0 08/26/2017   TRIG 69 10/15/2021   CHOLHDL 2.5 10/15/2021   Lab Results  Component Value Date   ALT 34 04/21/2022   AST 20 04/21/2022   ALKPHOS 65 04/21/2022   BILITOT 0.5 04/21/2022   The 10-year  ASCVD risk score (Arnett DK, et al., 2019) is: 0.7%   Values used to calculate the score:     Age: 42 years     Sex: Male     Is Non-Hispanic African American: No     Diabetic: No     Tobacco smoker: No     Systolic Blood Pressure: 656 mmHg     Is BP treated: No     HDL Cholesterol: 63 mg/dL     Total Cholesterol: 159 mg/dL  Plan: Refill Crestor 40 mg daily.  3. Obesity: Current BMI 26 Jarman is currently in the action stage of change. As such, his goal is to continue with weight loss efforts.  He has agreed to following a lower carbohydrate, vegetable and lean protein rich diet plan.   Exercise goals:  as is  Behavioral modification strategies: increasing lean protein intake, decreasing simple carbohydrates, increasing vegetables, increasing water intake, and planning for success.  Copelan has agreed to follow-up with our clinic in 4 weeks.   No orders of the defined types were placed in this encounter.   Medications Discontinued During This Encounter  Medication Reason   tirzepatide (MOUNJARO) 12.5 MG/0.5ML Pen Cost of medication   rosuvastatin (CRESTOR) 40 MG tablet Reorder     Meds ordered this encounter  Medications   tirzepatide (ZEPBOUND) 7.5 MG/0.5ML Pen    Sig: Inject 7.5 mg into the skin once a week.    Dispense:  2 mL    Refill:  0    Order Specific Question:   Supervising Provider    Answer:   Dell Ponto [[id number]]   rosuvastatin (CRESTOR) 40 MG tablet    Sig: Take 1 tablet (40 mg total) by mouth daily.    Dispense:  90 tablet    Refill:  0    Order Specific Question:   Supervising Provider    Answer:   Dell Ponto [[id number]]      Objective:   VITALS: Per patient if applicable, see vitals. GENERAL: Alert and in no acute distress. CARDIOPULMONARY: No increased WOB. Speaking in clear sentences.  PSYCH: Pleasant and cooperative. Speech normal rate and rhythm. Affect is appropriate. Insight and judgement are appropriate. Attention is focused, linear, and  appropriate.  NEURO: Oriented as arrived to appointment on time with no prompting.   Lab Results  Component Value Date   CREATININE 0.93 04/21/2022   BUN 12 04/21/2022   NA 140 04/21/2022   K 4.1 04/21/2022   CL 102 04/21/2022   CO2 22 04/21/2022   Lab Results  Component Value Date   ALT 34 04/21/2022   AST 20 04/21/2022   ALKPHOS 65 04/21/2022   BILITOT 0.5 04/21/2022   Lab Results  Component Value Date   HGBA1C 5.7 (H) 04/21/2022   HGBA1C 5.2 07/07/2021   HGBA1C 5.8 (H) 02/03/2021   HGBA1C 5.3 06/04/2020   HGBA1C 5.2 12/10/2019   Lab Results  Component Value Date   INSULIN 14.6 04/21/2022   INSULIN 25.5 (H) 02/03/2021   INSULIN 14.2 06/04/2020   INSULIN 7.2 12/10/2019   INSULIN 32.3 (H) 07/31/2019   Lab Results  Component Value Date   TSH 1.830 04/09/2019   Lab Results  Component Value Date   CHOL 159 10/15/2021   HDL 63 10/15/2021   LDLCALC 82 10/15/2021   LDLDIRECT 224.0 08/26/2017   TRIG 69 10/15/2021   CHOLHDL 2.5 10/15/2021   Lab Results  Component Value Date   WBC 6.4 07/07/2021   HGB 17.0 07/07/2021   HCT 49.6 07/07/2021   MCV 86.6 07/07/2021   PLT 494 (H) 07/07/2021   No results found for: "IRON", "TIBC", "FERRITIN" Lab Results  Component Value Date   VD25OH 35.3 04/21/2022   VD25OH 33.5 02/03/2021   VD25OH 24.8 (L) 06/04/2020    Attestation Statements:   Reviewed by clinician on day of visit: allergies, medications, problem list, medical history, surgical history, family history, social history, and previous encounter notes.

## 2022-07-19 ENCOUNTER — Telehealth (INDEPENDENT_AMBULATORY_CARE_PROVIDER_SITE_OTHER): Payer: Commercial Managed Care - PPO | Admitting: Family Medicine

## 2022-07-19 ENCOUNTER — Encounter (INDEPENDENT_AMBULATORY_CARE_PROVIDER_SITE_OTHER): Payer: Self-pay | Admitting: Family Medicine

## 2022-07-19 ENCOUNTER — Telehealth: Payer: Self-pay

## 2022-07-19 ENCOUNTER — Other Ambulatory Visit (HOSPITAL_COMMUNITY): Payer: Self-pay

## 2022-07-19 DIAGNOSIS — R632 Polyphagia: Secondary | ICD-10-CM | POA: Diagnosis not present

## 2022-07-19 DIAGNOSIS — Z6833 Body mass index (BMI) 33.0-33.9, adult: Secondary | ICD-10-CM | POA: Diagnosis not present

## 2022-07-19 DIAGNOSIS — E782 Mixed hyperlipidemia: Secondary | ICD-10-CM

## 2022-07-19 DIAGNOSIS — E669 Obesity, unspecified: Secondary | ICD-10-CM

## 2022-07-19 MED ORDER — ROSUVASTATIN CALCIUM 40 MG PO TABS
40.0000 mg | ORAL_TABLET | Freq: Every day | ORAL | 0 refills | Status: DC
  Filled 2022-07-19: qty 30, 30d supply, fill #0

## 2022-07-19 MED ORDER — ZEPBOUND 7.5 MG/0.5ML ~~LOC~~ SOAJ
7.5000 mg | SUBCUTANEOUS | 0 refills | Status: DC
  Filled 2022-07-19 – 2022-09-22 (×2): qty 2, 28d supply, fill #0

## 2022-07-19 NOTE — Telephone Encounter (Signed)
PA submitted through Cover My Meds for Zepbound. Awaiting insurance determination.  Key: [id number]

## 2022-08-02 ENCOUNTER — Other Ambulatory Visit (HOSPITAL_COMMUNITY): Payer: Self-pay

## 2022-08-12 NOTE — Progress Notes (Signed)
  TeleHealth Visit:  This visit was completed with telemedicine (audio/video) technology. Alejandro [name] has verbally consented to this TeleHealth visit. The patient is located at home, the provider is located at home. The participants in this visit include the listed provider and patient. The visit was conducted today via MyChart video.  OBESITY Alejandro [name] is here to discuss his progress with his obesity treatment plan along with follow-up of his obesity related diagnoses.    Today's visit was # 67  Starting weight: 254 lbs Starting date: 04/09/2019 Weight reported at last virtual office visit: 219 lbs on 07/19/22 Weight at clinic on 08/16/22: 210 lbs Total weight loss: 44 Weight change since last visit: -9  Nutrition Plan: low carbohydrate plan- 100% on plan.  Current exercise:  Exercising 4-5 days/week-HIT training, hot yoga  Interim History: Eating three meals per day, water and vegetable intake is good.  Notes some hunger.  Alejandro [name] is: Eating all of the prescribed protein yes  Skipping meals no Drinking adequate non-caloric fluids  - yes  Hunger controlled moderately controlled. Cravings controlled moderately controlled.  Pharmacotherapy: Prescribed Zepbound 7.5 mg last OV but [facility] could not process the coupon so he has been doing without.[name]  His insurance does not cover the medication.  He tends to be "all or nothing" -with his diet.  Since the last office visit he has been 100% on the low-carb plan and is lost 9 pounds.  His goal is to lose down to 198 pounds by October of this year for a class reunion. He plans on calling the pharmacy to find out more information about the coupon. Assessment/Plan:  1. Polyphagia He has some hunger but fairly well-controlled since he has been on a low carbohydrate plan.  He would still like to try to get the Zepbound and pay out-of-pocket for it.  Plan: May take Zepbound every 10-14 days if he is able to pick it up with the  coupon.   2. Generalized Obesity: Current BMI 31  Alejandro [name] is currently in the action stage of change. As such, his goal is to continue with weight loss efforts.  He has agreed to low carbohydrate plan.  Exercise goals: as is  Behavioral modification strategies: increasing lean protein intake, decreasing simple carbohydrates , and meal planning .  Alejandro [name] has agreed to follow-up with our clinic in 4 weeks.   No orders of the defined types were placed in this encounter.   There are no discontinued medications.   No orders of the defined types were placed in this encounter.     Objective:   VITALS: Per patient if applicable, see vitals. GENERAL: Alert and in no acute distress. CARDIOPULMONARY: No increased WOB. Speaking in clear sentences.  PSYCH: Pleasant and cooperative. Speech normal rate and rhythm. Affect is appropriate. Insight and judgement are appropriate. Attention is focused, linear, and appropriate.  NEURO: Oriented as arrived to appointment on time with no prompting.   Attestation Statements:   Reviewed by clinician on day of visit: allergies, medications, problem list, medical history, surgical history, family history, social history, and previous encounter notes.  This was prepared with the assistance of [employer].  Occasional wrong-word or sound-a-like substitutions may have occurred due to the inherent limitations of voice recognition software.

## 2022-08-16 ENCOUNTER — Encounter (INDEPENDENT_AMBULATORY_CARE_PROVIDER_SITE_OTHER): Payer: Self-pay | Admitting: Family Medicine

## 2022-08-16 ENCOUNTER — Encounter (INDEPENDENT_AMBULATORY_CARE_PROVIDER_SITE_OTHER): Payer: Self-pay

## 2022-08-16 ENCOUNTER — Telehealth (INDEPENDENT_AMBULATORY_CARE_PROVIDER_SITE_OTHER): Payer: Commercial Managed Care - PPO | Admitting: Family Medicine

## 2022-08-16 VITALS — Ht 68.0 in | Wt 210.0 lb

## 2022-08-16 DIAGNOSIS — Z6831 Body mass index (BMI) 31.0-31.9, adult: Secondary | ICD-10-CM

## 2022-08-16 DIAGNOSIS — E669 Obesity, unspecified: Secondary | ICD-10-CM | POA: Diagnosis not present

## 2022-08-16 DIAGNOSIS — R632 Polyphagia: Secondary | ICD-10-CM

## 2022-09-09 NOTE — Progress Notes (Deleted)
  TeleHealth Visit:  This visit was completed with telemedicine (audio/video) technology. Alejandro [name] has verbally consented to this TeleHealth visit. The patient is located at home, the provider is located at home. The participants in this visit include the listed provider and patient. The visit was conducted today via MyChart video.  OBESITY Alejandro [name] is here to discuss his progress with his obesity treatment plan along with follow-up of his obesity related diagnoses.    Today's visit was # 18  Starting weight: 254 lbs Starting date: 04/09/2019 Weight at clinic on 08/16/22: 210 lbs Today's reported weight (09/13/22): {dwwweightreported:29243} Total weight loss: *** Weight change since last visit: ***  Nutrition Plan: low carbohydrate plan - ***% adherence.  Current exercise: {exercise types:16438}  Exercising 4-5 days/week-HIT training, hot yoga  Interim History:  *** Eating all of the prescribed protein: {yes***/no:17258} Skipping meals: {dwwyes:29172} Drinking adequate water: {dwwyes:29172} Drinking sugar sweetened beverages: {dwwyes:29172} Hunger controlled: {EWCONTROLASSESSMENT:24261}. Cravings controlled:  {EWCONTROLASSESSMENT:24261}. Journaling Consistently:  {dwwyes:29172} Meeting protein goals:  {dwwyes:29172} Meeting calorie goals:  {dwwyes:29172}  He is working on {dwwworkingon:29334}  Pharmacotherapy: Alejandro [name] is on {dwwglp:29109}. Adverse side effects: {dwwse:29122} Hunger is {EWCONTROLASSESSMENT:24261}.  Assessment/Plan:  1. ***  2. ***  3. 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  Alejandro [name] {CHL AMB IS/IS [id number] currently in the action stage of change. As such, his goal is to {MWMwtloss#1:210800005}.  He has agreed to {dwwsldiets:29085}.  Exercise goals: {MWM EXERCISE RECS:23473}  Behavioral modification strategies: {dwwslwtlossstrategies:29088}.  Alejandro [name] has agreed to  follow-up with our clinic in {NUMBER 1-10:22536} weeks.   No orders of the defined types were placed in this encounter.   There are no discontinued medications.   No orders of the defined types were placed in this encounter.     Objective:   VITALS: Per patient if applicable, see vitals. GENERAL: Alert and in no acute distress. CARDIOPULMONARY: No increased WOB. Speaking in clear sentences.  PSYCH: Pleasant and cooperative. Speech normal rate and rhythm. Affect is appropriate. Insight and judgement are appropriate. Attention is focused, linear, and appropriate.  NEURO: Oriented as arrived to appointment on time with no prompting.   Attestation Statements:   Reviewed by clinician on day of visit: allergies, medications, problem list, medical history, surgical history, family history, social history, and previous encounter notes.  ***(delete if time-based billing not used) Time spent on visit including the items listed below was *** minutes.  -preparing to see the patient (e.g., review of tests, history, previous notes) -obtaining and/or reviewing separately obtained history -counseling and educating the patient/family/caregiver -documenting clinical information in the electronic or other health record -ordering medications, tests, or procedures -independently interpreting results and communicating results to the patient/ family/caregiver -referring and communicating with other health care professionals  -care coordination    This was prepared with the assistance of [employer].  Occasional wrong-word or sound-a-like substitutions may have occurred due to the inherent limitations of voice recognition software.

## 2022-09-13 ENCOUNTER — Encounter (INDEPENDENT_AMBULATORY_CARE_PROVIDER_SITE_OTHER): Payer: Self-pay | Admitting: Family Medicine

## 2022-09-13 ENCOUNTER — Telehealth (INDEPENDENT_AMBULATORY_CARE_PROVIDER_SITE_OTHER): Payer: Commercial Managed Care - PPO | Admitting: Family Medicine

## 2022-09-13 ENCOUNTER — Telehealth (INDEPENDENT_AMBULATORY_CARE_PROVIDER_SITE_OTHER): Payer: Self-pay | Admitting: Family Medicine

## 2022-09-13 DIAGNOSIS — E669 Obesity, unspecified: Secondary | ICD-10-CM

## 2022-09-13 NOTE — Telephone Encounter (Signed)
Patient was a no-show for virtual visit. Left VM advising patient to call to reschedule.  

## 2022-09-22 ENCOUNTER — Other Ambulatory Visit (HOSPITAL_COMMUNITY): Payer: Self-pay

## 2022-11-03 ENCOUNTER — Other Ambulatory Visit (HOSPITAL_COMMUNITY): Payer: Self-pay

## 2022-11-03 ENCOUNTER — Encounter (INDEPENDENT_AMBULATORY_CARE_PROVIDER_SITE_OTHER): Payer: Self-pay | Admitting: Family Medicine

## 2022-11-03 ENCOUNTER — Telehealth (INDEPENDENT_AMBULATORY_CARE_PROVIDER_SITE_OTHER): Payer: Commercial Managed Care - PPO | Admitting: Family Medicine

## 2022-11-03 VITALS — Ht 68.0 in | Wt 230.0 lb

## 2022-11-03 DIAGNOSIS — E669 Obesity, unspecified: Secondary | ICD-10-CM

## 2022-11-03 DIAGNOSIS — E782 Mixed hyperlipidemia: Secondary | ICD-10-CM | POA: Diagnosis not present

## 2022-11-03 DIAGNOSIS — E559 Vitamin D deficiency, unspecified: Secondary | ICD-10-CM

## 2022-11-03 DIAGNOSIS — Z0000 Encounter for general adult medical examination without abnormal findings: Secondary | ICD-10-CM

## 2022-11-03 DIAGNOSIS — R632 Polyphagia: Secondary | ICD-10-CM | POA: Diagnosis not present

## 2022-11-03 DIAGNOSIS — Z6834 Body mass index (BMI) 34.0-34.9, adult: Secondary | ICD-10-CM

## 2022-11-03 DIAGNOSIS — R7303 Prediabetes: Secondary | ICD-10-CM | POA: Diagnosis not present

## 2022-11-03 MED ORDER — ROSUVASTATIN CALCIUM 40 MG PO TABS
40.0000 mg | ORAL_TABLET | Freq: Every day | ORAL | 0 refills | Status: DC
  Filled 2022-11-03: qty 30, 30d supply, fill #0

## 2022-11-03 MED ORDER — ZEPBOUND 7.5 MG/0.5ML ~~LOC~~ SOAJ
7.5000 mg | SUBCUTANEOUS | 0 refills | Status: DC
  Filled 2022-11-03 (×2): qty 2, 28d supply, fill #0

## 2022-11-03 MED ORDER — ZEPBOUND 2.5 MG/0.5ML ~~LOC~~ SOAJ
2.5000 mg | SUBCUTANEOUS | 0 refills | Status: DC
Start: 2022-11-03 — End: 2022-11-17
  Filled 2022-11-03: qty 2, 28d supply, fill #0

## 2022-11-03 NOTE — Progress Notes (Addendum)
TeleHealth Visit:  This visit was completed with telemedicine (audio/video) technology. Alejandro [name] has verbally consented to this TeleHealth visit. The patient is located at home, the provider is located at home. The participants in this visit include the listed provider and patient. The visit was conducted today via MyChart video.  OBESITY Alejandro [name] is here to discuss his progress with his obesity treatment plan along with follow-up of his obesity related diagnoses.    Today's visit was # 52  tarting weight: 254 lbs Starting date: 04/09/2019 Weight reported at last virtual office visit: 210 lbs on 08/16/22 Today's reported weight (11/03/22):  230 lbs Total weight loss: 24 lbs Weight change since last visit: +20 lbs  Nutrition Plan: low carbohydrate plan - 0% adherence.  Current exercise:  walking 45 minutes a few days per week  Interim History:  Alejandro [name] reports being totally off plan for the past 4 to 5 weeks.  His last dose of Zepbound was about 5 weeks ago.  He notes increased hunger and emotional eating. He is looking for a job currently which is very stressful. He notices that he tends to go off plan when he has stress in life.  He says that needs to clear out the junk food in the house. Has has been drinking some caloric beverages recently. He would like to start back on the low-carb plan and resume Zepbound. He will go into the office for labs and to weigh the day before his next virtual visit. Assessment/Plan:  1. Vitamin D Deficiency Vitamin D is not at goal of 50.  Most recent vitamin D level was 35 on 04/21/2022.[name] He is on  prescription ergocalciferol 50,000 IU weekly.  He has been compliant with taking this weekly. Lab Results  Component Value Date   VD25OH 35.3 04/21/2022   VD25OH 33.5 02/03/2021   VD25OH 24.8 (L) 06/04/2020    Plan: Continue  prescription ergocalciferol 50,000 IU weekly Check vitamin D level.  2. Prediabetes Last A1c was 5.7 on  04/21/2022. Medication(s): Zepbound 7.5 mg SQ weekly -last dose 5 weeks ago. Polyphagia:Yes Lab Results  Component Value Date   HGBA1C 5.7 (H) 04/21/2022   HGBA1C 5.2 07/07/2021   HGBA1C 5.8 (H) 02/03/2021   HGBA1C 5.3 06/04/2020   HGBA1C 5.2 12/10/2019   Lab Results  Component Value Date   INSULIN 14.6 04/21/2022   INSULIN 25.5 (H) 02/03/2021   INSULIN 14.2 06/04/2020   INSULIN 7.2 12/10/2019   INSULIN 32.3 (H) 07/31/2019    Plan: Continue and refill Zepbound 7.5 mg SQ weekly Advised him it is in short supply.  Will send some suggestions for pharmacies to check through MyChart. Check A1c, C-Met and fasting insulin.  3. Mixed Hyperlipidemia LDL is at goal.  Last LDL was 82, HDL normal at 63, triglycerides normal at 69. Medication(s): Crestor 40 mg daily   Lab Results  Component Value Date   CHOL 159 10/15/2021   HDL 63 10/15/2021   LDLCALC 82 10/15/2021   LDLDIRECT 224.0 08/26/2017   TRIG 69 10/15/2021   CHOLHDL 2.5 10/15/2021   CHOLHDL 2.9 09/15/2021   CHOLHDL 2.7 07/07/2021   Lab Results  Component Value Date   ALT 34 04/21/2022   AST 20 04/21/2022   ALKPHOS 65 04/21/2022   BILITOT 0.5 04/21/2022   The 10-year ASCVD risk score (Arnett DK, et al., 2019) is: 0.7%   Values used to calculate the score:     Age: 42 years     Sex: Male  Is Non-Hispanic African American: No     Diabetic: No     Tobacco smoker: No     Systolic Blood Pressure: 116 mmHg     Is BP treated: No     HDL Cholesterol: 63 mg/dL     Total Cholesterol: 159 mg/dL  Plan: Refill Crestor 40 mg daily. Check fasting lipid panel.  4. Polyphagia Currently this is poorly controlled.  Also notes stress eating. Medication(s): Mounjaro 7.5 mg SQ weekly.  Reported side effects: None Last dose 5 weeks ago. He called [facility] pharmacy and they have 2.5 mg available.  Plan: New prescription for Mounjaro 2.5 mg weekly sent to the pharmacy.  5. Generalized Obesity: Current BMI 34  Alejandro [name]  is currently in the action stage of change. As such, his goal is to continue with weight loss efforts.  He has agreed to low carbohydrate plan.  Exercise goals: walk 45 minutes 5 days per week.  Behavioral modification strategies: increasing lean protein intake, decreasing simple carbohydrates , meal planning , decrease liquid calories, increase water intake, better snacking choices, planning for success, and keep healthy foods in the home.  Alejandro [name] has agreed to follow-up with our clinic in 2 weeks.  Orders Placed This Encounter  Procedures   CBC with Differential/Platelet   Comprehensive metabolic panel   Hemoglobin A1c   Insulin, random   Lipid Panel With LDL/HDL Ratio   TSH   T4, free   T3   VITAMIN D 25 Hydroxy (Vit-D Deficiency, Fractures)    Medications Discontinued During This Encounter  Medication Reason   tirzepatide (ZEPBOUND) 7.5 MG/0.5ML Pen Reorder   rosuvastatin (CRESTOR) 40 MG tablet Reorder     Meds ordered this encounter  Medications   tirzepatide (ZEPBOUND) 7.5 MG/0.5ML Pen    Sig: Inject 7.5 mg into the skin once a week.    Dispense:  2 mL    Refill:  0    Order Specific Question:   Supervising Provider    Answer:   Glennis Brink [[id number]]   rosuvastatin (CRESTOR) 40 MG tablet    Sig: Take 1 tablet (40 mg total) by mouth daily.    Dispense:  90 tablet    Refill:  0    Order Specific Question:   Supervising Provider    Answer:   Glennis Brink [[id number]]      Objective:   VITALS: Per patient if applicable, see vitals. GENERAL: Alert and in no acute distress. CARDIOPULMONARY: No increased WOB. Speaking in clear sentences.  PSYCH: Pleasant and cooperative. Speech normal rate and rhythm. Affect is appropriate. Insight and judgement are appropriate. Attention is focused, linear, and appropriate.  NEURO: Oriented as arrived to appointment on time with no prompting.   Attestation Statements:   Reviewed by clinician on day of visit: allergies, medications,  problem list, medical history, surgical history, family history, social history, and previous encounter notes.  This was prepared with the assistance of [employer].  Occasional wrong-word or sound-a-like substitutions may have occurred due to the inherent limitations of voice recognition software.

## 2022-11-03 NOTE — Addendum Note (Signed)
Addended by: Jesse Sans on: 11/03/2022 10:29 AM   Modules accepted: Orders

## 2022-11-17 ENCOUNTER — Telehealth (INDEPENDENT_AMBULATORY_CARE_PROVIDER_SITE_OTHER): Payer: Commercial Managed Care - PPO | Admitting: Family Medicine

## 2022-11-17 ENCOUNTER — Other Ambulatory Visit (HOSPITAL_COMMUNITY): Payer: Self-pay

## 2022-11-17 ENCOUNTER — Encounter (INDEPENDENT_AMBULATORY_CARE_PROVIDER_SITE_OTHER): Payer: Self-pay | Admitting: Family Medicine

## 2022-11-17 VITALS — Ht 68.0 in | Wt 220.0 lb

## 2022-11-17 DIAGNOSIS — Z6833 Body mass index (BMI) 33.0-33.9, adult: Secondary | ICD-10-CM

## 2022-11-17 DIAGNOSIS — E669 Obesity, unspecified: Secondary | ICD-10-CM | POA: Diagnosis not present

## 2022-11-17 DIAGNOSIS — R632 Polyphagia: Secondary | ICD-10-CM

## 2022-11-17 DIAGNOSIS — E559 Vitamin D deficiency, unspecified: Secondary | ICD-10-CM

## 2022-11-17 MED ORDER — VITAMIN D (ERGOCALCIFEROL) 1.25 MG (50000 UNIT) PO CAPS
50000.0000 [IU] | ORAL_CAPSULE | ORAL | 0 refills | Status: DC
Start: 2022-11-17 — End: 2022-12-20
  Filled 2022-11-17: qty 4, 28d supply, fill #0

## 2022-11-17 MED ORDER — ZEPBOUND 5 MG/0.5ML ~~LOC~~ SOAJ
5.0000 mg | SUBCUTANEOUS | 0 refills | Status: DC
Start: 2022-11-17 — End: 2022-12-20
  Filled 2022-11-17: qty 2, 28d supply, fill #0

## 2022-11-17 MED ORDER — ZEPBOUND 2.5 MG/0.5ML ~~LOC~~ SOAJ
2.5000 mg | SUBCUTANEOUS | 0 refills | Status: DC
Start: 2022-11-17 — End: 2022-12-01
  Filled 2022-11-17: qty 2, 28d supply, fill #0

## 2022-11-17 NOTE — Progress Notes (Signed)
TeleHealth Visit:  This visit was completed with telemedicine (audio/video) technology. Alejandro [name] has verbally consented to this TeleHealth visit. The patient is located at home, the provider is located at home. The participants in this visit include the listed provider and patient. The visit was conducted today via MyChart video.  OBESITY [name] is here to discuss his progress with his obesity treatment plan along with follow-up of his obesity related diagnoses.    Today's visit was # 53  Starting weight: 254 lbs Starting date: 04/09/2019 Weight reported at last virtual office visit: 230 lbs on 11/03/22 Weight at clinic on 11/17/22: 220 lbs Total weight loss: 34 lbs Weight change since last visit: 10 lbs  Nutrition Plan: low carbohydrate plan - 0% adherence.   Current exercise:  walking 45 minutes a few days per week  Nutrition Plan: low carbohydrate plan  Current exercise: walking 4 days per week for 60 minutes.   Interim History:  He has lost 10 lbs since his last visit. He is following low carb. He is going to [employer] (baseball tournament)and [id number]. He is concerned about sticking to plan. Pharmacy was out of 5 mg Zepbound so we switched to 2.5 mg. Vegetables and water intake is good.  He gets a lot of walking in at his son's all practices and games.  Pharmacotherapy: Bejamin is on Zepbound 2.5 mg SQ weekly. Adverse side effects: None Hunger is moderately controlled.  Assessment/Plan:  1. Polyphagia Currently this is moderately controlled. Medication(s): Zepbound 2.5 mg SQ weekly.  Reported side effects: None  Plan: Continue and refill Zepbound 2.5 mg SQ weekly I am also sending the 5 mg dose in case the pharmacy has this available.   2. Vitamin D Deficiency Vitamin D is not at goal of 50.  Most recent vitamin D level was 35 on 04/21/2022.[name] He is on  prescription ergocalciferol 50,000 IU weekly. Lab Results  Component Value Date   VD25OH 35.3  04/21/2022   VD25OH 33.5 02/03/2021   VD25OH 24.8 (L) 06/04/2020    Plan: Continue and refill  prescription ergocalciferol 50,000 IU weekly He had labs drawn today at the office.  3. Generalized Obesity: Current BMI 30 Brain is currently in the action stage of change. As such, his goal is to continue with weight loss efforts.  He has agreed to low carbohydrate plan.  Discussed best things to eat for low-carb plan while traveling.  Exercise goals:  as is  Behavioral modification strategies: increasing lean protein intake, decreasing simple carbohydrates , and travel eating strategies.  [name] has agreed to follow-up with our clinic in 4 weeks.  No orders of the defined types were placed in this encounter.   Medications Discontinued During This Encounter  Medication Reason   tirzepatide (ZEPBOUND) 2.5 MG/0.5ML Pen Reorder   Vitamin D, Ergocalciferol, (DRISDOL) 1.25 MG (50000 UNIT) CAPS capsule Reorder   escitalopram (LEXAPRO) 20 MG tablet Patient Preference   traZODone (DESYREL) 50 MG tablet Patient Preference   Vilazodone HCl (VIIBRYD) 40 MG TABS Patient has not taken in last 30 days   benzonatate (TESSALON) 100 MG capsule Patient Preference     Meds ordered this encounter  Medications   tirzepatide (ZEPBOUND) 2.5 MG/0.5ML Pen    Sig: Inject 2.5 mg into the skin once a week.    Dispense:  2 mL    Refill:  0    Order Specific Question:   Supervising Provider    Answer:   Glennis Brink [[id number]]   tirzepatide (ZEPBOUND)  5 MG/0.5ML Pen    Sig: Inject 5 mg into the skin once a week.    Dispense:  2 mL    Refill:  0    Order Specific Question:   Supervising Provider    Answer:   Carolin Sicks   Vitamin D, Ergocalciferol, (DRISDOL) 1.25 MG (50000 UNIT) CAPS capsule    Sig: Take 1 capsule (50,000 Units total) by mouth every 7 (seven) days.    Dispense:  12 capsule    Refill:  0    Order Specific Question:   Supervising Provider    Answer:   Glennis Brink [[id number]]       Objective:   VITALS: Per patient if applicable, see vitals. GENERAL: Alert and in no acute distress. CARDIOPULMONARY: No increased WOB. Speaking in clear sentences.  PSYCH: Pleasant and cooperative. Speech normal rate and rhythm. Affect is appropriate. Insight and judgement are appropriate. Attention is focused, linear, and appropriate.  NEURO: Oriented as arrived to appointment on time with no prompting.   Attestation Statements:   Reviewed by clinician on day of visit: allergies, medications, problem list, medical history, surgical history, family history, social history, and previous encounter notes.   This was prepared with the assistance of [employer].  Occasional wrong-word or sound-a-like substitutions may have occurred due to the inherent limitations of voice recognition software.

## 2022-11-18 LAB — CBC WITH DIFFERENTIAL/PLATELET
Basophils Absolute: 0.1 10*3/uL (ref 0.0–0.2)
Basos: 2 %
EOS (ABSOLUTE): 0.1 10*3/uL (ref 0.0–0.4)
Eos: 1 %
Hematocrit: 46.6 % (ref 37.5–51.0)
Hemoglobin: 15.2 g/dL (ref 13.0–17.7)
Immature Grans (Abs): 0 10*3/uL (ref 0.0–0.1)
Immature Granulocytes: 1 %
Lymphocytes Absolute: 2.7 10*3/uL (ref 0.7–3.1)
Lymphs: 47 %
MCH: 29 pg (ref 26.6–33.0)
MCHC: 32.6 g/dL (ref 31.5–35.7)
MCV: 89 fL (ref 79–97)
Monocytes Absolute: 0.5 10*3/uL (ref 0.1–0.9)
Monocytes: 8 %
Neutrophils Absolute: 2.4 10*3/uL (ref 1.4–7.0)
Neutrophils: 41 %
Platelets: 369 10*3/uL (ref 150–450)
RBC: 5.24 x10E6/uL (ref 4.14–5.80)
RDW: 13 % (ref 11.6–15.4)
WBC: 5.8 10*3/uL (ref 3.4–10.8)

## 2022-11-18 LAB — COMPREHENSIVE METABOLIC PANEL
ALT: 22 IU/L (ref 0–44)
AST: 15 IU/L (ref 0–40)
Albumin/Globulin Ratio: 2 (ref 1.2–2.2)
Albumin: 4.4 g/dL (ref 4.1–5.1)
Alkaline Phosphatase: 58 IU/L (ref 44–121)
BUN/Creatinine Ratio: 15 (ref 9–20)
BUN: 14 mg/dL (ref 6–24)
Bilirubin Total: 0.3 mg/dL (ref 0.0–1.2)
CO2: 22 mmol/L (ref 20–29)
Calcium: 9.1 mg/dL (ref 8.7–10.2)
Chloride: 107 mmol/L — ABNORMAL HIGH (ref 96–106)
Creatinine, Ser: 0.92 mg/dL (ref 0.76–1.27)
Globulin, Total: 2.2 g/dL (ref 1.5–4.5)
Glucose: 99 mg/dL (ref 70–99)
Potassium: 4.4 mmol/L (ref 3.5–5.2)
Sodium: 143 mmol/L (ref 134–144)
Total Protein: 6.6 g/dL (ref 6.0–8.5)
eGFR: 106 mL/min/{1.73_m2} (ref >59)

## 2022-11-18 LAB — VITAMIN D 25 HYDROXY (VIT D DEFICIENCY, FRACTURES): Vit D, 25-Hydroxy: 40 ng/mL (ref 30.0–100.0)

## 2022-11-18 LAB — LIPID PANEL WITH LDL/HDL RATIO
Cholesterol, Total: 156 mg/dL (ref 100–199)
HDL: 53 mg/dL (ref >39)
LDL Chol Calc (NIH): 80 mg/dL (ref 0–99)
LDL/HDL Ratio: 1.5 ratio (ref 0.0–3.6)
Triglycerides: 133 mg/dL (ref 0–149)
VLDL Cholesterol Cal: 23 mg/dL (ref 5–40)

## 2022-11-18 LAB — T4, FREE: Free T4: 1.37 ng/dL (ref 0.82–1.77)

## 2022-11-18 LAB — TSH: TSH: 1.18 u[IU]/mL (ref 0.450–4.500)

## 2022-11-18 LAB — HEMOGLOBIN A1C
Est. average glucose Bld gHb Est-mCnc: 120 mg/dL
Hgb A1c MFr Bld: 5.8 % — ABNORMAL HIGH (ref 4.8–5.6)

## 2022-11-18 LAB — T3: T3, Total: 128 ng/dL (ref 71–180)

## 2022-11-18 LAB — INSULIN, RANDOM: INSULIN: 30.3 u[IU]/mL — ABNORMAL HIGH (ref 2.6–24.9)

## 2022-11-29 ENCOUNTER — Other Ambulatory Visit (HOSPITAL_COMMUNITY): Payer: Self-pay

## 2022-12-01 ENCOUNTER — Ambulatory Visit (HOSPITAL_COMMUNITY)
Admission: RE | Admit: 2022-12-01 | Discharge: 2022-12-01 | Disposition: A | Discharge status: Home / Self Care | Payer: Commercial Managed Care - PPO | Source: Ambulatory Visit | Attending: Family Medicine | Admitting: Family Medicine

## 2022-12-01 ENCOUNTER — Encounter (HOSPITAL_COMMUNITY): Payer: Self-pay

## 2022-12-01 VITALS — BP 120/70 | HR 82 | Wt 219.6 lb

## 2022-12-01 DIAGNOSIS — I5181 Takotsubo syndrome: Secondary | ICD-10-CM | POA: Diagnosis not present

## 2022-12-01 DIAGNOSIS — E782 Mixed hyperlipidemia: Secondary | ICD-10-CM

## 2022-12-01 DIAGNOSIS — Z6830 Body mass index (BMI) 30.0-30.9, adult: Secondary | ICD-10-CM

## 2022-12-01 DIAGNOSIS — R0789 Other chest pain: Secondary | ICD-10-CM | POA: Diagnosis present

## 2022-12-01 DIAGNOSIS — I252 Old myocardial infarction: Secondary | ICD-10-CM | POA: Insufficient documentation

## 2022-12-01 DIAGNOSIS — R079 Chest pain, unspecified: Secondary | ICD-10-CM

## 2022-12-01 DIAGNOSIS — Z79899 Other long term (current) drug therapy: Secondary | ICD-10-CM | POA: Diagnosis not present

## 2022-12-01 DIAGNOSIS — Z8249 Family history of ischemic heart disease and other diseases of the circulatory system: Secondary | ICD-10-CM | POA: Insufficient documentation

## 2022-12-01 DIAGNOSIS — E669 Obesity, unspecified: Secondary | ICD-10-CM | POA: Insufficient documentation

## 2022-12-01 DIAGNOSIS — E785 Hyperlipidemia, unspecified: Secondary | ICD-10-CM | POA: Insufficient documentation

## 2022-12-01 NOTE — Addendum Note (Signed)
Encounter addended by: Dolores Patty, MD on: 12/01/2022 6:43 PM  Actions taken: Clinical Note Signed, Level of Service modified

## 2022-12-01 NOTE — Patient Instructions (Signed)
Medication Changes:  No Changes In Medications at this time.    Follow-Up in:   Your physician recommends that you schedule a follow-up appointment in: 1 YEAR WITH DR. Shirlee Latch  PLEASE CALL OUR OFFICE WITH ANY CONCERNS [phone] OPTION 2   At [facility], you and your health needs are our priority. We have a designated team specialized in the treatment of Heart Failure. This Care Team includes your primary Heart Failure Specialized Cardiologist (physician), Advanced Practice Providers (APPs- Physician Assistants and Nurse Practitioners), and Pharmacist who all work together to provide you with the care you need, when you need it.   You may see any of the following providers on your designated Care Team at your next follow up:  Dr. Arvilla Meres Dr. Marca Ancona Dr. Marcos Eke, NP Robbie Lis, [place] [facility] [place], [place] Brynda Peon, NP Karle Plumber, PharmD   Please be sure to bring in all your medications bottles to every appointment.   Need to Contact us:  If you have any questions or concerns before your next appointment please send [place] a message through [place] or call our office at [phone].    TO LEAVE A MESSAGE FOR THE NURSE SELECT OPTION 2, PLEASE LEAVE A MESSAGE INCLUDING: YOUR NAME DATE OF BIRTH CALL BACK NUMBER REASON FOR CALL**this is important as we prioritize the call backs  YOU WILL RECEIVE A CALL BACK THE SAME DAY AS LONG AS YOU CALL BEFORE 4:00 PM

## 2022-12-01 NOTE — Progress Notes (Addendum)
PCP: Joaquim Nam, MD Cardiology: Dr. Shirlee Latch  42 y.o. with history of hyperlipidemia, obesity and depression/anxiety.    He lost his job right before Christmas 2022 and had been under a lot of stress.  He began to note episodes of chest pain after this, mild squeezing in the central chest with stress.  He went skiing with his kids in 1/23.  At the top of a slope, he developed severe substernal chest tightness that did not relent.  He was taken to the hospital in [place] by EMS, ECG abnormalities were noted.  Troponin was mildly elevated. CTA chest showed no PE or dissection.  He had LHC, showing no significant coronary disease but there was apical dyskinesis and overall EF 60%.  This was thought to be consistent with stress (Takotsubo-type) cardiomyopathy.    Echo was done in 1/23.  This showed EF 55-60%, severe hypokinesis in the mid septum.    Cardiac MRI in 2/23 showed LV EF 55% with mild hypokinesis in the mid septum, normal RV, no LGE.   Echo 05/23: EF 60-65%, normal diastolic function with normal GLS, no regional wall motion abnormalities, normal RV.   He is here today for follow-up. Reports episodes of central chest pain radiating to left shoulder over the last week. Symptoms initially started while walking in the heat at his son's baseball tournament in [place], [place]. Symptoms usually last 5 minutes in duration and resolve on their own. Discomfort often occurs with exertion but has also come on at rest. Symptoms are similar to the discomfort he felt with prior Takotsubo event but not as severe. Notes some associated dizziness but no presyncope or syncope. He has been under a great deal of stress.   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  He has a family history of CAD/MI, no h/o cardiomyopathy.  He does not smoke or drink ETOH.   PMH: 1. Hyperlipidemia 2. Depression/anxiety 3. Pre-diabetes 4. H/o ruptured disc in back. 5.  Stress (Takotsubo-type) cardiomyopathy: NSTEMI in 1/23 while skiing.   - LHC (1/23): No significant coronary disease, apical dyskinesis on LV-gram with EF 60%.  - Echo (1/23): EF 55-60%, severe hypokinesis in the mid septum - Cardiac MRI (2/23): LV EF 55% with mild hypokinesis in the mid septum, normal RV, no LGE.  - Echo (5/23): EF 60-65%, normal diastolic function with normal GLS, no regional wall motion abnormalities, normal RV.   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  No smoking, no ETOH.   FH: Grandfather with MI in his 53s, other grandfather with MI in his 28s.   ROS: All systems reviewed and negative except as per HPI.   Current Outpatient Medications  Medication Sig Dispense Refill   ALPRAZolam (XANAX) 0.25 MG tablet Take 1 tablet (0.25 mg total) by mouth 2 (two) times daily as needed for anxiety. 20 tablet 0   cyclobenzaprine (FLEXERIL) 10 MG tablet Take 10 mg by mouth as needed.     rosuvastatin (CRESTOR) 40 MG tablet Take 1 tablet (40 mg total) by mouth daily. 90 tablet 0   tirzepatide (ZEPBOUND) 5 MG/0.5ML Pen Inject 5 mg into the skin once a week. 2 mL 0   Vitamin D, Ergocalciferol, (DRISDOL) 1.25 MG (50000 UNIT) CAPS capsule Take 1 capsule (50,000 Units total) by mouth every 7 (seven) days. 12 capsule 0   No current facility-administered medications for this encounter.   Blood pressure 120/70, pulse 82, weight 99.6  kg (219 lb 9.6 oz), SpO2 93 %. General:  Well appearing. No distress. HEENT: normal Neck: supple. no JVD. Carotids 2+ bilat; no bruits.  Cor: PMI nondisplaced. Regular rate & rhythm. No rubs, gallops or murmurs. Lungs: clear Abdomen: soft, nontender, nondistended.  Extremities: no cyanosis, clubbing, rash, edema Neuro: alert & orientedx3. Affect pleasant  ECG: SR 83 bpm, LVH with secondary repolarization abnormality  Assessment/Plan: 1. Chest pain/history of Takotsubo Cardiomyopathy: Patient presented with NSTEMI in 1/23, cath showed no  significant coronary disease with LV-gram showing EF 60% but apical dyskinesis.  Given significant stressors and anxiety, this episode most likely represents a stress (Takotsubo-type) cardiomyopathy.  No viral prodrome to suggest viral myocarditis.  Echo in 1/23 showed LV EF 55-60% with severe hypokinesis in the mid septum, which is an unusual pattern for Takotsubo.  Cardiac MRI 2/23 showed EF 59% with very mild mid septal hypokinesis.  There was no delayed enhancement, so improving wall motion abnormality + lack of scar does suggest a stress cardiomyopathy. Fitting this diagnosis, echo 05/23 showed normal LV systolic function and wall motion. He is off Coreg - Now presenting with recurrent chest discomfort with some typical and atypical features. ECG with no acute changes. Dr. Gala Romney performed echo at bedside. EF is 60-65%, no WMA, RV okay. No evidence of recurrent Takotsubo cardiomyopathy. Reassurance provided. Reviewed with Dr. Shirlee Latch. - He needs to work on stress management/anxiety control. This was discussed today. 2. Hyperlipidemia: Given strong family history of CAD (GF with MI in early 30s), would like to see LDL < 70.  - Recent LDL was 80. Is on 40 mg Rosuvastatin daily. Consider zetia if LDL not below 70 on next check. 3. Depression/anxiety: Had been seeing Psychologist, recommended he reestablish. 4. Obesity: [facility] and is taking tirzepatide. Has lost about 30 lb overall, weight loss recently stalled d/t his diet while traveling. He is planning to get back on track.  Followup 1 year, sooner for any concerns  [facility], LINDSAY N 12/01/2022  Patient seen and examined with the above-signed Advanced Practice Provider and/or Housestaff. I personally reviewed laboratory data, imaging studies and relevant notes. I independently examined the patient and formulated the important aspects of the plan. I have edited the note to reflect any of my changes or salient  points. I have personally discussed the plan with the patient and/or family.  42 y/o male as above with h/o HTN and previous Tako-tsubo CM (normal cors on cath 1/23) presents today for unscheduled visit due to CP reminiscent of previous Tako-tsubo presentation.   ECG with SR LVH with repol.   Exam unmarkable.   I performed bedside echo   EF 60-65% No RWMA. RV ok.   Suspect anxiety is major driving feature. I d/w Dr. Shirlee Latch personally. Will check labs including hstrop.   Total time spent 40 minutes. Reviewing previous studies and seeing patient.    Arvilla Meres, MD  6:43 PM

## 2022-12-15 NOTE — Progress Notes (Signed)
TeleHealth Visit:  This visit was completed with telemedicine (audio/video) technology. Alejandro [name] has verbally consented to this TeleHealth visit. The patient is located at home, the provider is located at home. The participants in this visit include the listed provider and patient. The visit was conducted today via MyChart video.  OBESITY Alejandro [name] is here to discuss his progress with his obesity treatment plan along with follow-up of his obesity related diagnoses.    Today's visit was # 54  Starting weight: 254 lbs Starting date: 04/09/2019 Weight reported at last virtual office visit: 220 lbs on 11/17/22 Weight at clinic today: 210 lbs Total weight loss: 44 lbs Weight change since last visit: -10 lbs  Nutrition Plan: low carbohydrate plan   Current exercise:  walking 45 minutes 4 days per week  Interim History:  He is down 10 lbs from his last visit. He has highly motivated for the last 3 weeks. Her is following the low carb plan and also eating plenty of vegetables. He is cooking a lot  and exploring new recipes/health exercise He feels the Zepbound 5 mg is working great for him.  Food noise is negligible.  He is taking it weekly.  He is walking for exercise. He has tended to over exercise in the past.-sometimes 2 exercise sessions per day.  Admits to and "addictive" personality.  Assessment/Plan:  We discussed recent lab results in depth.  1. Hyperlipidemia LDL is at goal.  Labs completed 11/17/2022: LDL at goal at 80, HDL at goal at 53, triglycerides normal at 133. Medication(s): Crestor 40 mg daily.  Does not report myalgias.  Lab Results  Component Value Date   CHOL 156 11/17/2022   HDL 53 11/17/2022   LDLCALC 80 11/17/2022   LDLDIRECT 224.0 08/26/2017   TRIG 133 11/17/2022   CHOLHDL 2.5 10/15/2021   CHOLHDL 2.9 09/15/2021   CHOLHDL 2.7 07/07/2021   Lab Results  Component Value Date   ALT 22 11/17/2022   AST 15 11/17/2022   ALKPHOS 58 11/17/2022   BILITOT  0.3 11/17/2022   The 10-year ASCVD risk score (Arnett DK, et al., 2019) is: 0.6%   Values used to calculate the score:     Age: 42 years     Sex: Male     Is Non-Hispanic African American: No     Diabetic: No     Tobacco smoker: No     Systolic Blood Pressure: 102 mmHg     Is BP treated: No     HDL Cholesterol: 53 mg/dL     Total Cholesterol: 156 mg/dL  Plan: Refill Crestor 40 mg daily.  2. Vitamin D Deficiency Vitamin D is not at goal of 50.  Most recent vitamin D level was 40, improved from 35 last November.[name] He is on  prescription ergocalciferol 50,000 IU weekly. Lab Results  Component Value Date   VD25OH 40.0 11/17/2022   VD25OH 35.3 04/21/2022   VD25OH 33.5 02/03/2021    Plan: Continue and refill  prescription ergocalciferol 50,000 IU weekly  3. Polyphagia Currently this is moderately controlled. Medication(s): Zepbound 5.0 mg SQ weekly.  Reported side effects: None  Plan: Continue and increase dose Zepbound 7.5 mg SQ weekly   Generalized Obesity: Current BMI 31 [name] is currently in the action stage of change. As such, his goal is to continue with weight loss efforts.  He has agreed to low carbohydrate plan.  Exercise goals: Continue walking for exercise. Add in resistance training 2-3 times per week-suggested crunches and push-ups.  Behavioral modification strategies: planning for success.  Jazmon has agreed to follow-up with our clinic in 4 weeks.  No orders of the defined types were placed in this encounter.   Medications Discontinued During This Encounter  Medication Reason   tirzepatide (ZEPBOUND) 5 MG/0.5ML Pen    rosuvastatin (CRESTOR) 40 MG tablet Reorder   Vitamin D, Ergocalciferol, (DRISDOL) 1.25 MG (50000 UNIT) CAPS capsule Reorder     Meds ordered this encounter  Medications   rosuvastatin (CRESTOR) 40 MG tablet    Sig: Take 1 tablet (40 mg total) by mouth daily.    Dispense:  90 tablet    Refill:  0   Vitamin D, Ergocalciferol,  (DRISDOL) 1.25 MG (50000 UNIT) CAPS capsule    Sig: Take 1 capsule (50,000 Units total) by mouth every 7 (seven) days.    Dispense:  12 capsule    Refill:  0   tirzepatide (ZEPBOUND) 7.5 MG/0.5ML Pen    Sig: Inject 7.5 mg into the skin once a week.    Dispense:  2 mL    Refill:  0    Order Specific Question:   Supervising Provider    Answer:   Glennis Brink [[id number]]      Objective:   VITALS: Per patient if applicable, see vitals. GENERAL: Alert and in no acute distress. CARDIOPULMONARY: No increased WOB. Speaking in clear sentences.  PSYCH: Pleasant and cooperative. Speech normal rate and rhythm. Affect is appropriate. Insight and judgement are appropriate. Attention is focused, linear, and appropriate.  NEURO: Oriented as arrived to appointment on time with no prompting.   Attestation Statements:   Reviewed by clinician on day of visit: allergies, medications, problem list, medical history, surgical history, family history, social history, and previous encounter notes.  This was prepared with the assistance of [employer].  Occasional wrong-word or sound-a-like substitutions may have occurred due to the inherent limitations of voice recognition software.

## 2022-12-20 ENCOUNTER — Encounter (INDEPENDENT_AMBULATORY_CARE_PROVIDER_SITE_OTHER): Payer: Self-pay | Admitting: Family Medicine

## 2022-12-20 ENCOUNTER — Telehealth (INDEPENDENT_AMBULATORY_CARE_PROVIDER_SITE_OTHER): Payer: Commercial Managed Care - PPO | Admitting: Family Medicine

## 2022-12-20 ENCOUNTER — Other Ambulatory Visit (HOSPITAL_COMMUNITY): Payer: Self-pay

## 2022-12-20 VITALS — BP 102/68 | HR 71 | Temp 97.8°F | Ht 68.0 in | Wt 210.0 lb

## 2022-12-20 DIAGNOSIS — E559 Vitamin D deficiency, unspecified: Secondary | ICD-10-CM | POA: Diagnosis not present

## 2022-12-20 DIAGNOSIS — R632 Polyphagia: Secondary | ICD-10-CM | POA: Diagnosis not present

## 2022-12-20 DIAGNOSIS — E669 Obesity, unspecified: Secondary | ICD-10-CM | POA: Diagnosis not present

## 2022-12-20 DIAGNOSIS — Z6831 Body mass index (BMI) 31.0-31.9, adult: Secondary | ICD-10-CM

## 2022-12-20 DIAGNOSIS — E785 Hyperlipidemia, unspecified: Secondary | ICD-10-CM

## 2022-12-20 DIAGNOSIS — E782 Mixed hyperlipidemia: Secondary | ICD-10-CM

## 2022-12-20 MED ORDER — ROSUVASTATIN CALCIUM 40 MG PO TABS
40.0000 mg | ORAL_TABLET | Freq: Every day | ORAL | 0 refills | Status: DC
Start: 2022-12-20 — End: 2023-02-16
  Filled 2022-12-20: qty 30, 30d supply, fill #0

## 2022-12-20 MED ORDER — ZEPBOUND 7.5 MG/0.5ML ~~LOC~~ SOAJ
7.5000 mg | SUBCUTANEOUS | 0 refills | Status: DC
  Filled 2022-12-20 – 2022-12-29 (×2): qty 2, 28d supply, fill #0

## 2022-12-20 MED ORDER — VITAMIN D (ERGOCALCIFEROL) 1.25 MG (50000 UNIT) PO CAPS
50000.0000 [IU] | ORAL_CAPSULE | ORAL | 0 refills | Status: DC
Start: 2022-12-20 — End: 2023-01-17
  Filled 2022-12-20: qty 12, 84d supply, fill #0

## 2022-12-29 ENCOUNTER — Other Ambulatory Visit (HOSPITAL_COMMUNITY): Payer: Self-pay

## 2023-01-16 NOTE — Progress Notes (Unsigned)
TeleHealth Visit:  This visit was completed with telemedicine (audio/video) technology. Alejandro [name] has verbally consented to this TeleHealth visit. The patient is located at home, the provider is located at home. The participants in this visit include the listed provider and patient. The visit was conducted today via MyChart video.  OBESITY Alejandro [name] is here to discuss his progress with his obesity treatment plan along with follow-up of his obesity related diagnoses.    Today's visit was # 55  Starting weight: 254 lbs Starting date: 04/09/2019 Weight at clinic on 12/20/22: 210 lbs Weight today is 208 lbs. Total weight loss: 46 lbs Weight change since last visit: -2 lbs   Nutrition Plan: low carbohydrate plan    Current exercise:  walking 45 minutes 4 days per week  Interim History:  He is eating low carb mostly but not strictly.  He feels he has been doing well with his eating. He got a job recently at [employer] working in [employer]. He is excited because it has good benefits, a cafeteria, and a gym. Starts mid August. He is hopeful that he can begin doing workouts with the equipment at the gym. Kid are getting ready to restart practice which will make things more difficult for planning dinner. He is paying out-of-pocket for Zepbound 7.5 mg weekly.  Feels it is helping but he thinks he may have missed some of the dose for some of his injections due to injection technique.  Pharmacotherapy: Alejandro [name] is on Zepbound 7.5 mg SQ weekly. Adverse side effects: Nausea Hunger is well controlled.  Assessment/Plan:  1. Takotsubo Syndrome LDL is not at goal. Goal is < 70 per cardiology.  On Crestor 40 mg.  Saw cardiology on June 19.  Had experienced chest pain while on vacation in [place] in June.  Was determined to have no cardiac issues that caused the chest pain. Had non-STEMI MI in January 2023.  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  Ejection fraction normal and no blockage  revealed on cath. Medication(s): Crestor 40 mg daily.  Cardiology mentioned adding Zetia if LDL not below 70 at next check.  Lab Results  Component Value Date   CHOL 156 11/17/2022   HDL 53 11/17/2022   LDLCALC 80 11/17/2022   LDLDIRECT 224.0 08/26/2017   TRIG 133 11/17/2022   CHOLHDL 2.5 10/15/2021   CHOLHDL 2.9 09/15/2021   CHOLHDL 2.7 07/07/2021   Lab Results  Component Value Date   ALT 22 11/17/2022   AST 15 11/17/2022   ALKPHOS 58 11/17/2022   BILITOT 0.3 11/17/2022   The 10-year ASCVD risk score (Arnett DK, et al., 2019) is: 0.6%   Values used to calculate the score:     Age: 42 years     Sex: Male     Is Non-Hispanic African American: No     Diabetic: No     Tobacco smoker: No     Systolic Blood Pressure: 102 mmHg     Is BP treated: No     HDL Cholesterol: 53 mg/dL     Total Cholesterol: 156 mg/dL  Plan: Continue Crestor 40 mg daily. Recheck lipid panel in 2 to 3 months.  2. Vitamin D Deficiency Vitamin D is not at goal of 50.  Most recent vitamin D level was 40. He is on  prescription ergocalciferol 50,000 IU weekly. Lab Results  Component Value Date   VD25OH 40.0 11/17/2022   VD25OH 35.3 04/21/2022   VD25OH 33.5 02/03/2021    Plan: Continue and refill  prescription ergocalciferol 50,000 IU weekly Recheck vitamin D level in 2 to 3 months.  3. Generalized Obesity: Current BMI 38  Pharmacotherapy Plan Continue and increase dose Zepbound 10 mg SQ weekly.   Alejandro [name] is currently in the action stage of change. As such, his goal is to continue with weight loss efforts.  He has agreed to low carbohydrate plan.  Discussed how to inject the medicine properly.  Exercise goals: Continue walking.  Add resistance training once he starts his new job.  Behavioral modification strategies: increasing lean protein intake, decreasing simple carbohydrates , and planning for success.  Alejandro [name] has agreed to follow-up with our clinic in 4 weeks.  No orders of the  defined types were placed in this encounter.   Medications Discontinued During This Encounter  Medication Reason   tirzepatide (ZEPBOUND) 7.5 MG/0.5ML Pen Dose change   Vitamin D, Ergocalciferol, (DRISDOL) 1.25 MG (50000 UNIT) CAPS capsule Reorder     Meds ordered this encounter  Medications   tirzepatide (ZEPBOUND) 10 MG/0.5ML Pen    Sig: Inject 10 mg into the skin once a week.    Dispense:  2 mL    Refill:  0    Order Specific Question:   Supervising Provider    Answer:   Carolin Sicks   Vitamin D, Ergocalciferol, (DRISDOL) 1.25 MG (50000 UNIT) CAPS capsule    Sig: Take 1 capsule (50,000 Units total) by mouth every 7 (seven) days.    Dispense:  12 capsule    Refill:  0    Order Specific Question:   Supervising Provider    Answer:   Glennis Brink [[id number]]      Objective:   VITALS: Per patient if applicable, see vitals. GENERAL: Alert and in no acute distress. CARDIOPULMONARY: No increased WOB. Speaking in clear sentences.  PSYCH: Pleasant and cooperative. Speech normal rate and rhythm. Affect is appropriate. Insight and judgement are appropriate. Attention is focused, linear, and appropriate.  NEURO: Oriented as arrived to appointment on time with no prompting.   Attestation Statements:   Reviewed by clinician on day of visit: allergies, medications, problem list, medical history, surgical history, family history, social history, and previous encounter notes.   This was prepared with the assistance of [employer].  Occasional wrong-word or sound-a-like substitutions may have occurred due to the inherent limitations of voice recognition software.

## 2023-01-17 ENCOUNTER — Telehealth (INDEPENDENT_AMBULATORY_CARE_PROVIDER_SITE_OTHER): Payer: Commercial Managed Care - PPO | Admitting: Family Medicine

## 2023-01-17 ENCOUNTER — Encounter (INDEPENDENT_AMBULATORY_CARE_PROVIDER_SITE_OTHER): Payer: Self-pay | Admitting: Family Medicine

## 2023-01-17 ENCOUNTER — Other Ambulatory Visit (HOSPITAL_COMMUNITY): Payer: Self-pay

## 2023-01-17 ENCOUNTER — Other Ambulatory Visit: Payer: Self-pay

## 2023-01-17 VITALS — Ht 68.0 in | Wt 208.0 lb

## 2023-01-17 DIAGNOSIS — Z6838 Body mass index (BMI) 38.0-38.9, adult: Secondary | ICD-10-CM | POA: Diagnosis not present

## 2023-01-17 DIAGNOSIS — E669 Obesity, unspecified: Secondary | ICD-10-CM | POA: Diagnosis not present

## 2023-01-17 DIAGNOSIS — I5181 Takotsubo syndrome: Secondary | ICD-10-CM | POA: Diagnosis not present

## 2023-01-17 DIAGNOSIS — E559 Vitamin D deficiency, unspecified: Secondary | ICD-10-CM | POA: Diagnosis not present

## 2023-01-17 DIAGNOSIS — Z6831 Body mass index (BMI) 31.0-31.9, adult: Secondary | ICD-10-CM

## 2023-01-17 MED ORDER — ZEPBOUND 10 MG/0.5ML ~~LOC~~ SOAJ
10.0000 mg | SUBCUTANEOUS | 0 refills | Status: DC
Start: 2023-01-17 — End: 2023-02-16
  Filled 2023-01-17: qty 2, 28d supply, fill #0

## 2023-01-17 MED ORDER — VITAMIN D (ERGOCALCIFEROL) 1.25 MG (50000 UNIT) PO CAPS
50000.0000 [IU] | ORAL_CAPSULE | ORAL | 0 refills | Status: DC
Start: 2023-01-17 — End: 2023-02-16
  Filled 2023-01-17: qty 4, 28d supply, fill #0

## 2023-01-25 ENCOUNTER — Telehealth (INDEPENDENT_AMBULATORY_CARE_PROVIDER_SITE_OTHER): Payer: Commercial Managed Care - PPO | Admitting: Family Medicine

## 2023-01-25 ENCOUNTER — Other Ambulatory Visit (HOSPITAL_COMMUNITY): Payer: Self-pay

## 2023-01-25 ENCOUNTER — Encounter: Payer: Self-pay | Admitting: Family Medicine

## 2023-01-25 DIAGNOSIS — U071 COVID-19: Secondary | ICD-10-CM

## 2023-01-25 MED ORDER — NIRMATRELVIR/RITONAVIR (PAXLOVID)TABLET
3.0000 | ORAL_TABLET | Freq: Two times a day (BID) | ORAL | 0 refills | Status: AC
  Filled 2023-01-25: qty 30, 5d supply, fill #0

## 2023-01-25 NOTE — Progress Notes (Unsigned)
Interactive audio and video telecommunications were attempted between this provider and patient, however failed, due to patient having technical difficulties OR patient did not have access to video capability.  We continued and completed visit with audio only.   Virtual Visit via Telephone Note  I connected with patient on 01/25/23  at 3:20 PM  by telephone and verified that I am speaking with the correct person using two identifiers.  Location of patient: home   Location of MD: [facility] Name of referring provider (if blank then none associated): Names per persons and role in encounter:  MD: Ferd Hibbs, Patient: name listed above.  If vitals are not listed, then patient was unable to self-report due to a lack of equipment at home via telehealth   I discussed the limitations, risks, security and privacy concerns of performing an evaluation and management service by telephone and the availability of in person appointments. I also discussed with the patient that there may be a patient responsible charge related to this service. The patient expressed understanding and agreed to proceed.  CC:  History of Present Illness: son had covid, then the rest of the family caught it.  He has had covid prev.  Taste affected.  Sx started 2 days ago.  Fatigued.  Prev with fever, better now.  Not SOB.  No BLE edema.  No CP.  No wheeze.  No vomiting, no diarrhea.  Congested and voice is altered.  He has taken paxlovid in the past.      Observations/Objective: Sounds to be in Nad Speech wnl  Assessment and Plan: COVID.  Routine cautions given to patient.  Does not need emergent intervention or in person evaluation.  Rest and fluids.  Paxlovid cautions discussed with patient.  GFR greater than 100. Stop crestor while taking paxlovid.  Update [place] as needed.  He agrees to plan.  Follow Up Instructions: see above.     I discussed the assessment and treatment plan with the patient. The patient was  provided an opportunity to ask questions and all were answered. The patient agreed with the plan and demonstrated an understanding of the instructions.   The patient was advised to call back or seek an in-person evaluation if the symptoms worsen or if the condition fails to improve as anticipated.  I provided 11 minutes of non-face-to-face time during this encounter.  Crawford Givens, MD

## 2023-01-26 DIAGNOSIS — U071 COVID-19: Secondary | ICD-10-CM | POA: Insufficient documentation

## 2023-01-26 NOTE — Assessment & Plan Note (Signed)
Routine cautions given to patient.  Does not need emergent intervention or in person evaluation.  Rest and fluids.  Paxlovid cautions discussed with patient.  GFR greater than 100. Stop crestor while taking paxlovid.  Update [place] as needed.  He agrees to plan.

## 2023-02-15 ENCOUNTER — Telehealth (INDEPENDENT_AMBULATORY_CARE_PROVIDER_SITE_OTHER): Payer: Commercial Managed Care - PPO | Admitting: Family Medicine

## 2023-02-15 NOTE — Progress Notes (Signed)
TeleHealth Visit:  This visit was completed with telemedicine (audio/video) technology. Alejandro [name] has verbally consented to this TeleHealth visit. The patient is located at home, the provider is located at home. The participants in this visit include the listed provider and patient. The visit was conducted today via MyChart video.  OBESITY Alejandro [name] is here to discuss his progress with his obesity treatment plan along with follow-up of his obesity related diagnoses.   Today's visit was # 56  Starting weight: 254 lbs Starting date: 04/09/19 Weight at clinic on 12/20/22: 210 lbs Weight reported at last virtual office visit: 208 lbs on 01/17/23 Today's reported weight (02/16/23):  205 lbs  Total weight loss: 49 lbs Weight change since last visit: -3 lbs   Nutrition Plan: low carbohydrate plan    Current exercise:  not consistent  Interim History:  Goal is 198 lbs by late October (class reunion).  He started his new job with [employer] mid August.  He is hoping he has coverage for Zepbound. He has not been getting in groceries due to time factor recently. He has exercised some at his gym at work.  He has been off plan the last week.  Previously weight was down to 201 but currently it is 205. He has not been having breakfast. Water intake is fair.  Pharmacotherapy: Alejandro [name] is on Zepbound 10 mg SQ weekly. Adverse side effects: None Hunger is well controlled.  Assessment/Plan:  1. Hyperlipidemia LDL is not at goal.  Per cardiology LDL goal is 70.  Cardiology mentioned they may add Zetia if LDL is not improved at next check. Medication(s): Crestor 40 mg daily. Has history of non-STEMI (Takotsubo syndrome).  Lab Results  Component Value Date   CHOL 156 11/17/2022   HDL 53 11/17/2022   LDLCALC 80 11/17/2022   LDLDIRECT 224.0 08/26/2017   TRIG 133 11/17/2022   CHOLHDL 2.5 10/15/2021   CHOLHDL 2.9 09/15/2021   CHOLHDL 2.7 07/07/2021   Lab Results  Component Value Date   ALT 22  11/17/2022   AST 15 11/17/2022   ALKPHOS 58 11/17/2022   BILITOT 0.3 11/17/2022   The 10-year ASCVD risk score (Arnett DK, et al., 2019) is: 0.6%   Values used to calculate the score:     Age: 43 years     Sex: Male     Is Non-Hispanic African American: No     Diabetic: No     Tobacco smoker: No     Systolic Blood Pressure: 102 mmHg     Is BP treated: No     HDL Cholesterol: 53 mg/dL     Total Cholesterol: 156 mg/dL  Plan: Refill Crestor 40 mg daily. Establish an exercise routine. Continue to work on weight loss.  2. Vitamin D Deficiency Vitamin D is not at goal of 50.  Most recent vitamin D level was 40.[name] He is on  prescription ergocalciferol 50,000 IU weekly. Lab Results  Component Value Date   VD25OH 40.0 11/17/2022   VD25OH 35.3 04/21/2022   VD25OH 33.5 02/03/2021    Plan: Continue and refill  prescription ergocalciferol 50,000 IU weekly   3. Generalized Obesity: Current BMI 31  Pharmacotherapy Plan Continue and refill Zepbound 10 mg SQ weekly.   Alejandro [name] is currently in the action stage of change. As such, his goal is to continue with weight loss efforts.  He has agreed to low carbohydrate plan.  Exercise goals: He plans to get into a regular exercise routine now that he has started his new  job.  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  Alejandro [name] has agreed to follow-up with our clinic in 4 weeks.  No orders of the defined types were placed in this encounter.   Medications Discontinued During This Encounter  Medication Reason   rosuvastatin (CRESTOR) 40 MG tablet Reorder   Vitamin D, Ergocalciferol, (DRISDOL) 1.25 MG (50000 UNIT) CAPS capsule Reorder   tirzepatide (ZEPBOUND) 10 MG/0.5ML Pen Reorder     Meds ordered this encounter  Medications   tirzepatide (ZEPBOUND) 10 MG/0.5ML Pen    Sig: Inject 10 mg into the skin  once a week.    Dispense:  2 mL    Refill:  0    Order Specific Question:   Supervising Provider    Answer:   Glennis Brink [[id number]]   rosuvastatin (CRESTOR) 40 MG tablet    Sig: Take 1 tablet (40 mg total) by mouth daily.    Dispense:  90 tablet    Refill:  0    Order Specific Question:   Supervising Provider    Answer:   Seymour Bars E [[id number]]   Vitamin D, Ergocalciferol, (DRISDOL) 1.25 MG (50000 UNIT) CAPS capsule    Sig: Take 1 capsule (50,000 Units total) by mouth every 7 (seven) days.    Dispense:  12 capsule    Refill:  0    Order Specific Question:   Supervising Provider    Answer:   Glennis Brink [[id number]]      Objective:   VITALS: Per patient if applicable, see vitals. GENERAL: Alert and in no acute distress. CARDIOPULMONARY: No increased WOB. Speaking in clear sentences.  PSYCH: Pleasant and cooperative. Speech normal rate and rhythm. Affect is appropriate. Insight and judgement are appropriate. Attention is focused, linear, and appropriate.  NEURO: Oriented as arrived to appointment on time with no prompting.   Attestation Statements:   Reviewed by clinician on day of visit: allergies, medications, problem list, medical history, surgical history, family history, social history, and previous encounter notes.  This was prepared with the assistance of [employer].  Occasional wrong-word or sound-a-like substitutions may have occurred due to the inherent limitations of voice recognition software.

## 2023-02-16 ENCOUNTER — Encounter (INDEPENDENT_AMBULATORY_CARE_PROVIDER_SITE_OTHER): Payer: Self-pay | Admitting: Family Medicine

## 2023-02-16 ENCOUNTER — Other Ambulatory Visit (HOSPITAL_COMMUNITY): Payer: Self-pay

## 2023-02-16 ENCOUNTER — Telehealth (INDEPENDENT_AMBULATORY_CARE_PROVIDER_SITE_OTHER): Payer: Managed Care, Other (non HMO) | Admitting: Family Medicine

## 2023-02-16 VITALS — Ht 68.0 in | Wt 205.0 lb

## 2023-02-16 DIAGNOSIS — E785 Hyperlipidemia, unspecified: Secondary | ICD-10-CM | POA: Diagnosis not present

## 2023-02-16 DIAGNOSIS — Z6831 Body mass index (BMI) 31.0-31.9, adult: Secondary | ICD-10-CM

## 2023-02-16 DIAGNOSIS — E559 Vitamin D deficiency, unspecified: Secondary | ICD-10-CM | POA: Diagnosis not present

## 2023-02-16 DIAGNOSIS — E669 Obesity, unspecified: Secondary | ICD-10-CM

## 2023-02-16 DIAGNOSIS — E782 Mixed hyperlipidemia: Secondary | ICD-10-CM

## 2023-02-16 MED ORDER — ROSUVASTATIN CALCIUM 40 MG PO TABS
40.0000 mg | ORAL_TABLET | Freq: Every day | ORAL | 0 refills | Status: DC
Start: 2023-02-16 — End: 2023-06-23
  Filled 2023-02-16 – 2023-03-18 (×2): qty 30, 30d supply, fill #0

## 2023-02-16 MED ORDER — ZEPBOUND 10 MG/0.5ML ~~LOC~~ SOAJ
10.0000 mg | SUBCUTANEOUS | 0 refills | Status: DC
Start: 2023-02-16 — End: 2023-06-23
  Filled 2023-02-16 – 2023-04-25 (×2): qty 2, 28d supply, fill #0

## 2023-02-16 MED ORDER — VITAMIN D (ERGOCALCIFEROL) 1.25 MG (50000 UNIT) PO CAPS
50000.0000 [IU] | ORAL_CAPSULE | ORAL | 0 refills | Status: DC
Start: 2023-02-16 — End: 2023-06-23
  Filled 2023-02-16 – 2023-03-18 (×2): qty 4, 28d supply, fill #0

## 2023-02-21 ENCOUNTER — Other Ambulatory Visit (HOSPITAL_COMMUNITY): Payer: Self-pay

## 2023-03-01 ENCOUNTER — Other Ambulatory Visit (HOSPITAL_COMMUNITY): Payer: Self-pay

## 2023-03-02 ENCOUNTER — Telehealth (INDEPENDENT_AMBULATORY_CARE_PROVIDER_SITE_OTHER): Payer: Self-pay

## 2023-03-02 NOTE — Telephone Encounter (Signed)
See my chart message

## 2023-03-04 ENCOUNTER — Other Ambulatory Visit (HOSPITAL_COMMUNITY): Payer: Self-pay

## 2023-03-17 ENCOUNTER — Other Ambulatory Visit (HOSPITAL_COMMUNITY): Payer: Self-pay

## 2023-03-17 MED ORDER — ZEPBOUND 7.5 MG/0.5ML ~~LOC~~ SOAJ
7.5000 mg | SUBCUTANEOUS | 0 refills | Status: DC
  Filled 2023-03-17: qty 2, 28d supply, fill #0

## 2023-03-18 ENCOUNTER — Other Ambulatory Visit (HOSPITAL_COMMUNITY): Payer: Self-pay

## 2023-03-30 ENCOUNTER — Other Ambulatory Visit (HOSPITAL_COMMUNITY): Payer: Self-pay

## 2023-04-25 ENCOUNTER — Other Ambulatory Visit (HOSPITAL_COMMUNITY): Payer: Self-pay

## 2023-04-29 ENCOUNTER — Ambulatory Visit (INDEPENDENT_AMBULATORY_CARE_PROVIDER_SITE_OTHER): Payer: Managed Care, Other (non HMO) | Admitting: Family Medicine

## 2023-04-29 ENCOUNTER — Encounter: Payer: Self-pay | Admitting: Family Medicine

## 2023-04-29 VITALS — BP 108/80 | HR 72 | Temp 98.2°F | Ht 68.0 in | Wt 216.4 lb

## 2023-04-29 DIAGNOSIS — R051 Acute cough: Secondary | ICD-10-CM | POA: Diagnosis not present

## 2023-04-29 LAB — POC INFLUENZA A&B (BINAX/QUICKVUE)
Influenza A, POC: NEGATIVE
Influenza B, POC: NEGATIVE

## 2023-04-29 LAB — POC COVID19 BINAXNOW: SARS Coronavirus 2 Ag: NEGATIVE

## 2023-04-29 MED ORDER — AZITHROMYCIN 250 MG PO TABS: ORAL_TABLET | ORAL | 0 refills | Status: DC

## 2023-04-29 MED ORDER — GUAIFENESIN-CODEINE 100-10 MG/5ML PO SOLN: 5.0000 mL | Freq: Four times a day (QID) | ORAL | 0 refills | Status: DC | PRN

## 2023-04-29 NOTE — Assessment & Plan Note (Signed)
Acute, most likely viral upper respiratory tract infection, no current sign of bacterial superinfection. Negative COVID and flu test. Recommend rest and symptomatic care.  Prescription for cough suppressant sent to pharmacy to use at bedtime. If not improving as expected by day 7-10 of illness, he can fill prescription of azithromycin sent to pharmacy.  Return and ER precautions provided.

## 2023-04-29 NOTE — Addendum Note (Signed)
Addended by: Damita Lack on: 04/29/2023 09:38 AM   Modules accepted: Orders

## 2023-04-29 NOTE — Progress Notes (Signed)
Patient ID: Alejandro [name], male    DOB: [birth year], 42 y.o.   MRN: [id number]  This visit was conducted in person.  BP 108/80 (BP Location: Right Arm, Patient Position: Sitting, Cuff Size: Large)   Pulse 72   Temp 98.2 F (36.8 C) (Temporal)   Ht 5\' 8"  (1.727 m)   Wt 216 lb 6 oz (98.1 kg)   SpO2 98%   BMI 32.90 kg/m    CC:  Chief Complaint  Patient presents with   Cough    Symptoms started on Tuesday Negative Covid Test Yesterday   Sore Throat   Nasal Congestion    Subjective:   HPI: Alejandro [name] is a 42 y.o. male presenting on 04/29/2023 for Cough (Symptoms started on Tuesday/Negative Covid Test Yesterday), Sore Throat, and Nasal Congestion   Date of onset: 3 days Initial symptoms included  scaratchy sST Symptoms progressed to cough, productive  Has body aches and low grade temp    Sick contacts:  went to [place] COVID testing:   negative     She has tried to treat with tylenol     No history of chronic lung disease such as asthma or COPD. Non-smoker.  No recent antibiotics.     Relevant past medical, surgical, family and social history reviewed and updated as indicated. Interim medical history since our last visit reviewed. Allergies and medications reviewed and updated. Outpatient Medications Prior to Visit  Medication Sig Dispense Refill   ALPRAZolam (XANAX) 0.25 MG tablet Take 1 tablet (0.25 mg total) by mouth 2 (two) times daily as needed for anxiety. 20 tablet 0   cyclobenzaprine (FLEXERIL) 10 MG tablet Take 10 mg by mouth as needed.     rosuvastatin (CRESTOR) 40 MG tablet Take 1 tablet (40 mg total) by mouth daily. 90 tablet 0   Vitamin D, Ergocalciferol, (DRISDOL) 1.25 MG (50000 UNIT) CAPS capsule Take 1 capsule (50,000 Units total) by mouth every 7 (seven) days. 12 capsule 0   tirzepatide (ZEPBOUND) 10 MG/0.5ML Pen Inject 10 mg into the skin once a week. (Patient not taking: Reported on 04/29/2023) 2 mL 0   tirzepatide (ZEPBOUND) 7.5 MG/0.5ML Pen  Inject 7.5 mg into the skin once a week. (Patient not taking: Reported on 04/29/2023) 2 mL 0   No facility-administered medications prior to visit.     Per HPI unless specifically indicated in ROS section below Review of Systems  Constitutional:  Negative for fatigue and fever.  HENT:  Positive for congestion. Negative for ear pain.   Eyes:  Negative for pain.  Respiratory:  Positive for cough. Negative for shortness of breath.   Cardiovascular:  Negative for chest pain, palpitations and leg swelling.  Gastrointestinal:  Negative for abdominal pain.  Genitourinary:  Negative for dysuria.  Musculoskeletal:  Negative for arthralgias.  Neurological:  Negative for syncope, light-headedness and headaches.  Psychiatric/Behavioral:  Negative for dysphoric mood.    Objective:  BP 108/80 (BP Location: Right Arm, Patient Position: Sitting, Cuff Size: Large)   Pulse 72   Temp 98.2 F (36.8 C) (Temporal)   Ht 5\' 8"  (1.727 m)   Wt 216 lb 6 oz (98.1 kg)   SpO2 98%   BMI 32.90 kg/m   Wt Readings from Last 3 Encounters:  04/29/23 216 lb 6 oz (98.1 kg)  02/16/23 205 lb (93 kg)  01/17/23 208 lb (94.3 kg)      Physical Exam Constitutional:      General: He is not in acute  distress.    Appearance: Normal appearance. He is well-developed. He is not ill-appearing or toxic-appearing.  HENT:     Head: Normocephalic and atraumatic.     Right Ear: Hearing, tympanic membrane, ear canal and external ear normal. No tenderness. No foreign body. Tympanic membrane is not retracted or bulging.     Left Ear: Hearing, tympanic membrane, ear canal and external ear normal. No tenderness. No foreign body. Tympanic membrane is not retracted or bulging.     Nose: Nose normal. No mucosal edema or rhinorrhea.     Right Sinus: No maxillary sinus tenderness or frontal sinus tenderness.     Left Sinus: No maxillary sinus tenderness or frontal sinus tenderness.     Mouth/Throat:     Mouth: Oropharynx is clear and  moist and mucous membranes are normal.     Dentition: Normal dentition. No dental caries.     Pharynx: Uvula midline. No oropharyngeal exudate.     Tonsils: No tonsillar abscesses.  Eyes:     General: Lids are normal. Lids are everted, no foreign bodies appreciated.     Extraocular Movements: EOM normal.     Conjunctiva/sclera: Conjunctivae normal.     Pupils: Pupils are equal, round, and reactive to light.  Neck:     Thyroid: No thyroid mass or thyromegaly.     Vascular: No carotid bruit.     Trachea: Trachea and phonation normal.  Cardiovascular:     Rate and Rhythm: Normal rate and regular rhythm.     Pulses: Normal pulses.     Heart sounds: Normal heart sounds, S1 normal and S2 normal. No murmur heard.    No gallop.  Pulmonary:     Effort: Pulmonary effort is normal. No respiratory distress.     Breath sounds: Normal breath sounds. No wheezing, rhonchi or rales.  Abdominal:     General: Bowel sounds are normal.     Palpations: Abdomen is soft.     Tenderness: There is no abdominal tenderness. There is no CVA tenderness, guarding or rebound.     Hernia: No hernia is present.  Musculoskeletal:     Cervical back: Normal range of motion and neck supple.  Skin:    General: Skin is warm, dry and intact.     Findings: No rash.  Neurological:     Mental Status: He is alert.     Deep Tendon Reflexes: Reflexes are normal and symmetric.  Psychiatric:        Mood and Affect: Mood and affect normal.        Speech: Speech normal.        Behavior: Behavior normal.        Judgment: Judgment normal.       Results for orders placed or performed in visit on 11/03/22  CBC with Differential/Platelet  Result Value Ref Range   WBC 5.8 3.4 - 10.8 x10E3/uL   RBC 5.24 4.14 - 5.80 x10E6/uL   Hemoglobin 15.2 13.0 - 17.7 g/dL   Hematocrit 60.4 54.0 - 51.0 %   MCV 89 79 - 97 fL   MCH 29.0 26.6 - 33.0 pg   MCHC 32.6 31.5 - 35.7 g/dL   RDW 98.1 19.1 - 47.8 %   Platelets 369 150 - 450  x10E3/uL   Neutrophils 41 Not Estab. %   Lymphs 47 Not Estab. %   Monocytes 8 Not Estab. %   Eos 1 Not Estab. %   Basos 2 Not Estab. %   Neutrophils Absolute  2.4 1.4 - 7.0 x10E3/uL   Lymphocytes Absolute 2.7 0.7 - 3.1 x10E3/uL   Monocytes Absolute 0.5 0.1 - 0.9 x10E3/uL   EOS (ABSOLUTE) 0.1 0.0 - 0.4 x10E3/uL   Basophils Absolute 0.1 0.0 - 0.2 x10E3/uL   Immature Granulocytes 1 Not Estab. %   Immature Grans (Abs) 0.0 0.0 - 0.1 x10E3/uL  Comprehensive metabolic panel  Result Value Ref Range   Glucose 99 70 - 99 mg/dL   BUN 14 6 - 24 mg/dL   Creatinine, Ser 1.61 0.76 - 1.27 mg/dL   eGFR 096 >04 VW/UJW/1.19   BUN/Creatinine Ratio 15 9 - 20   Sodium 143 134 - 144 mmol/L   Potassium 4.4 3.5 - 5.2 mmol/L   Chloride 107 (H) 96 - 106 mmol/L   CO2 22 20 - 29 mmol/L   Calcium 9.1 8.7 - 10.2 mg/dL   Total Protein 6.6 6.0 - 8.5 g/dL   Albumin 4.4 4.1 - 5.1 g/dL   Globulin, Total 2.2 1.5 - 4.5 g/dL   Albumin/Globulin Ratio 2.0 1.2 - 2.2   Bilirubin Total 0.3 0.0 - 1.2 mg/dL   Alkaline Phosphatase 58 44 - 121 IU/L   AST 15 0 - 40 IU/L   ALT 22 0 - 44 IU/L  Hemoglobin A1c  Result Value Ref Range   Hgb A1c MFr Bld 5.8 (H) 4.8 - 5.6 %   Est. average glucose Bld gHb Est-mCnc 120 mg/dL  Insulin, random  Result Value Ref Range   INSULIN 30.3 (H) 2.6 - 24.9 uIU/mL  Lipid Panel With LDL/HDL Ratio  Result Value Ref Range   Cholesterol, Total 156 100 - 199 mg/dL   Triglycerides 147 0 - 149 mg/dL   HDL 53 >82 mg/dL   VLDL Cholesterol Cal 23 5 - 40 mg/dL   LDL Chol Calc (NIH) 80 0 - 99 mg/dL   LDL/HDL Ratio 1.5 0.0 - 3.6 ratio  TSH  Result Value Ref Range   TSH 1.180 0.450 - 4.500 uIU/mL  T4, free  Result Value Ref Range   Free T4 1.37 0.82 - 1.77 ng/dL  T3  Result Value Ref Range   T3, Total 128 71 - 180 ng/dL  VITAMIN D 25 Hydroxy (Vit-D Deficiency, Fractures)  Result Value Ref Range   Vit D, 25-Hydroxy 40.0 30.0 - 100.0 ng/mL    Assessment and Plan  Acute cough Assessment &  Plan: Acute, most likely viral upper respiratory tract infection, no current sign of bacterial superinfection. Negative COVID and flu test. Recommend rest and symptomatic care.  Prescription for cough suppressant sent to pharmacy to use at bedtime. If not improving as expected by day 7-10 of illness, he can fill prescription of azithromycin sent to pharmacy.  Return and ER precautions provided.   Other orders -     Azithromycin; 2 tab po x 1 day then 1 tab po daily  Dispense: 6 tablet; Refill: 0 -     guaiFENesin-Codeine; Take 5 mLs by mouth every 6 (six) hours as needed for cough.  Dispense: 100 mL; Refill: 0    No follow-ups on file.   Kerby Nora, MD

## 2023-06-23 ENCOUNTER — Ambulatory Visit (INDEPENDENT_AMBULATORY_CARE_PROVIDER_SITE_OTHER): Payer: Managed Care, Other (non HMO) | Admitting: Family Medicine

## 2023-06-23 ENCOUNTER — Other Ambulatory Visit (HOSPITAL_COMMUNITY): Payer: Self-pay

## 2023-06-23 ENCOUNTER — Encounter (INDEPENDENT_AMBULATORY_CARE_PROVIDER_SITE_OTHER): Payer: Self-pay | Admitting: Family Medicine

## 2023-06-23 VITALS — BP 121/85 | HR 82 | Temp 97.9°F | Ht 67.5 in | Wt 218.0 lb

## 2023-06-23 DIAGNOSIS — E669 Obesity, unspecified: Secondary | ICD-10-CM

## 2023-06-23 DIAGNOSIS — Z6833 Body mass index (BMI) 33.0-33.9, adult: Secondary | ICD-10-CM

## 2023-06-23 DIAGNOSIS — E785 Hyperlipidemia, unspecified: Secondary | ICD-10-CM

## 2023-06-23 DIAGNOSIS — E559 Vitamin D deficiency, unspecified: Secondary | ICD-10-CM | POA: Diagnosis not present

## 2023-06-23 DIAGNOSIS — E782 Mixed hyperlipidemia: Secondary | ICD-10-CM

## 2023-06-23 DIAGNOSIS — R632 Polyphagia: Secondary | ICD-10-CM | POA: Diagnosis not present

## 2023-06-23 MED ORDER — ROSUVASTATIN CALCIUM 40 MG PO TABS
40.0000 mg | ORAL_TABLET | Freq: Every day | ORAL | 0 refills | Status: DC
  Filled 2023-06-23: qty 30, 30d supply, fill #0

## 2023-06-23 MED ORDER — ZEPBOUND 7.5 MG/0.5ML ~~LOC~~ SOAJ
7.5000 mg | SUBCUTANEOUS | 0 refills | Status: DC
  Filled 2023-06-23: qty 2, 28d supply, fill #0

## 2023-06-23 MED ORDER — VITAMIN D (ERGOCALCIFEROL) 1.25 MG (50000 UNIT) PO CAPS
50000.0000 [IU] | ORAL_CAPSULE | ORAL | 0 refills | Status: DC
  Filled 2023-06-23: qty 4, 28d supply, fill #0

## 2023-06-23 NOTE — Progress Notes (Addendum)
 .smr  Office: [phone]  /  Fax: [phone]  WEIGHT SUMMARY AND BIOMETRICS  Anthropometric Measurements Height: 5' 7.5 (1.715 m) Weight: 218 lb (98.9 kg) BMI (Calculated): 33.62 Weight at Last Visit: 210 lb (Weighed in clinic on 12/22/2022) Weight Lost Since Last Visit: 0 Weight Gained Since Last Visit: 8 Starting Weight: 254 lb Total Weight Loss (lbs): 36 lb (16.3 kg)   Body Composition  Body Fat %: 25.7 % Fat Mass (lbs): 56 lbs Muscle Mass (lbs): 154 lbs Total Body Water (lbs): 112 lbs Visceral Fat Rating : 13   Other Clinical Data Fasting: No Labs: No Today's Visit #: 72 Starting Date: 04/09/19    Chief Complaint: OBESITY  History of Present Illness   The patient, with a history of obesity and vitamin D  deficiency, has gained eight pounds since his last visit in July of the previous year. He reports not following any structured eating plan and not engaging in regular exercise. Despite being on prescription vitamin D , his last check in June showed levels had improved to forty but were still not at goal.  The patient recently started a new job, which he describes as busy and often not allowing for lunch breaks. He acknowledges that significant life changes often lead to neglect of his dietary habits. He reports struggling with overeating during the holiday season, particularly due to the abundance of food provided by his mother.  He has previously seen a therapist who suggested that his mother shows love through food, which he agrees with. 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  The patient expresses a preference for a low-carb, high-protein diet, acknowledging that it is hard to sustain long-term. He also mentions challenges with planning and cooking due to his new job. He has previously been on cholesterol-lowering medication,  Crestor , and requests a refill.  The patient's insurance coverage for Zepbound  has been problematic, with the medication costing five hundred dollars even when covered. He expresses a preference for using the [facility] on [address], which uses a coupon to reduce the cost. He expresses concerns about new companies offering cheaper versions of medications, citing worries about the lack of FDA regulation and potential risks.          PHYSICAL EXAM:  Blood pressure 121/85, pulse 82, temperature 97.9 F (36.6 C), height 5' 7.5 (1.715 m), weight 218 lb (98.9 kg), SpO2 97%. Body mass index is 33.64 kg/m.  DIAGNOSTIC DATA REVIEWED:  BMET    Component Value Date/Time   NA 143 11/17/2022 0737   K 4.4 11/17/2022 0737   CL 107 (H) 11/17/2022 0737   CO2 22 11/17/2022 0737   GLUCOSE 99 11/17/2022 0737   GLUCOSE 95 07/07/2021 1048   BUN 14 11/17/2022 0737   CREATININE 0.92 11/17/2022 0737   CALCIUM  9.1 11/17/2022 0737   GFRNONAA >60 07/07/2021 1048   GFRAA 112 06/04/2020 1155   Lab Results  Component Value Date   HGBA1C 5.8 (H) 11/17/2022   HGBA1C 5.7 (H) 04/09/2019   Lab Results  Component Value Date   INSULIN  30.3 (H) 11/17/2022   INSULIN  26.6 (H) 04/09/2019   Lab Results  Component Value Date   TSH 1.180 11/17/2022   CBC    Component Value Date/Time   WBC 5.8 11/17/2022 0737   WBC 6.4 07/07/2021 1048   RBC 5.24 11/17/2022  0737   RBC 5.73 07/07/2021 1048   HGB 15.2 11/17/2022 0737   HCT 46.6 11/17/2022 0737   PLT 369 11/17/2022 0737   MCV 89 11/17/2022 0737   MCH 29.0 11/17/2022 0737   MCH 29.7 07/07/2021 1048   MCHC 32.6 11/17/2022 0737   MCHC 34.3 07/07/2021 1048   RDW 13.0 11/17/2022 0737   Iron Studies No results found for: IRON, TIBC, FERRITIN, IRONPCTSAT Lipid Panel     Component Value Date/Time   CHOL 156 11/17/2022 0737   TRIG 133 11/17/2022 0737   HDL 53 11/17/2022 0737   CHOLHDL 2.5 10/15/2021 1134   VLDL 14 10/15/2021  1134   LDLCALC 80 11/17/2022 0737   LDLDIRECT 224.0 08/26/2017 1449   Hepatic Function Panel     Component Value Date/Time   PROT 6.6 11/17/2022 0737   ALBUMIN 4.4 11/17/2022 0737   AST 15 11/17/2022 0737   ALT 22 11/17/2022 0737   ALKPHOS 58 11/17/2022 0737   BILITOT 0.3 11/17/2022 0737   BILIDIR 0.1 10/15/2021 1134   IBILI 0.4 10/15/2021 1134      Component Value Date/Time   TSH 1.180 11/17/2022 0737   Nutritional Lab Results  Component Value Date   VD25OH 40.0 11/17/2022   VD25OH 35.3 04/21/2022   VD25OH 33.5 02/03/2021     Assessment and Plan    Obesity with polyphagia Gained eight pounds since July. Not following a structured eating plan or exercising. Discussed challenges with diet and exercise due to a busy job and holidays. Finds low-carb, high-protein diets effective but hard to sustain. Intermittently using Zepbound  (semaglutide ) 10 mg, helpful but off for three weeks. Discussed risks of non-FDA regulated compounded medications. - Recommend strict low-carb diet for two weeks to manage carb withdrawal. - Restart Zepbound  at 7.5 mg. - Send prescription for Zepbound  to [facility]. - Provide handouts on low-carb diet options, including approved foods and recipes.  Hyperlipidemia On Crestor  for cholesterol management. No recent lipid panel results discussed. - Refill prescription for Crestor .  Vitamin D  Deficiency Last vitamin D  level in June was 40, improved but not at goal. Likely decreased due to short winter days. - Refill prescription for vitamin D .  General Health Maintenance Discussed importance of regular follow-up and lab work to monitor health conditions. - Schedule fasting labs for next visit. - Follow up in four weeks to reassess weight management and medication efficacy.        He was informed of the importance of frequent follow up visits to maximize his success with intensive lifestyle modifications for his multiple health  conditions.    Louann Penton, MD

## 2023-06-24 ENCOUNTER — Other Ambulatory Visit (HOSPITAL_COMMUNITY): Payer: Self-pay

## 2023-06-28 ENCOUNTER — Ambulatory Visit (INDEPENDENT_AMBULATORY_CARE_PROVIDER_SITE_OTHER): Payer: Managed Care, Other (non HMO) | Admitting: Family Medicine

## 2023-07-08 ENCOUNTER — Ambulatory Visit: Payer: Self-pay

## 2023-07-08 ENCOUNTER — Ambulatory Visit: Payer: Self-pay | Admitting: Family Medicine

## 2023-07-08 NOTE — Telephone Encounter (Signed)
  Chief Complaint: headache  Symptoms: chronic headache Frequency: ongoing 1-2 years, 3-4x a week Pertinent Negatives: Patient denies N/V, numbness, fever, sinus pain Disposition: [] ED /[x] Urgent Care (no appt availability in office) / [] Appointment(In office/virtual)/ []  [facility]/ [] Home Care/ [] Refused Recommended Disposition /[] [facility]/ []  Follow-up with PCP Additional Notes: Patient calling with severe headaches lasting a couple hours a day, 3-4x a week for the past 1-2 years. Reports localized to R base of head interfering with work. Denies unilateral numbness, N/V, fever, sinus issues. Reports that OTC does not alleviate H/A. Scheduled patient at [facility] per protocol on 07/08/23. Patient verbalized understanding and to call 911 with worsening symptoms.     Copied from CRM [id number]. Topic: Clinical - Red Word Triage >> Jul 08, 2023  1:58 PM Fuller Mandril wrote: Red Word that prompted transfer to Nurse Triage: Worsening Headaches getting unbearable Reason for Disposition  [1] MODERATE headache (e.g., interferes with normal activities) AND [2] present > 24 hours AND [3] unexplained  (Exceptions: analgesics not tried, typical migraine, or headache part of viral illness)  Answer Assessment - Initial Assessment Questions 1. LOCATION: "Where does it hurt?"      Localized to back right of head, at base of neck, a couple of inches from ear Site sensitive to touch 2. ONSET: "When did the headache start?" (Minutes, hours or days)      1-2 years 3. PATTERN: "Does the pain come and go, or has it been constant since it started?"     It doesn't last all day-- lasts a couple hours  4. SEVERITY: "How bad is the pain?" and "What does it keep you from doing?"  (e.g., Scale 1-10; mild, moderate, or severe)   - MILD (1-3): doesn't interfere with normal activities    - MODERATE (4-7): interferes with normal activities or awakens from sleep    - SEVERE (8-10): excruciating pain,  unable to do any normal activities        6/10 throbbing pain Exacerbated with activity, happening a lot at work 5. RECURRENT SYMPTOM: "Have you ever had headaches before?" If Yes, ask: "When was the last time?" and "What happened that time?"      3-4 x a week if not more 6. CAUSE: "What do you think is causing the headache?"     I don't know Reports wears reading glasses 7. MIGRAINE: "Have you been diagnosed with migraine headaches?" If Yes, ask: "Is this headache similar?"      unknown 8. HEAD INJURY: "Has there been any recent injury to the head?"      no 9. OTHER SYMPTOMS: "Do you have any other symptoms?" (fever, stiff neck, eye pain, sore throat, cold symptoms)     Stiff/sensitive neck Tylenol with no relief  Protocols used: Headache-A-AH

## 2023-07-08 NOTE — Telephone Encounter (Signed)
Agree with UC eval.  I am not in clinic today.  Thanks.

## 2023-07-21 ENCOUNTER — Ambulatory Visit (INDEPENDENT_AMBULATORY_CARE_PROVIDER_SITE_OTHER): Payer: Managed Care, Other (non HMO) | Admitting: Family Medicine

## 2023-07-27 ENCOUNTER — Ambulatory Visit (INDEPENDENT_AMBULATORY_CARE_PROVIDER_SITE_OTHER): Payer: Managed Care, Other (non HMO) | Admitting: Adult Health

## 2023-07-27 ENCOUNTER — Encounter (INDEPENDENT_AMBULATORY_CARE_PROVIDER_SITE_OTHER): Payer: Self-pay | Admitting: Adult Health

## 2023-07-27 ENCOUNTER — Other Ambulatory Visit (HOSPITAL_COMMUNITY): Payer: Self-pay

## 2023-07-27 VITALS — BP 129/87 | HR 81 | Temp 97.6°F | Ht 67.5 in | Wt 215.0 lb

## 2023-07-27 DIAGNOSIS — R632 Polyphagia: Secondary | ICD-10-CM

## 2023-07-27 DIAGNOSIS — E782 Mixed hyperlipidemia: Secondary | ICD-10-CM | POA: Diagnosis not present

## 2023-07-27 DIAGNOSIS — Z6833 Body mass index (BMI) 33.0-33.9, adult: Secondary | ICD-10-CM

## 2023-07-27 DIAGNOSIS — E559 Vitamin D deficiency, unspecified: Secondary | ICD-10-CM

## 2023-07-27 DIAGNOSIS — R7303 Prediabetes: Secondary | ICD-10-CM

## 2023-07-27 DIAGNOSIS — E669 Obesity, unspecified: Secondary | ICD-10-CM

## 2023-07-27 MED ORDER — ZEPBOUND 7.5 MG/0.5ML ~~LOC~~ SOAJ
7.5000 mg | SUBCUTANEOUS | 0 refills | Status: DC
  Filled 2023-07-27 – 2023-08-12 (×2): qty 2, 28d supply, fill #0

## 2023-07-27 NOTE — Progress Notes (Signed)
WEIGHT SUMMARY AND BIOMETRICS  Vitals Temp: 97.6 F (36.4 C) BP: 129/87 Pulse Rate: 81 SpO2: 96 %   Anthropometric Measurements Height: 5' 7.5" (1.715 m) Weight: 215 lb (97.5 kg) BMI (Calculated): 33.16 Weight at Last Visit: 218lb Weight Lost Since Last Visit: 3lb Weight Gained Since Last Visit: 0lb Starting Weight: 254lb Total Weight Loss (lbs): 39 lb (17.7 kg)   Body Composition  Body Fat %: 25 % Fat Mass (lbs): 53.8 lbs Muscle Mass (lbs): 153.2 lbs Total Body Water (lbs): 107.4 lbs Visceral Fat Rating : 12   Other Clinical Data Fasting: Yes Labs: Yes Today's Visit #: 31 Starting Date: 04/09/19    Chief Complaint:   OBESITY Alejandro [name] is here to discuss his progress with his obesity treatment plan.  He is on the following a lower carbohydrate, vegetable and lean protein rich diet plan and states he is following his eating plan approximately 40 % of the time.  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   Interim History:  He is still settling into a new job and is working [phone] He is limited with options for exercise, however has found office can provide him at least 30 mins of strength training per week.  He restarted weekly Zepbound 7.5mg  on/about 06/23/2023- tolerating well with exception of mild constipation. He restarted GLP-1/GIP therapy after a 3-4 week hiatus from injections.  Hydration-he is trying to increase daily plain water intake  Subjective:   1. Vitamin D deficiency  Latest Reference Range & Units 11/17/22 07:37  Vitamin D, 25-Hydroxy 30.0 - 100.0 ng/mL 40.0   He is on weekly Ergocalciferol-denies N/V/Muscle Weakness  2. Polyphagia He restarted weekly Zepbound 7.5mg  on/about 06/23/2023 Denies mass in neck, dysphagia, dyspepsia, persistent hoarseness, abdominal pain, or N/V He endorses constipation He denies hematochezia  3. Prediabetes Lab Results  Component Value Date   HGBA1C 5.8 (H)  11/17/2022   HGBA1C 5.7 (H) 04/21/2022   HGBA1C 5.2 07/07/2021   He restarted weekly Zepbound 7.5mg  on/about 06/23/2023 Denies mass in neck, dysphagia, dyspepsia, persistent hoarseness, abdominal pain, or N/V He endorses constipation He denies hematochezia  4. Mixed hyperlipidemia Lipid Panel     Component Value Date/Time   CHOL 156 11/17/2022 0737   TRIG 133 11/17/2022 0737   HDL 53 11/17/2022 0737   CHOLHDL 2.5 10/15/2021 1134   VLDL 14 10/15/2021 1134   LDLCALC 80 11/17/2022 0737   LDLDIRECT 224.0 08/26/2017 1449   LABVLDL 23 11/17/2022 0737   The ASCVD Risk score (Arnett DK, et al., 2019) failed to calculate for the following reasons:   Risk score cannot be calculated because patient has a medical history suggesting prior/existing ASCVD    He is on daily Crestor 40mg  EPIC review, statin was managed by PCP, Heart Failure Team, and most recently [facility] He has hx of Takotsubo Sundrome  Assessment/Plan:   1. Vitamin D deficiency (Primary) Continue weekly Ergocalciferol- denies need for refill today  2. Polyphagia Refill - tirzepatide (ZEPBOUND) 7.5 MG/0.5ML Pen; Inject 7.5 mg into the skin once a week.  Dispense: 2 mL; Refill: 0  Attempt insurance coverage under OSA and Heart Disease in future  3. Prediabetes Limit sugar/simple CHO Increase protein and regular exercise  4. Mixed hyperlipidemia Limit Sat Fat Continue daily statin  5. BMI 33.0-33.9,adult, Currewnt BMI 33.2  Alejandro [name] is currently in the action stage of change. As such, his goal is to continue with weight loss efforts. He has  agreed to following a lower carbohydrate, vegetable and lean protein rich diet plan.   Exercise goals: For substantial health benefits, adults should do at least 150 minutes (2 hours and 30 minutes) a week of moderate-intensity, or 75 minutes (1 hour and 15 minutes) a week of vigorous-intensity aerobic physical activity, or an equivalent combination of moderate- and vigorous-intensity  aerobic activity. Aerobic activity should be performed in episodes of at least 10 minutes, and preferably, it should be spread throughout the week.  Behavioral modification strategies: increasing lean protein intake, decreasing simple carbohydrates, increasing vegetables, increasing water intake, meal planning and cooking strategies, keeping healthy foods in the home, ways to avoid boredom eating, and planning for success.  Alejandro [name] has agreed to follow-up with our clinic in 4 weeks. He was informed of the importance of frequent follow-up visits to maximize his success with intensive lifestyle modifications for his multiple health conditions.   Check Fasting Labs at next OV  Objective:   Blood pressure 129/87, pulse 81, temperature 97.6 F (36.4 C), height 5' 7.5" (1.715 m), weight 215 lb (97.5 kg), SpO2 96%. Body mass index is 33.18 kg/m.  General: Cooperative, alert, well developed, in no acute distress. HEENT: Conjunctivae and lids unremarkable. Cardiovascular: Regular rhythm.  Lungs: Normal work of breathing. Neurologic: No focal deficits.   Lab Results  Component Value Date   CREATININE 0.92 11/17/2022   BUN 14 11/17/2022   NA 143 11/17/2022   K 4.4 11/17/2022   CL 107 (H) 11/17/2022   CO2 22 11/17/2022   Lab Results  Component Value Date   ALT 22 11/17/2022   AST 15 11/17/2022   ALKPHOS 58 11/17/2022   BILITOT 0.3 11/17/2022   Lab Results  Component Value Date   HGBA1C 5.8 (H) 11/17/2022   HGBA1C 5.7 (H) 04/21/2022   HGBA1C 5.2 07/07/2021   HGBA1C 5.8 (H) 02/03/2021   HGBA1C 5.3 06/04/2020   Lab Results  Component Value Date   INSULIN 30.3 (H) 11/17/2022   INSULIN 14.6 04/21/2022   INSULIN 25.5 (H) 02/03/2021   INSULIN 14.2 06/04/2020   INSULIN 7.2 12/10/2019   Lab Results  Component Value Date   TSH 1.180 11/17/2022   Lab Results  Component Value Date   CHOL 156 11/17/2022   HDL 53 11/17/2022   LDLCALC 80 11/17/2022   LDLDIRECT 224.0 08/26/2017    TRIG 133 11/17/2022   CHOLHDL 2.5 10/15/2021   Lab Results  Component Value Date   VD25OH 40.0 11/17/2022   VD25OH 35.3 04/21/2022   VD25OH 33.5 02/03/2021   Lab Results  Component Value Date   WBC 5.8 11/17/2022   HGB 15.2 11/17/2022   HCT 46.6 11/17/2022   MCV 89 11/17/2022   PLT 369 11/17/2022   No results found for: "IRON", "TIBC", "FERRITIN"  Attestation Statements:   Reviewed by clinician on day of visit: allergies, medications, problem list, medical history, surgical history, family history, social history, and previous encounter notes.  I have reviewed the above documentation for accuracy and completeness, and I agree with the above. -  Alejandro [name] d. Madia Carvell, NP-C

## 2023-08-08 ENCOUNTER — Other Ambulatory Visit (HOSPITAL_COMMUNITY): Payer: Self-pay

## 2023-08-12 ENCOUNTER — Other Ambulatory Visit (HOSPITAL_COMMUNITY): Payer: Self-pay

## 2023-09-01 ENCOUNTER — Encounter (INDEPENDENT_AMBULATORY_CARE_PROVIDER_SITE_OTHER): Payer: Self-pay | Admitting: Adult Health

## 2023-09-01 ENCOUNTER — Other Ambulatory Visit (HOSPITAL_COMMUNITY): Payer: Self-pay

## 2023-09-01 ENCOUNTER — Ambulatory Visit (INDEPENDENT_AMBULATORY_CARE_PROVIDER_SITE_OTHER): Payer: Managed Care, Other (non HMO) | Admitting: Adult Health

## 2023-09-01 VITALS — BP 114/76 | HR 78 | Temp 97.4°F | Ht 67.5 in | Wt 213.0 lb

## 2023-09-01 DIAGNOSIS — E782 Mixed hyperlipidemia: Secondary | ICD-10-CM

## 2023-09-01 DIAGNOSIS — E66812 Obesity, class 2: Secondary | ICD-10-CM

## 2023-09-01 DIAGNOSIS — E559 Vitamin D deficiency, unspecified: Secondary | ICD-10-CM

## 2023-09-01 DIAGNOSIS — R632 Polyphagia: Secondary | ICD-10-CM

## 2023-09-01 DIAGNOSIS — R7303 Prediabetes: Secondary | ICD-10-CM

## 2023-09-01 DIAGNOSIS — E669 Obesity, unspecified: Secondary | ICD-10-CM

## 2023-09-01 DIAGNOSIS — Z6833 Body mass index (BMI) 33.0-33.9, adult: Secondary | ICD-10-CM

## 2023-09-01 MED ORDER — VITAMIN D (ERGOCALCIFEROL) 1.25 MG (50000 UNIT) PO CAPS
50000.0000 [IU] | ORAL_CAPSULE | ORAL | 0 refills | Status: DC
  Filled 2023-09-01 – 2023-09-14 (×2): qty 4, 28d supply, fill #0

## 2023-09-01 MED ORDER — TIRZEPATIDE-WEIGHT MANAGEMENT 10 MG/0.5ML ~~LOC~~ SOAJ
10.0000 mg | SUBCUTANEOUS | 0 refills | Status: DC
  Filled 2023-09-01 – 2023-09-14 (×2): qty 2, 28d supply, fill #0

## 2023-09-01 MED ORDER — ROSUVASTATIN CALCIUM 40 MG PO TABS
40.0000 mg | ORAL_TABLET | Freq: Every day | ORAL | 0 refills | Status: DC
  Filled 2023-09-01 – 2023-09-14 (×2): qty 30, 30d supply, fill #0

## 2023-09-01 NOTE — Progress Notes (Signed)
 WEIGHT SUMMARY AND BIOMETRICS  Vitals Temp: (!) 97.4 F (36.3 C) BP: 114/76 Pulse Rate: 78 SpO2: 97 %   Anthropometric Measurements Height: 5' 7.5" (1.715 m) Weight: 213 lb (96.6 kg) BMI (Calculated): 32.85 Weight at Last Visit: 215 lb Weight Lost Since Last Visit: 2 lb Weight Gained Since Last Visit: 0 lb Starting Weight: 254 lb Total Weight Loss (lbs): 41 lb (18.6 kg)   Body Composition  Body Fat %: 23.7 % Fat Mass (lbs): 50.6 lbs Muscle Mass (lbs): 155 lbs Total Body Water (lbs): 107 lbs Visceral Fat Rating : 12   Other Clinical Data Fasting: Yes Labs: Yes Today's Visit #: 4 Starting Date: 04/09/19    Chief Complaint:   OBESITY Alejandro [name] is here to discuss his progress with his obesity treatment plan.  He is on the following a lower carbohydrate, vegetable and lean protein rich diet plan and states he is following his eating plan approximately 50 % of the time.  He states he is exercising Strength Training 20 minutes 2 times per week.   Interim History:  Reviewed Bioimpedance results with pt: Muscle Mass: +1.8 lbs Adipose Mass: -3.2 lbs  He restarted weekly Zepbound 7.5mg  on/about 06/23/2023- tolerating well with exception of mild constipation. He restarted GLP-1/GIP therapy after a 3-4 week hiatus from injections. He injects on Friday and estimates his hunger is controlled 80% of the time.  Exercise-Strength Training 20 twice weekly.  Subjective:   1. Polyphagia He restarted weekly Zepbound 7.5mg  on/about 06/23/2023- tolerating well with exception of mild constipation. He restarted GLP-1/GIP therapy after a 3-4 week hiatus from injections. He injects on Friday and estimates his hunger is controlled 80% of the time. Denies mass in neck, dysphagia, dyspepsia, persistent hoarseness, abdominal pain, or N/V/Worsening Constipation  Discussed risks/benefits of increasing Zebpound from 7.5mg  to 10mg - he is agreeable to increase dose strength  Monthly  cost for Zepbound 10mg  pen is $350-most cost effective Monthly cost for Zepbounf 10mg   vials is $699  2. Mixed hyperlipidemia He is on daily Crestor 40mg - he denies myalgias He denies tobacco/vape use  3. Vitamin D deficiency He is still adjusting to his new job position, hours are quite demanding. He and his family will travel to the beach for 3 day trip during Spring Break next month. He is on weekly Ergocalciferol- denies N/V/Muscle Weakness  4. Prediabetes Lab Results  Component Value Date   HGBA1C 5.8 (H) 11/17/2022   HGBA1C 5.7 (H) 04/21/2022   HGBA1C 5.2 07/07/2021    He is limiting sugar/simple CHO intake He is still adjusting to his new job role and finding adequate time to exercise. He is on weekly Zepbound 7.5mg - tolerating well.  Assessment/Plan:   1. Polyphagia Refill and INCREASE  tirzepatide (ZEPBOUND) 10 MG/0.5ML Pen Inject 10 mg into the skin once a week. Dispense: 2 mL, Refills: 0 of 0 remaining   2. Mixed hyperlipidemia Check Labs - Comprehensive metabolic panel - Lipid panel - rosuvastatin (CRESTOR) 40 MG tablet; Take 1 tablet (40 mg total) by mouth daily.  Dispense: 30 tablet; Refill: 0  3. Vitamin D deficiency Check Labs and Refill - VITAMIN D 25 Hydroxy (Vit-D Deficiency, Fractures) - Vitamin D, Ergocalciferol, (DRISDOL) 1.25 MG (50000 UNIT) CAPS capsule; Take 1 capsule (50,000 Units total) by mouth every 7 (seven) days.  Dispense: 5 capsule; Refill: 0  4. Prediabetes (Primary) Check Labs - Hemoglobin A1c - Insulin, random - Vitamin B12  5. BMI 33.0-33.9,adult, Currewnt BMI 33.0 Refill and INCREASE  tirzepatide (ZEPBOUND) 10 MG/0.5ML Pen Inject 10 mg into the skin once a week. Dispense: 2 mL, Refills: 0 of 0 remaining   Alejandro [name] is currently in the action stage of change. As such, his goal is to continue with weight loss efforts. He has agreed to the Category 4 Plan.   Exercise goals: All adults should avoid inactivity. Some physical  activity is better than none, and adults who participate in any amount of physical activity gain some health benefits. Adults should also include muscle-strengthening activities that involve all major muscle groups on 2 or more days a week. Exercise prior to work at least once per week, try each Thursday  Behavioral modification strategies: increasing lean protein intake, decreasing simple carbohydrates, increasing vegetables, increasing water intake, no skipping meals, meal planning and cooking strategies, keeping healthy foods in the home, ways to avoid boredom eating, and planning for success.  Alejandro [name] has agreed to follow-up with our clinic in 4 weeks. He was informed of the importance of frequent follow-up visits to maximize his success with intensive lifestyle modifications for his multiple health conditions.   Alejandro [name] was informed we would discuss his lab results at his next visit unless there is a critical issue that needs to be addressed sooner. Alejandro [name] agreed to keep his next visit at the agreed upon time to discuss these results.  Objective:   Blood pressure 114/76, pulse 78, temperature (!) 97.4 F (36.3 C), height 5' 7.5" (1.715 m), weight 213 lb (96.6 kg), SpO2 97%. Body mass index is 32.87 kg/m.  General: Cooperative, alert, well developed, in no acute distress. HEENT: Conjunctivae and lids unremarkable. Cardiovascular: Regular rhythm.  Lungs: Normal work of breathing. Neurologic: No focal deficits.   Lab Results  Component Value Date   CREATININE 0.92 11/17/2022   BUN 14 11/17/2022   NA 143 11/17/2022   K 4.4 11/17/2022   CL 107 (H) 11/17/2022   CO2 22 11/17/2022   Lab Results  Component Value Date   ALT 22 11/17/2022   AST 15 11/17/2022   ALKPHOS 58 11/17/2022   BILITOT 0.3 11/17/2022   Lab Results  Component Value Date   HGBA1C 5.8 (H) 11/17/2022   HGBA1C 5.7 (H) 04/21/2022   HGBA1C 5.2 07/07/2021   HGBA1C 5.8 (H) 02/03/2021   HGBA1C 5.3 06/04/2020   Lab  Results  Component Value Date   INSULIN 30.3 (H) 11/17/2022   INSULIN 14.6 04/21/2022   INSULIN 25.5 (H) 02/03/2021   INSULIN 14.2 06/04/2020   INSULIN 7.2 12/10/2019   Lab Results  Component Value Date   TSH 1.180 11/17/2022   Lab Results  Component Value Date   CHOL 156 11/17/2022   HDL 53 11/17/2022   LDLCALC 80 11/17/2022   LDLDIRECT 224.0 08/26/2017   TRIG 133 11/17/2022   CHOLHDL 2.5 10/15/2021   Lab Results  Component Value Date   VD25OH 40.0 11/17/2022   VD25OH 35.3 04/21/2022   VD25OH 33.5 02/03/2021   Lab Results  Component Value Date   WBC 5.8 11/17/2022   HGB 15.2 11/17/2022   HCT 46.6 11/17/2022   MCV 89 11/17/2022   PLT 369 11/17/2022   No results found for: "IRON", "TIBC", "FERRITIN"  Attestation Statements:   Reviewed by clinician on day of visit: allergies, medications, problem list, medical history, surgical history, family history, social history, and previous encounter notes.  I have reviewed the above documentation for accuracy and completeness, and I agree with the above. -  Pradyun Ishman d. Jakiera Ehler, NP-C

## 2023-09-02 LAB — VITAMIN B12: Vitamin B-12: 369 pg/mL (ref 232–1245)

## 2023-09-02 LAB — HEMOGLOBIN A1C
Est. average glucose Bld gHb Est-mCnc: 108 mg/dL
Hgb A1c MFr Bld: 5.4 % (ref 4.8–5.6)

## 2023-09-02 LAB — LIPID PANEL
Chol/HDL Ratio: 4.7 ratio (ref 0.0–5.0)
Cholesterol, Total: 260 mg/dL — ABNORMAL HIGH (ref 100–199)
HDL: 55 mg/dL (ref >39)
LDL Chol Calc (NIH): 162 mg/dL — ABNORMAL HIGH (ref 0–99)
Triglycerides: 233 mg/dL — ABNORMAL HIGH (ref 0–149)
VLDL Cholesterol Cal: 43 mg/dL — ABNORMAL HIGH (ref 5–40)

## 2023-09-02 LAB — COMPREHENSIVE METABOLIC PANEL
ALT: 25 IU/L (ref 0–44)
AST: 21 IU/L (ref 0–40)
Albumin: 4.7 g/dL (ref 4.1–5.1)
Alkaline Phosphatase: 63 IU/L (ref 44–121)
BUN/Creatinine Ratio: 14 (ref 9–20)
BUN: 14 mg/dL (ref 6–24)
Bilirubin Total: 0.4 mg/dL (ref 0.0–1.2)
CO2: 19 mmol/L — ABNORMAL LOW (ref 20–29)
Calcium: 9.7 mg/dL (ref 8.7–10.2)
Chloride: 103 mmol/L (ref 96–106)
Creatinine, Ser: 0.97 mg/dL (ref 0.76–1.27)
Globulin, Total: 2.4 g/dL (ref 1.5–4.5)
Glucose: 90 mg/dL (ref 70–99)
Potassium: 4.4 mmol/L (ref 3.5–5.2)
Sodium: 140 mmol/L (ref 134–144)
Total Protein: 7.1 g/dL (ref 6.0–8.5)
eGFR: 99 mL/min/{1.73_m2} (ref >59)

## 2023-09-02 LAB — VITAMIN D 25 HYDROXY (VIT D DEFICIENCY, FRACTURES): Vit D, 25-Hydroxy: 33.1 ng/mL (ref 30.0–100.0)

## 2023-09-02 LAB — INSULIN, RANDOM: INSULIN: 21.5 u[IU]/mL (ref 2.6–24.9)

## 2023-09-13 ENCOUNTER — Other Ambulatory Visit (HOSPITAL_COMMUNITY): Payer: Self-pay

## 2023-09-14 ENCOUNTER — Other Ambulatory Visit (HOSPITAL_COMMUNITY): Payer: Self-pay

## 2023-10-13 ENCOUNTER — Other Ambulatory Visit (HOSPITAL_COMMUNITY): Payer: Self-pay

## 2023-10-13 ENCOUNTER — Encounter (INDEPENDENT_AMBULATORY_CARE_PROVIDER_SITE_OTHER): Payer: Self-pay | Admitting: Adult Health

## 2023-10-13 ENCOUNTER — Ambulatory Visit (INDEPENDENT_AMBULATORY_CARE_PROVIDER_SITE_OTHER): Admitting: Adult Health

## 2023-10-13 VITALS — BP 130/75 | HR 82 | Temp 98.6°F | Ht 67.5 in | Wt 213.0 lb

## 2023-10-13 DIAGNOSIS — R7303 Prediabetes: Secondary | ICD-10-CM | POA: Diagnosis not present

## 2023-10-13 DIAGNOSIS — Z6831 Body mass index (BMI) 31.0-31.9, adult: Secondary | ICD-10-CM

## 2023-10-13 DIAGNOSIS — E559 Vitamin D deficiency, unspecified: Secondary | ICD-10-CM

## 2023-10-13 DIAGNOSIS — Z6833 Body mass index (BMI) 33.0-33.9, adult: Secondary | ICD-10-CM

## 2023-10-13 DIAGNOSIS — E782 Mixed hyperlipidemia: Secondary | ICD-10-CM | POA: Diagnosis not present

## 2023-10-13 DIAGNOSIS — R632 Polyphagia: Secondary | ICD-10-CM

## 2023-10-13 MED ORDER — ROSUVASTATIN CALCIUM 40 MG PO TABS
40.0000 mg | ORAL_TABLET | Freq: Every day | ORAL | 0 refills | Status: DC
  Filled 2023-10-13: qty 30, 30d supply, fill #0
  Filled 2023-11-03: qty 90, 90d supply, fill #0

## 2023-10-13 MED ORDER — ZEPBOUND 12.5 MG/0.5ML ~~LOC~~ SOAJ
12.5000 mg | SUBCUTANEOUS | 0 refills | Status: DC
  Filled 2023-10-13 – 2023-11-03 (×2): qty 2, 28d supply, fill #0

## 2023-10-13 NOTE — Progress Notes (Addendum)
 WEIGHT SUMMARY AND BIOMETRICS  Vitals Temp: 98.6 F (37 C) BP: 130/75 Pulse Rate: 82 SpO2: 97 %   Anthropometric Measurements Height: 5' 7.5 (1.715 m) Weight: 213 lb (96.6 kg) BMI (Calculated): 32.85 Weight at Last Visit: 213 lb Weight Lost Since Last Visit: 0 Weight Gained Since Last Visit: 0 Starting Weight: 254 lb Total Weight Loss (lbs): 41 lb (18.6 kg)   Body Composition  Body Fat %: 24.3 % Fat Mass (lbs): 51.8 lbs Muscle Mass (lbs): 153.2 lbs Total Body Water (lbs): 112.6 lbs Visceral Fat Rating : 12   Other Clinical Data Fasting: no Labs: no Today's Visit #: 60 Starting Date: 04/09/19    Chief Complaint:   OBESITY Alejandro [name] is here to discuss his progress with his obesity treatment plan.  He is on the following a lower carbohydrate, vegetable and lean protein rich diet plan and states he is following his eating plan approximately 60 % of the time.  He states he is exercising Yoga/NEAT Activities 30 minutes 1-2/7 times per week.   Interim History:  Ms. Shillingford is still settling into his new job. He is still trying to incorporate more regular exercise/activity into his daily weekday/weekend routine. He and his family travelled to [place] for a week over Spring Break. He often travels on weekends, as both of his sons are in competitive/travel sports.  He is currently on weekly Zepbound  10mg  He endorses recent increased cravings, denies SE with GIP/GLP-1 therapy  Subjective:   1. Polyphagia He restarted weekly Zepbound  7.5mg  on/about 06/23/2023- tolerating well with exception of mild constipation. 09/01/2023- Zepbound  increased from 7.5mg  to 10mg  He is still experiencing breakthrough polyphagia Denies mass in neck, dysphagia, dyspepsia, persistent hoarseness, abdominal pain, or N/V/C   2. Prediabetes Discussed Labs Lab Results  Component Value Date   HGBA1C 5.4 09/01/2023   HGBA1C 5.8 (H) 11/17/2022   HGBA1C 5.7 (H) 04/21/2022      Latest Reference Range & Units 04/21/22 15:07 11/17/22 07:37 09/01/23 08:20  INSULIN  2.6 - 24.9 uIU/mL 14.6 30.3 (H) 21.5  (H): Data is abnormally high  A1c, CBG, and Insulin  levels ALL improved. A1c and CBG at goal Insulin  level still above goal of 5  He restarted weekly Zepbound  7.5mg  on/about 06/23/2023- tolerating well with exception of mild constipation. 09/01/2023- Zepbound  increased from 7.5mg  to 10mg  He is still experiencing breakthrough polyphagia Denies mass in neck, dysphagia, dyspepsia, persistent hoarseness, abdominal pain, or N/V/C   3. Vitamin D  deficiency Discussed Labs  Latest Reference Range & Units 09/01/23 08:20  Vitamin D , 25-Hydroxy 30.0 - 100.0 ng/mL 33.1  Vitamin B12 232 - 1,245 pg/mL 369   Vit D is below goal of 50-70 B12 level is low normal He is on weekly Ergocalciferol - denies N/V/Muscle Weakness  4. Mixed hyperlipidemia Discussed Labs Lipid Panel     Component Value Date/Time   CHOL 260 (H) 09/01/2023 0820   TRIG 233 (H) 09/01/2023 0820   HDL 55 09/01/2023 0820   CHOLHDL 4.7 09/01/2023 0820   CHOLHDL 2.5 10/15/2021 1134   VLDL 14 10/15/2021 1134   LDLCALC 162 (H) 09/01/2023 0820   LDLDIRECT 224.0 08/26/2017 1449   LABVLDL 43 (H) 09/01/2023 0820   The ASCVD Risk score (Arnett DK, et al., 2019) failed to calculate for the following reasons:   Risk score cannot be calculated because patient has a medical history suggesting prior/existing ASCVD   He reports that he was off statin therapy for weeks to months at time of lab  draw. He HAS been taking Crestor  40mg  each morning for the the last two weeks- denies myalgias He denies tobacco/vape use Discussed at length the importance of daily statin therapy, especially with his cardiac hx  Assessment/Plan:   1. Polyphagia (Primary) Increase protein and limit sugar/simple CHO  2. Prediabetes Increase protein and limit sugar/simple CHO  3. Vitamin D  deficiency Continue weekly Ergocalciferol - denies  N/V/Muscle Weakness  4. Mixed hyperlipidemia Refill- TAKE NIGHTLY - rosuvastatin  (CRESTOR ) 40 MG tablet; Take 1 tablet (40 mg total) by mouth daily.  Dispense: 90 tablet; Refill: 0  5. BMI 31.0-31.9,adult Refill and INCREASE  tirzepatide  (ZEPBOUND ) 12.5 MG/0.5ML Pen Inject 12.5 mg into the skin once a week. Dispense: 2 mL, Refills: 0 of 0 remaining   Alejandro [name] is currently in the action stage of change. As such, his goal is to continue with weight loss efforts. He has agreed to following a lower carbohydrate, vegetable and lean protein rich diet plan.   Exercise goals: For substantial health benefits, adults should do at least 150 minutes (2 hours and 30 minutes) a week of moderate-intensity, or 75 minutes (1 hour and 15 minutes) a week of vigorous-intensity aerobic physical activity, or an equivalent combination of moderate- and vigorous-intensity aerobic activity. Aerobic activity should be performed in episodes of at least 10 minutes, and preferably, it should be spread throughout the week.  Behavioral modification strategies: increasing lean protein intake, decreasing simple carbohydrates, increasing vegetables, decreasing eating out, no skipping meals, meal planning and cooking strategies, keeping healthy foods in the home, planning for success, and decreasing junk food.  Alejandro [name] has agreed to follow-up with our clinic in 4 weeks. He was informed of the importance of frequent follow-up visits to maximize his success with intensive lifestyle modifications for his multiple health conditions.   Objective:   Blood pressure 130/75, pulse 82, temperature 98.6 F (37 C), height 5' 7.5 (1.715 m), weight 213 lb (96.6 kg), SpO2 97%. Body mass index is 32.87 kg/m.  General: Cooperative, alert, well developed, in no acute distress. HEENT: Conjunctivae and lids unremarkable. Cardiovascular: Regular rhythm.  Lungs: Normal work of breathing. Neurologic: No focal deficits.   Lab Results   Component Value Date   CREATININE 0.97 09/01/2023   BUN 14 09/01/2023   NA 140 09/01/2023   K 4.4 09/01/2023   CL 103 09/01/2023   CO2 19 (L) 09/01/2023   Lab Results  Component Value Date   ALT 25 09/01/2023   AST 21 09/01/2023   ALKPHOS 63 09/01/2023   BILITOT 0.4 09/01/2023   Lab Results  Component Value Date   HGBA1C 5.4 09/01/2023   HGBA1C 5.8 (H) 11/17/2022   HGBA1C 5.7 (H) 04/21/2022   HGBA1C 5.2 07/07/2021   HGBA1C 5.8 (H) 02/03/2021   Lab Results  Component Value Date   INSULIN  21.5 09/01/2023   INSULIN  30.3 (H) 11/17/2022   INSULIN  14.6 04/21/2022   INSULIN  25.5 (H) 02/03/2021   INSULIN  14.2 06/04/2020   Lab Results  Component Value Date   TSH 1.180 11/17/2022   Lab Results  Component Value Date   CHOL 260 (H) 09/01/2023   HDL 55 09/01/2023   LDLCALC 162 (H) 09/01/2023   LDLDIRECT 224.0 08/26/2017   TRIG 233 (H) 09/01/2023   CHOLHDL 4.7 09/01/2023   Lab Results  Component Value Date   VD25OH 33.1 09/01/2023   VD25OH 40.0 11/17/2022   VD25OH 35.3 04/21/2022   Lab Results  Component Value Date   WBC 5.8 11/17/2022   HGB 15.2  11/17/2022   HCT 46.6 11/17/2022   MCV 89 11/17/2022   PLT 369 11/17/2022   No results found for: IRON, TIBC, FERRITIN  Attestation Statements:   Reviewed by clinician on day of visit: allergies, medications, problem list, medical history, surgical history, family history, social history, and previous encounter notes.  I have reviewed the above documentation for accuracy and completeness, and I agree with the above. -  Curt Oatis d. Monte Bronder, NP-C

## 2023-10-25 ENCOUNTER — Ambulatory Visit: Payer: Self-pay

## 2023-10-25 ENCOUNTER — Ambulatory Visit (HOSPITAL_COMMUNITY): Payer: Self-pay | Admitting: Cardiology

## 2023-10-25 ENCOUNTER — Ambulatory Visit (HOSPITAL_COMMUNITY)
Admission: RE | Admit: 2023-10-25 | Discharge: 2023-10-25 | Disposition: A | Discharge status: Home / Self Care | Source: Ambulatory Visit | Attending: Cardiology | Admitting: Cardiology

## 2023-10-25 ENCOUNTER — Other Ambulatory Visit (HOSPITAL_COMMUNITY): Payer: Self-pay

## 2023-10-25 VITALS — BP 142/108 | HR 75 | Wt 218.2 lb

## 2023-10-25 DIAGNOSIS — F32A Depression, unspecified: Secondary | ICD-10-CM | POA: Diagnosis not present

## 2023-10-25 DIAGNOSIS — H5500 Unspecified nystagmus: Secondary | ICD-10-CM | POA: Diagnosis not present

## 2023-10-25 DIAGNOSIS — E785 Hyperlipidemia, unspecified: Secondary | ICD-10-CM | POA: Diagnosis not present

## 2023-10-25 DIAGNOSIS — F419 Anxiety disorder, unspecified: Secondary | ICD-10-CM | POA: Diagnosis not present

## 2023-10-25 DIAGNOSIS — I5181 Takotsubo syndrome: Secondary | ICD-10-CM

## 2023-10-25 DIAGNOSIS — R42 Dizziness and giddiness: Secondary | ICD-10-CM

## 2023-10-25 DIAGNOSIS — I252 Old myocardial infarction: Secondary | ICD-10-CM | POA: Insufficient documentation

## 2023-10-25 DIAGNOSIS — I429 Cardiomyopathy, unspecified: Secondary | ICD-10-CM | POA: Insufficient documentation

## 2023-10-25 DIAGNOSIS — E782 Mixed hyperlipidemia: Secondary | ICD-10-CM

## 2023-10-25 DIAGNOSIS — Z8249 Family history of ischemic heart disease and other diseases of the circulatory system: Secondary | ICD-10-CM | POA: Diagnosis not present

## 2023-10-25 DIAGNOSIS — R110 Nausea: Secondary | ICD-10-CM | POA: Insufficient documentation

## 2023-10-25 DIAGNOSIS — E663 Overweight: Secondary | ICD-10-CM | POA: Insufficient documentation

## 2023-10-25 DIAGNOSIS — Z79899 Other long term (current) drug therapy: Secondary | ICD-10-CM | POA: Diagnosis not present

## 2023-10-25 DIAGNOSIS — I498 Other specified cardiac arrhythmias: Secondary | ICD-10-CM | POA: Diagnosis not present

## 2023-10-25 LAB — COMPREHENSIVE METABOLIC PANEL WITH GFR
ALT: 30 U/L (ref 0–44)
AST: 21 U/L (ref 15–41)
Albumin: 4.3 g/dL (ref 3.5–5.0)
Alkaline Phosphatase: 44 U/L (ref 38–126)
Anion gap: 12 (ref 5–15)
BUN: 13 mg/dL (ref 6–20)
CO2: 22 mmol/L (ref 22–32)
Calcium: 9.4 mg/dL (ref 8.9–10.3)
Chloride: 106 mmol/L (ref 98–111)
Creatinine, Ser: 0.85 mg/dL (ref 0.61–1.24)
GFR, Estimated: >60 mL/min (ref >60)
Glucose, Bld: 104 mg/dL — ABNORMAL HIGH (ref 70–99)
Potassium: 3.8 mmol/L (ref 3.5–5.1)
Sodium: 140 mmol/L (ref 135–145)
Total Bilirubin: 0.7 mg/dL (ref 0.0–1.2)
Total Protein: 7.2 g/dL (ref 6.5–8.1)

## 2023-10-25 LAB — BRAIN NATRIURETIC PEPTIDE: B Natriuretic Peptide: 19.1 pg/mL (ref 0.0–100.0)

## 2023-10-25 LAB — TSH: TSH: 1.316 u[IU]/mL (ref 0.350–4.500)

## 2023-10-25 LAB — TROPONIN I (HIGH SENSITIVITY): Troponin I (High Sensitivity): 3 ng/L (ref <18)

## 2023-10-25 MED ORDER — MECLIZINE HCL 25 MG PO TABS
25.0000 mg | ORAL_TABLET | Freq: Two times a day (BID) | ORAL | 0 refills | Status: DC | PRN
  Filled 2023-10-25: qty 30, 15d supply, fill #0

## 2023-10-25 NOTE — Telephone Encounter (Signed)
 Patient was seen by Dr. Mitzie Anda today

## 2023-10-25 NOTE — Patient Instructions (Signed)
 Great to see you today!!!  Medication Changes:  START Meclizine 25 mg Twice daily AS NEEDED for vertigo  Lab Work:  Labs done today, your results will be available in MyChart, we will contact you for abnormal readings.  Your physician recommends that you return for a FASTING lipid profile: 2 months  Special Instructions // Education:  Do the following things EVERYDAY: Weigh yourself in the morning before breakfast. Write it down and keep it in a log. Take your medicines as prescribed Eat low salt foods--Limit salt (sodium) to 2000 mg per day.  Stay as active as you can everyday Limit all fluids for the day to less than 2 liters   Follow-Up in: 6 months (November), **PLEASE CALL OUR OFFICE IN SEPTEMBER TO SCHEDULE THIS APPOINTMENT   At [facility], you and your health needs are our priority. We have a designated team specialized in the treatment of Heart Failure. This Care Team includes your primary Heart Failure Specialized Cardiologist (physician), Advanced Practice Providers (APPs- Physician Assistants and Nurse Practitioners), and Pharmacist who all work together to provide you with the care you need, when you need it.   You may see any of the following providers on your designated Care Team at your next follow up:  Dr. Jules Oar Dr. Peder Bourdon Dr. Alwin Baars Dr. Judyth Nunnery Nieves Bars, NP Ruddy Corral, [place] [facility] [place], [place] Dennise Fitz, NP [place] Lee, NP Luster Salters, PharmD   Please be sure to bring in all your medications bottles to every appointment.   Need to Contact Us :  If you have any questions or concerns before your next appointment please send us  a message through [place] or call our office at [phone].    TO LEAVE A MESSAGE FOR THE NURSE SELECT OPTION 2, PLEASE LEAVE A MESSAGE INCLUDING: YOUR NAME DATE OF BIRTH CALL BACK NUMBER REASON FOR CALL**this is important as we prioritize the call  backs  YOU WILL RECEIVE A CALL BACK THE SAME DAY AS LONG AS YOU CALL BEFORE 4:00 PM

## 2023-10-25 NOTE — Telephone Encounter (Signed)
  Chief Complaint: Dizziness Symptoms: moderate to severe dizziness, nausea Frequency: started last night Pertinent Negatives: Patient denies SOB Disposition: [x] ED /[] Urgent Care (no appt availability in office) / [] Appointment(In office/virtual)/ []  [facility]/ [] Home Care/ [] Refused Recommended Disposition /[] [facility]/ []  Follow-up with PCP Additional Notes: patient calling with concerns for significant dizziness and nausea. Patient had to leave work due to symptoms. Patient reports dizziness started last night. States he has been feeling lightheaded but that the room also feels like it is spinning. Patient reports several episodes of almost falling today due to dizziness. Patient states dizziness increases with changes in head position but also with body position changes. Patient endorses nausea as well. Patient has a hx of MI-reports feeling chest pain last night but states he isn't having chest pain right now.  Per protocol, patient is recommended to be seen in the Emergency Department. Patient agreed to go to be seen. Discussed with patient someone else driving him or calling 161. Patient states he would talk with his wife and have her take him. Patient verbalized understanding and all questions answered.   Copied from CRM [id number]. Topic: Clinical - Red Word Triage >> Oct 25, 2023 12:05 PM Fredrica W wrote: Red Word that prompted transfer to Nurse Triage: dizziness, nauseous - had to leave work Reason for Disposition  SEVERE dizziness (e.g., unable to stand, requires support to walk, feels like passing out now)  Answer Assessment - Initial Assessment Questions 1. DESCRIPTION: "Describe your dizziness."     Patient started last night. Patient states he almost fell down in the shower twice.  2. LIGHTHEADED: "Do you feel lightheaded?" (e.g., somewhat faint, woozy, weak upon standing)     Yes 3. VERTIGO: "Do you feel like either you or the room is spinning or  tilting?" (i.e. vertigo)     Yes 4. SEVERITY: "How bad is it?"  "Do you feel like you are going to faint?" "Can you stand and walk?"   - MILD: Feels slightly dizzy, but walking normally.   - MODERATE: Feels unsteady when walking, but not falling; interferes with normal activities (e.g., school, work).   - SEVERE: Unable to walk without falling, or requires assistance to walk without falling; feels like passing out now.      Moderate to Severe 5. ONSET:  "When did the dizziness begin?"     Started last night 6. AGGRAVATING FACTORS: "Does anything make it worse?" (e.g., standing, change in head position)     Changes in head position and going from sitting to standing 7. HEART RATE: "Can you tell me your heart rate?" "How many beats in 15 seconds?"  (Note: not all patients can do this)       Unable to say 8. CAUSE: "What do you think is causing the dizziness?"     unsure 9. RECURRENT SYMPTOM: "Have you had dizziness before?" If Yes, ask: "When was the last time?" "What happened that time?"     no 10. OTHER SYMPTOMS: "Do you have any other symptoms?" (e.g., fever, chest pain, vomiting, diarrhea, bleeding)       Fatigue,nausea, chest pain yesterday  Protocols used: Dizziness - Lightheadedness-A-AH

## 2023-10-25 NOTE — Progress Notes (Signed)
 PCP: Donnie Galea, MD Cardiology: Dr. Mitzie Anda  Chief complaint: dizziness/weakness  43 y.o. 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  Patient had no prior cardiac history.  He has been on atorvastatin  for elevated cholesterol and has been on tirzepatide  to aid with weight loss.  He lost his job right before Christmas 2022 and has been under a lot of stress.  He began to note episodes of chest pain after this, mild squeezing in the central chest with stress.  He went skiing with his kids in 1/23.  At the top of a slope, he developed severe substernal chest tightness that did not relent.  He was taken to the hospital in [place] by EMS, ECG abnormalities were noted.  Troponin was mildly elevated. CTA chest showed no PE or dissection.  He had LHC, showing no significant coronary disease but there was apical dyskinesis and overall EF 60%.  This was thought to be consistent with stress (Takotsubo-type) cardiomyopathy.  He has a family history of CAD/MI, no h/o cardiomyopathy.  No recent viral illness or fever.  He does not smoke or drink ETOH.   Echo was done in 1/23.  This showed EF 55-60%, severe hypokinesis in the mid septum.  Cardiac MRI in 2/23 showed LV EF 55% with mild hypokinesis in the mid septum, normal RV, no LGE.   Echo was done in 5/23 showing EF 60-65%, normal diastolic function with normal GLS, no regional wall motion abnormalities, normal RV.   Patient returns for evaluation of dizziness/weakness. He had been doing well recently, no exertional dyspnea.  He gets occasional atypical chest pain (sharp, left-sided, no trigger).  This has been chronic.  Yesterday, he was in a meeting at work and noted that he felt really dizzy.  He had to leave the meeting.  Since then, the dizziness has not completely resolved.  He describes it as a "spinning sensation," not necessarily lightheadedness (does not feel like he is  going to pass out).  Dizziness is worse if he turns his head to the side.  He feels like his balance is off though he has not fallen.  He has has some nausea, no vomiting.  No hearing loss.  No focal weakness or numbness.  No recent viral illness.    I performed a point-of-care echocardiogram using the hand-held device.  LV systolic function appeared normal with EF 55-60%, normal RV systolic function.    ECG (personally reviewed): NSR, inferolateral TWIs, QTc mildly prolonged at 499 msec (inferolateral TWIs are unchanged from prior ECG).   Labs (8/22): LDL 77, HDL 86 Labs (1/23): K 4, creatinine 0.91, plts 442, LDL 88 Labs (4/23): LDL 93 Labs (3/25): LDL 162, K 4.4, creatinine 0.97  PMH: 1. Hyperlipidemia 2. Depression/anxiety 3. Pre-diabetes 4. H/o ruptured disc in back. 5. Stress (Takotsubo-type) cardiomyopathy: NSTEMI in 1/23 while skiing.   - LHC (1/23): No significant coronary disease, apical dyskinesis on LV-gram with EF 60%.  - Echo (1/23): EF 55-60%, severe hypokinesis in the mid septum - Cardiac MRI (2/23): LV EF 55% with mild hypokinesis in the mid septum, normal RV, no LGE.  - Echo (5/23): EF 60-65%, normal diastolic function with normal GLS, no regional wall motion abnormalities, normal RV.   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  No smoking, rare ETOH.   FH: Grandfather with MI in his 49s, other grandfather with MI in his 71s.   ROS: All systems reviewed and negative  except as per HPI.   Current Outpatient Medications  Medication Sig Dispense Refill   ALPRAZolam  (XANAX ) 0.25 MG tablet Take 1 tablet (0.25 mg total) by mouth 2 (two) times daily as needed for anxiety. 20 tablet 0   cyclobenzaprine (FLEXERIL) 10 MG tablet Take 10 mg by mouth as needed.     guaiFENesin -codeine  100-10 MG/5ML syrup Take 5 mLs by mouth every 6 (six) hours as needed for cough. 100 mL 0   meclizine (ANTIVERT) 25 MG tablet Take 1 tablet (25 mg total) by mouth 2 (two) times daily  as needed for dizziness. 30 tablet 0   rosuvastatin  (CRESTOR ) 40 MG tablet Take 1 tablet (40 mg total) by mouth daily. 90 tablet 0   tirzepatide  (ZEPBOUND ) 12.5 MG/0.5ML Pen Inject 12.5 mg into the skin once a week. (Patient not taking: Reported on 10/25/2023) 2 mL 0   Vitamin D , Ergocalciferol , (DRISDOL ) 1.25 MG (50000 UNIT) CAPS capsule Take 1 capsule (50,000 Units total) by mouth every 7 (seven) days. (Patient not taking: Reported on 10/25/2023) 5 capsule 0   No current facility-administered medications for this encounter.   Blood pressure (!) 142/108, pulse 75, weight 99 kg (218 lb 3.2 oz), SpO2 97%. General: NAD Neck: No JVD, no thyromegaly or thyroid nodule.  Lungs: Clear to auscultation bilaterally with normal respiratory effort. CV: Nondisplaced PMI.  Heart regular S1/S2, no S3/S4, no murmur.  No peripheral edema.  No carotid bruit.  Normal pedal pulses.  Abdomen: Soft, nontender, no hepatosplenomegaly, no distention.  Skin: Intact without lesions or rashes.  Neurologic: Alert and oriented x 3. No focal weakness.  Nystagmus noted with gaze to the right.  Psych: Normal affect. Extremities: No clubbing or cyanosis.  HEENT: Nystagmus noted with gaze to the right  Assessment/Plan: 1. Cardiomyopathy: Patient presented with NSTEMI in 1/23, cath showed no significant coronary disease with LV-gram showing EF 60% but apical dyskinesis.  Given significant stressors and anxiety, this episode most likely represents a stress (Takotsubo-type) cardiomyopathy.  No viral prodrome to suggest viral myocarditis.  Echo in 1/23 showed LV EF 55-60% with severe hypokinesis in the mid septum, which is an unusual pattern for Takotsubo.  Cardiac MRI done in 2/23 showed EF 59% with very mild mid septal hypokinesis.  There was no delayed enhancement, so improving wall motion abnormality + lack of scar does suggest a stress cardiomyopathy. Fitting this diagnosis, echo in 5/23 showed normal LV systolic function and wall  motion. Today, point-of-care echo by myself showed LV EF 55-60%, RV with normal function.  He is euvolemic on exam, NYHA class I symptoms.  ECG today shows unchanged TWIs compared to prior.  - Will send HS-TnI and BNP, but as noted below, think symptoms are less likely cardiac.  2. Hyperlipidemia: Given strong family history of CAD (GF with MI in early 30s), would like to see LDL < 70. Most recent labs in 3/25 showed LDL 162, he had been off Crestor .  - Restart Crestor  40 mg daily.  Lipids/LFTs in 2 months.  3. Depression/anxiety: Working with a psychologist, controlled.  4. Overweight: [facility] and is taking tirzepatide .  5. "Dizziness:" This sounds most like positional vertigo based on his description.  He has some nystagmus with rightward gaze.  No viral illness recently, no hearing loss.  He has some nausea.  He does not have any other neurological sequelae to suggest a CNS etiology.  - I will give him prn meclizine.  - He  will followup with PCP, if no improvement may need neurology for vestibular rehab.   Followup in 1 year.   I spent 41 minutes reviewing data, interviewing patient, and organizing the orders/followup.    Peder Bourdon 10/25/2023

## 2023-10-26 ENCOUNTER — Ambulatory Visit (INDEPENDENT_AMBULATORY_CARE_PROVIDER_SITE_OTHER): Admitting: Family Medicine

## 2023-10-26 ENCOUNTER — Encounter: Payer: Self-pay | Admitting: Family Medicine

## 2023-10-26 VITALS — BP 138/82 | HR 82 | Temp 98.3°F | Ht 67.5 in | Wt 215.2 lb

## 2023-10-26 DIAGNOSIS — H8110 Benign paroxysmal vertigo, unspecified ear: Secondary | ICD-10-CM | POA: Insufficient documentation

## 2023-10-26 NOTE — Assessment & Plan Note (Signed)
 Acute, most likely BPPV No red flags for urgent imaging.  Normal neuroexam with only evidence of head motion triggered nystagmus. Had negative cardiac evaluation yesterday. No viral infection symptoms, patient just feels tired. Lab evaluation unremarkable. Given information on desensitization exercises.  Patient can use meclizine as needed. Recommended patient not to drive until symptoms significantly better.  Note for work written for patient to remain out until next Monday.  If symptoms are not improving as expected  in 2 week or if additional neurologic symptoms, he was instructed to contact us  for possible Epley maneuver, balance retraining PT referral.  Patient voiced understanding.

## 2023-10-26 NOTE — Progress Notes (Signed)
 Patient ID: Alejandro [name], male    DOB: [birth year], 43 y.o.   MRN: [id number]  This visit was conducted in person.  BP 138/82   Pulse 82   Temp 98.3 F (36.8 C) (Oral)   Ht 5' 7.5" (1.715 m)   Wt 215 lb 3.2 oz (97.6 kg)   SpO2 96%   BMI 33.21 kg/m    CC:  Chief Complaint  Patient presents with   Cardiology Follow up    See phone note from yesterday. Patient did not go to the ED but saw Dr[facility] and was told that he had possible positional vertigo     Subjective:   HPI: Alejandro [name] is a 43 y.o. male presenting on 10/26/2023 for Cardiology Follow up (See phone note from yesterday. Patient did not go to the ED but saw Dr. Mitzie [facility]a [facility] and was told that he had possible positional vertigo )  This patient of Dr. Vallarie Gauze was seen by his cardiologist yesterday Dr. Mitzie Anda for dizziness. Labs including complete metabolic panel, BNP, troponin I, TSH were checked and unremarkable.  EKG was unremarkable.   Had noted during a meeting at work sudden onset severe dizziness 3 days ago  Describes as spinning sensation not clearly lightheadedness.  Feels like he is off balance. Vertigo triggered by moving head. A point-of-care echocardiogram was performed at the cardiologist and showed ejection fraction 55 to 60%..  He has a history of cardiomyopathy, NSTEMI, CAD EKG showed normal sinus rhythm Cardiology felt symptoms were most likely secondary to BPPV No viral illness no recent hearing loss.  Some associated nausea. Started on as needed meclizine Relevant past medical, surgical, family and social history reviewed and updated as indicated. Interim medical history since our last visit reviewed. Allergies and medications reviewed and updated. Outpatient Medications Prior to Visit  Medication Sig Dispense Refill   ALPRAZolam  (XANAX ) 0.25 MG tablet Take 1 tablet (0.25 mg total) by mouth 2 (two) times daily as needed for anxiety. 20 tablet 0    cyclobenzaprine (FLEXERIL) 10 MG tablet Take 10 mg by mouth as needed.     guaiFENesin -codeine  100-10 MG/5ML syrup Take 5 mLs by mouth every 6 (six) hours as needed for cough. 100 mL 0   meclizine (ANTIVERT) 25 MG tablet Take 1 tablet (25 mg total) by mouth 2 (two) times daily as needed for dizziness. 30 tablet 0   rosuvastatin  (CRESTOR ) 40 MG tablet Take 1 tablet (40 mg total) by mouth daily. 90 tablet 0   tirzepatide  (ZEPBOUND ) 12.5 MG/0.5ML Pen Inject 12.5 mg into the skin once a week. (Patient not taking: Reported on 10/26/2023) 2 mL 0   Vitamin D , Ergocalciferol , (DRISDOL ) 1.25 MG (50000 UNIT) CAPS capsule Take 1 capsule (50,000 Units total) by mouth every 7 (seven) days. (Patient not taking: Reported on 10/26/2023) 5 capsule 0   No facility-administered medications prior to visit.     Per HPI unless specifically indicated in ROS section below Review of Systems  Constitutional:  Negative for fatigue and fever.  HENT:  Negative for ear pain.   Eyes:  Negative for pain.  Respiratory:  Negative for cough and shortness of breath.   Cardiovascular:  Negative for chest pain, palpitations and leg swelling.  Gastrointestinal:  Negative for abdominal pain.  Genitourinary:  Negative for dysuria.  Musculoskeletal:  Negative for arthralgias.  Neurological:  Negative for syncope, light-headedness and headaches.  Psychiatric/Behavioral:  Negative for dysphoric mood.  Objective:  BP 138/82   Pulse 82   Temp 98.3 F (36.8 C) (Oral)   Ht 5' 7.5" (1.715 m)   Wt 215 lb 3.2 oz (97.6 kg)   SpO2 96%   BMI 33.21 kg/m   Wt Readings from Last 3 Encounters:  10/26/23 215 lb 3.2 oz (97.6 kg)  10/25/23 218 lb 3.2 oz (99 kg)  10/13/23 213 lb (96.6 kg)      Physical Exam Constitutional:      Appearance: He is well-developed.  HENT:     Head: Normocephalic.     Right Ear: Hearing normal.     Left Ear: Hearing normal.     Nose: Nose normal.  Eyes:     General: Lids are normal. Vision grossly  intact. Gaze aligned appropriately.     Extraocular Movements:     Right eye: Nystagmus present. Normal extraocular motion.     Left eye: Nystagmus present. Normal extraocular motion.     Conjunctiva/sclera: Conjunctivae normal.     Comments: Nystagmuc mild triggered by head motion and eye motion  Neck:     Thyroid: No thyroid mass or thyromegaly.     Vascular: No carotid bruit.     Trachea: Trachea normal.  Cardiovascular:     Rate and Rhythm: Normal rate and regular rhythm.     Pulses: Normal pulses.     Heart sounds: Heart sounds not distant. No murmur heard.    No friction rub. No gallop.     Comments: No peripheral edema Pulmonary:     Effort: Pulmonary effort is normal. No respiratory distress.     Breath sounds: Normal breath sounds.  Skin:    General: Skin is warm and dry.     Findings: No rash.  Neurological:     General: No focal deficit present.     Mental Status: He is alert and oriented to person, place, and time.     Cranial Nerves: Cranial nerves 2-12 are intact.     Sensory: Sensation is intact.     Motor: Motor function is intact.     Coordination: Coordination is intact.     Gait: Gait is intact.  Psychiatric:        Speech: Speech normal.        Behavior: Behavior normal.        Thought Content: Thought content normal.       Results for orders placed or performed during the hospital encounter of 10/25/23  B Nat Peptide   Collection Time: 10/25/23  3:09 PM  Result Value Ref Range   B Natriuretic Peptide 19.1 0.0 - 100.0 pg/mL  TSH   Collection Time: 10/25/23  3:09 PM  Result Value Ref Range   TSH 1.316 0.350 - 4.500 uIU/mL  Comprehensive Metabolic Panel (CMET)   Collection Time: 10/25/23  3:09 PM  Result Value Ref Range   Sodium 140 135 - 145 mmol/L   Potassium 3.8 3.5 - 5.1 mmol/L   Chloride 106 98 - 111 mmol/L   CO2 22 22 - 32 mmol/L   Glucose, Bld 104 (H) 70 - 99 mg/dL   BUN 13 6 - 20 mg/dL   Creatinine, Ser 1.61 0.61 - 1.24 mg/dL   Calcium   9.4 8.9 - 10.3 mg/dL   Total Protein 7.2 6.5 - 8.1 g/dL   Albumin 4.3 3.5 - 5.0 g/dL   AST 21 15 - 41 U/L   ALT 30 0 - 44 U/L   Alkaline Phosphatase 44 38 -  126 U/L   Total Bilirubin 0.7 0.0 - 1.2 mg/dL   GFR, Estimated >81 >19 mL/min   Anion gap 12 5 - 15  Troponin I (High Sensitivity)   Collection Time: 10/25/23  3:09 PM  Result Value Ref Range   Troponin I (High Sensitivity) 3 <18 ng/L    Assessment and Plan  Benign paroxysmal positional vertigo, unspecified laterality Assessment & Plan: Acute, most likely BPPV No red flags for urgent imaging.  Normal neuroexam with only evidence of head motion triggered nystagmus. Had negative cardiac evaluation yesterday. No viral infection symptoms, patient just feels tired. Lab evaluation unremarkable. Given information on desensitization exercises.  Patient can use meclizine as needed. Recommended patient not to drive until symptoms significantly better.  Note for work written for patient to remain out until next Monday.  If symptoms are not improving as expected  in 2 week or if additional neurologic symptoms, he was instructed to contact us  for possible Epley maneuver, balance retraining PT referral.  Patient voiced understanding.     No follow-ups on file.   Herby Lolling, MD

## 2023-10-26 NOTE — Telephone Encounter (Signed)
 Noted. Thanks.

## 2023-11-03 ENCOUNTER — Other Ambulatory Visit (HOSPITAL_COMMUNITY): Payer: Self-pay

## 2023-11-22 ENCOUNTER — Ambulatory Visit (INDEPENDENT_AMBULATORY_CARE_PROVIDER_SITE_OTHER): Admitting: Adult Health

## 2023-11-23 ENCOUNTER — Ambulatory Visit (INDEPENDENT_AMBULATORY_CARE_PROVIDER_SITE_OTHER): Admitting: Family Medicine

## 2023-12-22 ENCOUNTER — Other Ambulatory Visit (HOSPITAL_COMMUNITY): Payer: Self-pay

## 2023-12-22 ENCOUNTER — Other Ambulatory Visit: Payer: Self-pay

## 2023-12-22 ENCOUNTER — Encounter (INDEPENDENT_AMBULATORY_CARE_PROVIDER_SITE_OTHER): Payer: Self-pay | Admitting: Adult Health

## 2023-12-22 ENCOUNTER — Ambulatory Visit (INDEPENDENT_AMBULATORY_CARE_PROVIDER_SITE_OTHER): Admitting: Adult Health

## 2023-12-22 VITALS — BP 134/83 | HR 76 | Temp 98.7°F | Ht 67.5 in | Wt 206.0 lb

## 2023-12-22 DIAGNOSIS — R632 Polyphagia: Secondary | ICD-10-CM | POA: Diagnosis not present

## 2023-12-22 DIAGNOSIS — E782 Mixed hyperlipidemia: Secondary | ICD-10-CM | POA: Diagnosis not present

## 2023-12-22 DIAGNOSIS — Z6831 Body mass index (BMI) 31.0-31.9, adult: Secondary | ICD-10-CM

## 2023-12-22 DIAGNOSIS — E669 Obesity, unspecified: Secondary | ICD-10-CM

## 2023-12-22 DIAGNOSIS — Z6833 Body mass index (BMI) 33.0-33.9, adult: Secondary | ICD-10-CM

## 2023-12-22 DIAGNOSIS — E559 Vitamin D deficiency, unspecified: Secondary | ICD-10-CM

## 2023-12-22 DIAGNOSIS — H8110 Benign paroxysmal vertigo, unspecified ear: Secondary | ICD-10-CM | POA: Diagnosis not present

## 2023-12-22 MED ORDER — ZEPBOUND 12.5 MG/0.5ML ~~LOC~~ SOAJ
12.5000 mg | SUBCUTANEOUS | 2 refills | Status: DC
  Filled 2023-12-22: qty 2, 28d supply, fill #0

## 2023-12-22 MED ORDER — VITAMIN D (ERGOCALCIFEROL) 1.25 MG (50000 UNIT) PO CAPS
50000.0000 [IU] | ORAL_CAPSULE | ORAL | 0 refills | Status: DC
  Filled 2023-12-22 – 2024-03-06 (×2): qty 4, 28d supply, fill #0

## 2023-12-22 NOTE — Progress Notes (Signed)
 WEIGHT SUMMARY AND BIOMETRICS  Vitals Temp: 98.7 F (37.1 C) BP: 134/83 Pulse Rate: 76 SpO2: 98 %   Anthropometric Measurements Height: 5' 7.5 (1.715 m) Weight: 206 lb (93.4 kg) BMI (Calculated): 31.77 Weight at Last Visit: 213 lb Weight Lost Since Last Visit: 7 lb Weight Gained Since Last Visit: 0 Starting Weight: 254 lb Total Weight Loss (lbs): 48 lb (21.8 kg)   Body Composition  Body Fat %: 22.7 % Fat Mass (lbs): 46.8 lbs Muscle Mass (lbs): 151.4 lbs Total Body Water (lbs): 109.2 lbs Visceral Fat Rating : 11   Other Clinical Data Fasting: yes Labs: no Today's Visit #: 52 Starting Date: 04/09/19    Chief Complaint:   OBESITY Mario is here to discuss his progress with his obesity treatment plan.  He is on the the Category 4 Plan + 200and states he is following his eating plan approximately 85-90 % of the time.  He states he is exercising Walking/Yoga 60 minutes 2/1 times per week.   Interim History:  He has added yoga practice into walking program!  Reviewed Bioimpedance results with pt: Muscle Mass: -1.8 lbs Adipose Mass: -5 lbs  He restarted weekly Zepbound  7.5mg  on/about 06/23/2023- tolerating well with exception of mild constipation. He restarted GLP-1/GIP therapy after a 3-4 week hiatus from injections. 09/01/2023 Zepbound  7.5mg  increased to 10mg  10/13/2023 Zepbound  10mg  increased to 12.5mg  Due to lack of OV f/u- he has been off weekly Zepbound  12.5mg  for 2-3 weeks He is THRILLED to have lost weight while off GIP/GLP-1 therapy. He endorses increased food noise while off Zepbound  therapy  Subjective:   1. Benign paroxysmal positional vertigo, unspecified laterality 10/26/2023 PCP OV Notes: Subjective Patient ID: BASTION BOLGER, male    DOB: [birth year], 43 y.o.   MRN: [id number]   This visit was conducted in person.   BP 138/82   Pulse 82   Temp 98.3 F (36.8 C) (Oral)   Ht 5' 7.5 (1.715 m)   Wt 215 lb 3.2 oz (97.6 kg)   SpO2 96%    BMI 33.21 kg/m     CC:      Chief Complaint  Patient presents with   Cardiology Follow up      See phone note from yesterday. Patient did not go to the ED but saw Dr. [facility]an [facility] and was told that he had possible positional vertigo     Subjective:    HPI: AEMON KOELLER is a 43 y.o. male presenting on 10/26/2023 for Cardiology Follow up (See phone note from yesterday. Patient did not go to the ED but saw Dr. Rolan at the [facility] and was told that he had possible positional vertigo )   This patient of Dr. Cleatus was seen by his cardiologist yesterday Dr. Rolan for dizziness. Labs including complete metabolic panel, BNP, troponin I, TSH were checked and unremarkable.  EKG was unremarkable.     Had noted during a meeting at work sudden onset severe dizziness 3 days ago  Describes as spinning sensation not clearly lightheadedness.  Feels like he is off balance. Vertigo triggered by moving head. A point-of-care echocardiogram was performed at the cardiologist and showed ejection fraction 55 to 60%..  He has a history of cardiomyopathy, NSTEMI, CAD EKG showed normal sinus rhythm Cardiology felt symptoms were most likely secondary to BPPV No viral illness no recent hearing loss.  Some associated nausea. Started on as needed meclizine  Relevant past medical, surgical, family  and social history reviewed and updated as indicated. Interim medical history since our last visit reviewed. Allergies and medications reviewed and updated.  2. Vitamin D  deficiency  Latest Reference Range & Units 04/21/22 15:07 11/17/22 07:37 09/01/23 08:20  Vitamin D , 25-Hydroxy 30.0 - 100.0 ng/mL 35.3 40.0 33.1   He is on weekly Ergocalciferol - denies N/V/Muscle Weakness  3. Mixed hyperlipidemia Lipid Panel     Component Value Date/Time   CHOL 260 (H) 09/01/2023 0820   TRIG 233 (H) 09/01/2023 0820   HDL 55 09/01/2023 0820   CHOLHDL 4.7 09/01/2023 0820   CHOLHDL 2.5  10/15/2021 1134   VLDL 14 10/15/2021 1134   LDLCALC 162 (H) 09/01/2023 0820   LDLDIRECT 224.0 08/26/2017 1449   LABVLDL 43 (H) 09/01/2023 0820   The ASCVD Risk score (Arnett DK, et al., 2019) failed to calculate for the following reasons:   Risk score cannot be calculated because patient has a medical history suggesting prior/existing ASCVD   10/25/2023 Heart Failure OV Notes: 2. Hyperlipidemia: Given strong family history of CAD (GF with MI in early 30s), would like to see LDL < 70. Most recent labs in 3/25 showed LDL 162, he had been off Crestor .  - Restart Crestor  40 mg daily.  Lipids/LFTs in 2 months.   4. Polyphagia He restarted weekly Zepbound  7.5mg  on/about 06/23/2023- tolerating well with exception of mild constipation. He restarted GLP-1/GIP therapy after a 3-4 week hiatus from injections. 09/01/2023 Zepbound  7.5mg  increased to 10mg  10/13/2023 Zepbound  10mg  increased to 12.5mg   Assessment/Plan:   1. Benign paroxysmal positional vertigo, unspecified laterality Per PCP Assessment & Plan: Acute, most likely BPPV No red flags for urgent imaging.  Normal neuroexam with only evidence of head motion triggered nystagmus. Had negative cardiac evaluation yesterday. No viral infection symptoms, patient just feels tired. Lab evaluation unremarkable. Given information on desensitization exercises.  Patient can use meclizine  as needed. Recommended patient not to drive until symptoms significantly better.  Note for work written for patient to remain out until next Monday.   If symptoms are not improving as expected  in 2 week or if additional neurologic symptoms, he was instructed to contact us  for possible Epley maneuver, balance retraining PT referral.  Patient voiced understanding.  2. Vitamin D  deficiency (Primary) Refill Vitamin D , Ergocalciferol , (DRISDOL ) 1.25 MG (50000 UNIT) CAPS capsule Take 1 capsule (50,000 Units total) by mouth every 7 (seven) days. Dispense: 5 capsule, Refills: 0  ordered   3. Mixed hyperlipidemia Continue healthy eating and regular exercise Continue statin and f/u with Heart Failure as direcrted  4. Polyphagia Refill  tirzepatide  (ZEPBOUND ) 12.5 MG/0.5ML Pen Inject 12.5 mg into the skin once a week. Dispense: 6 mL, Refills: 2 of 2 remaining   5. BMI 31.0-31.9,adult, CURRENT BMI 7 Refill  tirzepatide  (ZEPBOUND ) 12.5 MG/0.5ML Pen Inject 12.5 mg into the skin once a week. Dispense: 6 mL, Refills: 2 of 2 remaining   Joffre is currently in the action stage of change. As such, his goal is to continue with weight loss efforts. He has agreed to the Category 4 Plan. +200  Exercise goals: For substantial health benefits, adults should do at least 150 minutes (2 hours and 30 minutes) a week of moderate-intensity, or 75 minutes (1 hour and 15 minutes) a week of vigorous-intensity aerobic physical activity, or an equivalent combination of moderate- and vigorous-intensity aerobic activity. Aerobic activity should be performed in episodes of at least 10 minutes, and preferably, it should be spread throughout the week.  Behavioral  modification strategies: increasing lean protein intake, decreasing simple carbohydrates, increasing vegetables, increasing water intake, no skipping meals, meal planning and cooking strategies, keeping healthy foods in the home, and planning for success.  Tavien has agreed to follow-up with our clinic in 4 weeks. He was informed of the importance of frequent follow-up visits to maximize his success with intensive lifestyle modifications for his multiple health conditions.   Asked pt to contact me if Zepbound  not covered Options 1) Cash Option with [employer] 2) Replace Zepbound  with Wegovy   Objective:   Blood pressure 134/83, pulse 76, temperature 98.7 F (37.1 C), height 5' 7.5 (1.715 m), weight 206 lb (93.4 kg), SpO2 98%. Body mass index is 31.79 kg/m.  General: Cooperative, alert, well developed, in no acute  distress. HEENT: Conjunctivae and lids unremarkable. Cardiovascular: Regular rhythm.  Lungs: Normal work of breathing. Neurologic: No focal deficits.   Lab Results  Component Value Date   CREATININE 0.85 10/25/2023   BUN 13 10/25/2023   NA 140 10/25/2023   K 3.8 10/25/2023   CL 106 10/25/2023   CO2 22 10/25/2023   Lab Results  Component Value Date   ALT 30 10/25/2023   AST 21 10/25/2023   ALKPHOS 44 10/25/2023   BILITOT 0.7 10/25/2023   Lab Results  Component Value Date   HGBA1C 5.4 09/01/2023   HGBA1C 5.8 (H) 11/17/2022   HGBA1C 5.7 (H) 04/21/2022   HGBA1C 5.2 07/07/2021   HGBA1C 5.8 (H) 02/03/2021   Lab Results  Component Value Date   INSULIN  21.5 09/01/2023   INSULIN  30.3 (H) 11/17/2022   INSULIN  14.6 04/21/2022   INSULIN  25.5 (H) 02/03/2021   INSULIN  14.2 06/04/2020   Lab Results  Component Value Date   TSH 1.316 10/25/2023   Lab Results  Component Value Date   CHOL 260 (H) 09/01/2023   HDL 55 09/01/2023   LDLCALC 162 (H) 09/01/2023   LDLDIRECT 224.0 08/26/2017   TRIG 233 (H) 09/01/2023   CHOLHDL 4.7 09/01/2023   Lab Results  Component Value Date   VD25OH 33.1 09/01/2023   VD25OH 40.0 11/17/2022   VD25OH 35.3 04/21/2022   Lab Results  Component Value Date   WBC 5.8 11/17/2022   HGB 15.2 11/17/2022   HCT 46.6 11/17/2022   MCV 89 11/17/2022   PLT 369 11/17/2022   No results found for: IRON, TIBC, FERRITIN  Attestation Statements:   Reviewed by clinician on day of visit: allergies, medications, problem list, medical history, surgical history, family history, social history, and previous encounter notes.  I have reviewed the above documentation for accuracy and completeness, and I agree with the above. -  Kwame Ryland d. Tanna Loeffler, NP-C

## 2023-12-26 ENCOUNTER — Other Ambulatory Visit (HOSPITAL_COMMUNITY)

## 2023-12-28 ENCOUNTER — Ambulatory Visit (INDEPENDENT_AMBULATORY_CARE_PROVIDER_SITE_OTHER): Admitting: Family Medicine

## 2023-12-28 ENCOUNTER — Telehealth: Payer: Self-pay | Admitting: Family Medicine

## 2023-12-28 ENCOUNTER — Encounter: Payer: Self-pay | Admitting: Family Medicine

## 2023-12-28 VITALS — BP 116/78 | HR 70 | Temp 98.2°F | Ht 67.5 in | Wt 208.0 lb

## 2023-12-28 DIAGNOSIS — F418 Other specified anxiety disorders: Secondary | ICD-10-CM

## 2023-12-28 DIAGNOSIS — I5181 Takotsubo syndrome: Secondary | ICD-10-CM | POA: Diagnosis not present

## 2023-12-28 NOTE — Patient Instructions (Signed)
 I'm so sorry you are struggling right now  I feel you need some urgent mental health care   I think you need to leave your job for your own mental and physical safety    Go ahead and walk in to this office   [facility] center [address] [place], [place] [address]  [phone]  If you do feel you want to harm yourself- this is the place to go (or closest ER if that is quicker)
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
## 2023-12-28 NOTE — Telephone Encounter (Signed)
 Please check on patient on Thursday.  He was seen by Dr. Randeen recently and recommended to follow-up with behavioral health.  Please let me know about his status.  Thanks.

## 2023-12-28 NOTE — Assessment & Plan Note (Signed)
 Noted this history in past In present severe stress rxn pt is worried this will happen again   Normal vitals and exam thankfully  Will seek out urgent mental health help at [facility] center

## 2023-12-29 ENCOUNTER — Encounter (HOSPITAL_COMMUNITY): Payer: Self-pay | Admitting: Psychiatry

## 2023-12-29 ENCOUNTER — Telehealth (HOSPITAL_COMMUNITY): Payer: Self-pay | Admitting: Vascular Surgery

## 2023-12-29 ENCOUNTER — Ambulatory Visit (HOSPITAL_COMMUNITY)
Admission: EM | Admit: 2023-12-29 | Discharge: 2023-12-29 | Disposition: A | Discharge status: Other Acute Inpatient Hospital | Attending: Psychiatry | Admitting: Psychiatry

## 2023-12-29 ENCOUNTER — Other Ambulatory Visit: Payer: Self-pay

## 2023-12-29 ENCOUNTER — Inpatient Hospital Stay (HOSPITAL_COMMUNITY)
Admission: AD | Admit: 2023-12-29 | Discharge: 2024-01-01 | DRG: 885 | Disposition: A | Discharge status: Home / Self Care | Source: Intra-hospital

## 2023-12-29 DIAGNOSIS — E559 Vitamin D deficiency, unspecified: Secondary | ICD-10-CM | POA: Diagnosis present

## 2023-12-29 DIAGNOSIS — F410 Panic disorder [episodic paroxysmal anxiety] without agoraphobia: Secondary | ICD-10-CM | POA: Diagnosis present

## 2023-12-29 DIAGNOSIS — Z83438 Family history of other disorder of lipoprotein metabolism and other lipidemia: Secondary | ICD-10-CM | POA: Diagnosis not present

## 2023-12-29 DIAGNOSIS — Z7985 Long-term (current) use of injectable non-insulin antidiabetic drugs: Secondary | ICD-10-CM | POA: Diagnosis not present

## 2023-12-29 DIAGNOSIS — F332 Major depressive disorder, recurrent severe without psychotic features: Secondary | ICD-10-CM | POA: Insufficient documentation

## 2023-12-29 DIAGNOSIS — R45851 Suicidal ideations: Secondary | ICD-10-CM | POA: Diagnosis present

## 2023-12-29 DIAGNOSIS — Z8249 Family history of ischemic heart disease and other diseases of the circulatory system: Secondary | ICD-10-CM | POA: Diagnosis not present

## 2023-12-29 DIAGNOSIS — I5181 Takotsubo syndrome: Secondary | ICD-10-CM | POA: Diagnosis not present

## 2023-12-29 DIAGNOSIS — Z818 Family history of other mental and behavioral disorders: Secondary | ICD-10-CM

## 2023-12-29 DIAGNOSIS — Z833 Family history of diabetes mellitus: Secondary | ICD-10-CM | POA: Diagnosis not present

## 2023-12-29 DIAGNOSIS — K219 Gastro-esophageal reflux disease without esophagitis: Secondary | ICD-10-CM | POA: Diagnosis not present

## 2023-12-29 DIAGNOSIS — I252 Old myocardial infarction: Secondary | ICD-10-CM | POA: Diagnosis not present

## 2023-12-29 DIAGNOSIS — I10 Essential (primary) hypertension: Secondary | ICD-10-CM | POA: Diagnosis present

## 2023-12-29 DIAGNOSIS — F411 Generalized anxiety disorder: Secondary | ICD-10-CM | POA: Diagnosis present

## 2023-12-29 DIAGNOSIS — E785 Hyperlipidemia, unspecified: Secondary | ICD-10-CM | POA: Diagnosis present

## 2023-12-29 DIAGNOSIS — I4581 Long QT syndrome: Secondary | ICD-10-CM | POA: Diagnosis not present

## 2023-12-29 LAB — COMPREHENSIVE METABOLIC PANEL WITH GFR
ALT: 24 U/L (ref 0–44)
AST: 20 U/L (ref 15–41)
Albumin: 4.5 g/dL (ref 3.5–5.0)
Alkaline Phosphatase: 49 U/L (ref 38–126)
Anion gap: 11 (ref 5–15)
BUN: 15 mg/dL (ref 6–20)
CO2: 25 mmol/L (ref 22–32)
Calcium: 9.9 mg/dL (ref 8.9–10.3)
Chloride: 104 mmol/L (ref 98–111)
Creatinine, Ser: 0.8 mg/dL (ref 0.61–1.24)
GFR, Estimated: >60 mL/min (ref >60)
Glucose, Bld: 110 mg/dL — ABNORMAL HIGH (ref 70–99)
Potassium: 4.3 mmol/L (ref 3.5–5.1)
Sodium: 140 mmol/L (ref 135–145)
Total Bilirubin: 0.8 mg/dL (ref 0.0–1.2)
Total Protein: 7.5 g/dL (ref 6.5–8.1)

## 2023-12-29 LAB — CBC WITH DIFFERENTIAL/PLATELET
Abs Immature Granulocytes: 0.02 K/uL (ref 0.00–0.07)
Basophils Absolute: 0.1 K/uL (ref 0.0–0.1)
Basophils Relative: 2 %
Eosinophils Absolute: 0 K/uL (ref 0.0–0.5)
Eosinophils Relative: 1 %
HCT: 46.8 % (ref 39.0–52.0)
Hemoglobin: 16.2 g/dL (ref 13.0–17.0)
Immature Granulocytes: 0 %
Lymphocytes Relative: 56 %
Lymphs Abs: 3.1 K/uL (ref 0.7–4.0)
MCH: 29.8 pg (ref 26.0–34.0)
MCHC: 34.6 g/dL (ref 30.0–36.0)
MCV: 86.2 fL (ref 80.0–100.0)
Monocytes Absolute: 0.4 K/uL (ref 0.1–1.0)
Monocytes Relative: 6 %
Neutro Abs: 2 K/uL (ref 1.7–7.7)
Neutrophils Relative %: 35 %
Platelets: 430 K/uL — ABNORMAL HIGH (ref 150–400)
RBC: 5.43 MIL/uL (ref 4.22–5.81)
RDW: 12.6 % (ref 11.5–15.5)
WBC: 5.6 K/uL (ref 4.0–10.5)
nRBC: 0 % (ref 0.0–0.2)

## 2023-12-29 LAB — ETHANOL: Alcohol, Ethyl (B): <15 mg/dL (ref <15)

## 2023-12-29 LAB — LIPID PANEL
Cholesterol: 215 mg/dL — ABNORMAL HIGH (ref 0–200)
HDL: 56 mg/dL (ref >40)
LDL Cholesterol: 144 mg/dL — ABNORMAL HIGH (ref 0–99)
Total CHOL/HDL Ratio: 3.8 ratio
Triglycerides: 75 mg/dL (ref <150)
VLDL: 15 mg/dL (ref 0–40)

## 2023-12-29 LAB — POCT URINE DRUG SCREEN - MANUAL ENTRY (I-SCREEN)
POC Amphetamine UR: NOT DETECTED
POC Buprenorphine (BUP): NOT DETECTED
POC Cocaine UR: NOT DETECTED
POC Marijuana UR: NOT DETECTED
POC Methadone UR: NOT DETECTED
POC Methamphetamine UR: NOT DETECTED
POC Morphine: NOT DETECTED
POC Oxazepam (BZO): POSITIVE — AB
POC Oxycodone UR: NOT DETECTED
POC Secobarbital (BAR): NOT DETECTED

## 2023-12-29 LAB — HEMOGLOBIN A1C
Hgb A1c MFr Bld: 4.9 % (ref 4.8–5.6)
Mean Plasma Glucose: 93.93 mg/dL

## 2023-12-29 LAB — VITAMIN D 25 HYDROXY (VIT D DEFICIENCY, FRACTURES): Vit D, 25-Hydroxy: 48.16 ng/mL (ref 30–100)

## 2023-12-29 LAB — TSH: TSH: 1.563 u[IU]/mL (ref 0.350–4.500)

## 2023-12-29 MED ORDER — ROSUVASTATIN CALCIUM 20 MG PO TABS: 40.0000 mg | ORAL_TABLET | Freq: Every day | ORAL | Status: DC

## 2023-12-29 MED ORDER — ALUM & MAG HYDROXIDE-SIMETH 200-200-20 MG/5ML PO SUSP: 30.0000 mL | ORAL | Status: DC | PRN

## 2023-12-29 MED ORDER — ACETAMINOPHEN 325 MG PO TABS: 650.0000 mg | ORAL_TABLET | Freq: Four times a day (QID) | ORAL | Status: DC | PRN

## 2023-12-29 MED ORDER — HALOPERIDOL 5 MG PO TABS: 5.0000 mg | ORAL_TABLET | Freq: Three times a day (TID) | ORAL | Status: DC | PRN

## 2023-12-29 MED ORDER — MAGNESIUM HYDROXIDE 400 MG/5ML PO SUSP: 30.0000 mL | Freq: Every day | ORAL | Status: DC | PRN

## 2023-12-29 MED ORDER — LORAZEPAM 2 MG/ML IJ SOLN: 2.0000 mg | Freq: Three times a day (TID) | INTRAMUSCULAR | Status: DC | PRN

## 2023-12-29 MED ORDER — TRAZODONE HCL 50 MG PO TABS: 50.0000 mg | ORAL_TABLET | Freq: Every evening | ORAL | Status: DC | PRN

## 2023-12-29 MED ORDER — HALOPERIDOL LACTATE 5 MG/ML IJ SOLN: 10.0000 mg | Freq: Three times a day (TID) | INTRAMUSCULAR | Status: DC | PRN

## 2023-12-29 MED ORDER — HALOPERIDOL LACTATE 5 MG/ML IJ SOLN: 5.0000 mg | Freq: Three times a day (TID) | INTRAMUSCULAR | Status: DC | PRN

## 2023-12-29 MED ORDER — HYDROXYZINE HCL 25 MG PO TABS: 25.0000 mg | ORAL_TABLET | Freq: Three times a day (TID) | ORAL | Status: DC | PRN

## 2023-12-29 MED ORDER — ROSUVASTATIN CALCIUM 20 MG PO TABS
40.0000 mg | ORAL_TABLET | Freq: Every day | ORAL | Status: DC
  Administered 2023-12-30 – 2024-01-01 (×3): 40 mg via ORAL
  Filled 2023-12-29 (×3): qty 2

## 2023-12-29 MED ORDER — VITAMIN D (ERGOCALCIFEROL) 1.25 MG (50000 UNIT) PO CAPS
50000.0000 [IU] | ORAL_CAPSULE | ORAL | Status: DC
  Administered 2023-12-30: 50000 [IU] via ORAL
  Filled 2023-12-29: qty 1

## 2023-12-29 MED ORDER — HYDROXYZINE HCL 25 MG PO TABS
25.0000 mg | ORAL_TABLET | Freq: Three times a day (TID) | ORAL | Status: DC | PRN
  Administered 2023-12-29 – 2024-01-01 (×8): 25 mg via ORAL
  Filled 2023-12-29 (×8): qty 1

## 2023-12-29 MED ORDER — DIPHENHYDRAMINE HCL 50 MG/ML IJ SOLN: 50.0000 mg | Freq: Three times a day (TID) | INTRAMUSCULAR | Status: DC | PRN

## 2023-12-29 MED ORDER — TRAZODONE HCL 50 MG PO TABS
50.0000 mg | ORAL_TABLET | Freq: Every evening | ORAL | Status: DC | PRN
  Administered 2023-12-29 – 2023-12-31 (×3): 50 mg via ORAL
  Filled 2023-12-29 (×4): qty 1

## 2023-12-29 MED ORDER — HYDROXYZINE HCL 25 MG PO TABS
25.0000 mg | ORAL_TABLET | Freq: Once | ORAL | Status: AC
  Administered 2023-12-29: 25 mg via ORAL
  Filled 2023-12-29: qty 1

## 2023-12-29 MED ORDER — DIPHENHYDRAMINE HCL 25 MG PO CAPS: 50.0000 mg | ORAL_CAPSULE | Freq: Three times a day (TID) | ORAL | Status: DC | PRN

## 2023-12-29 MED ORDER — DIPHENHYDRAMINE HCL 50 MG PO CAPS: 50.0000 mg | ORAL_CAPSULE | Freq: Three times a day (TID) | ORAL | Status: DC | PRN

## 2023-12-29 MED ORDER — VITAMIN D (ERGOCALCIFEROL) 1.25 MG (50000 UNIT) PO CAPS: 50000.0000 [IU] | ORAL_CAPSULE | ORAL | Status: DC

## 2023-12-29 NOTE — Plan of Care (Signed)
 Nurse discussed anxiety, depression and coping skills with patient.

## 2023-12-29 NOTE — Telephone Encounter (Signed)
 Lvm to resch lab appt

## 2023-12-29 NOTE — ED Notes (Signed)
 Patient A&O x 4, ambulatory. Patient transferred to [facility] via safe transport.  Pt belongings given to transporter from locker #25. Safety maintained.

## 2023-12-29 NOTE — ED Notes (Signed)
 Pt admitted to [facility] due to increased anxiety and depression. Originally, pt endorsed SI with thoughts to cut his wrist but currently, denies SI. Pt report triggers are job related issues and in fear of losing his job. Calm, cooperative throughout interview process. Skin assessment completed. Oriented to unit. Meal and drink offered. At currrent, pt continue to endorse SI/HI/AVH. Pt verbally contract for safety. Will monitor for safety.

## 2023-12-29 NOTE — Discharge Instructions (Signed)
 Pt transferred to [facility] for inpatient treatment.

## 2023-12-29 NOTE — Progress Notes (Signed)
 Pt has been accepted to [facility] on 12/29/2023. Bed assignment:301-1   Pt meets inpatient criteria per Alan Mcardle, NP   Attending Physician will be Dr. Oliva Salmon  Report can be called to: Adult unit: [phone]  Pt can arrive after ASAP  Care Team Notified: [facility] Bretta Qua, RN, Lajuana Nett, RN, Alan Mcardle, NP

## 2023-12-29 NOTE — Progress Notes (Incomplete)
   12/29/23 2000  Psych Admission Type (Psych Patients Only)  Admission Status Voluntary  Psychosocial Assessment  Patient Complaints Anxiety  Eye Contact Fair  Facial Expression Anxious  Affect Anxious  Speech Logical/coherent  Interaction Assertive  Motor Activity Other (Comment) (WDL)  Appearance/Hygiene Unremarkable  Behavior Characteristics Cooperative;Anxious  Mood Depressed  Aggressive Behavior  Effect No apparent injury  Thought Process  Coherency WDL  Content WDL  Delusions None reported or observed  Perception WDL  Hallucination None reported or observed  Judgment WDL  Confusion None  Danger to Self  Current suicidal ideation? Denies  Agreement Not to Harm Self Yes   .(Sleep Hours) -4.75 (Any PRNs that were needed, meds refused, or side effects to meds)- Atarax  and trazodone   (Any disturbances and when (visitation, over night)-no disturbances (Concerns raised by the patient)- none (SI/HI/AVH)-denied

## 2023-12-29 NOTE — Telephone Encounter (Signed)
 I did not reach out to patient reasoning: Patient is admitted to [facility] today. Dr. Randeen suggested that he go yesterday. It shows he went today

## 2023-12-29 NOTE — Tx Team (Signed)
 Initial Treatment Plan 12/29/2023 6:49 PM Alejandro [name] FMW:[id number]    PATIENT STRESSORS: Financial difficulties   Health problems   Occupational concerns   Traumatic event     PATIENT STRENGTHS: Ability for insight  Active sense of humor  Average or above average intelligence  Capable of independent living  Communication skills  Financial means  General fund of knowledge  Motivation for treatment/growth  Physical Health  Supportive family/friends    PATIENT IDENTIFIED PROBLEMS: Anxiety   Depression  Panic attacks                 DISCHARGE CRITERIA:  Ability to meet basic life and health needs Adequate post-discharge living arrangements Improved stabilization in mood, thinking, and/or behavior Medical problems require only outpatient monitoring Motivation to continue treatment in a less acute level of care Need for constant or close observation no longer present Reduction of life-threatening or endangering symptoms to within safe limits Safe-care adequate arrangements made Verbal commitment to aftercare and medication compliance  PRELIMINARY DISCHARGE PLAN: Attend aftercare/continuing care group Attend PHP/IOP Outpatient therapy Participate in family therapy Placement in alternative living arrangements Return to previous living arrangement  PATIENT/FAMILY INVOLVEMENT: This treatment plan has been presented to and reviewed with the patient, Alejandro [name]..  The patient and family have been given the opportunity to ask questions and make suggestions.  [name], [place] 12/29/2023, 6:49 PM
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
## 2023-12-29 NOTE — Plan of Care (Signed)

## 2023-12-29 NOTE — ED Notes (Signed)
 I sent put out Blood samples for Dash to pick up,two & a half yellow, one red,light green, dark green, two lavenders.
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
## 2023-12-29 NOTE — Progress Notes (Signed)
   12/29/23 0826  [facility] Triage Screening (Walk-ins at [facility] only)  How Did You Hear About Us ? Primary Care  What Is the Reason for Your Visit/Call Today? Patient presents to the [facility] referred by his PCP.  Patient states that he lost his job in December 2023 right before Christmas and has been at his new job for approximately one year.  Patient states that the company he works for is not good and he was recently written up.  He states that he is under a great deal of stress, has severe anxiety and has been experiencing anxiety attacks. Patient states that he feels like he is on a downward spiral.  Patient states that he feels like a failure and states that he feels like he is letting his family down. Patient states that he has experienced suicidal thoughts and thoughts of cutting his wrists, but states that he would never do that to his family or his kids.  He states that it just have been random thoughts that they might be better off if he was not here anymore.  He feels like he is a burden to them.  Patient states that he went to his PCP for medications and a referral for counseling and states that she insisted that he come here.  Patient states that he has never made any attempts to end his life in the past.  He denies HI/Psychosis and denies any drug or alcohol use.  Patient states that he has been experiencing ruminating thoughts and anxiety at night and states that he has been experiencing sleep disturbance.  He states that his wife gave him an alprazolam  pill last night and it helped.  He states that he has also experienced a decrease in appetite. He denies any history of abuse.  Patient is urgent due to suicidal thoughts.  How Long Has This Been Causing You Problems? 1-6 months  Have You Recently Had Any Thoughts About Hurting Yourself? Yes  How long ago did you have thoughts about hurting yourself? yesterday, but states that he would not do it  Are You Planning to Commit Suicide/Harm Yourself At This  time? No  Have you Recently Had Thoughts About Hurting Someone Sherral? No  Are You Planning To Harm Someone At This Time? No  Physical Abuse Denies  Verbal Abuse Denies  Sexual Abuse Denies  Exploitation of patient/patient's resources Denies  Self-Neglect Denies  Possible abuse reported to: Other (Comment) (NA)  Are you currently experiencing any auditory, visual or other hallucinations? No  Have You Used Any Alcohol or Drugs in the Past 24 Hours? No  Do you have any current medical co-morbidities that require immediate attention? No  Clinician description of patient physical appearance/behavior: clean, neat and casually dressed  What Do You Feel Would Help You the Most Today? Treatment for Depression or other mood problem  If access to [facility] was not available, would you have sought care in the Emergency Department? No  Determination of Need Urgent (48 hours)  Options For Referral Medication Management;Facility-Based Crisis;BH Urgent Care;Outpatient Therapy;Inpatient Hospitalization  Determination of Need filed? Yes
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
## 2023-12-29 NOTE — Group Note (Signed)
 Date:  12/29/2023 Time:  10:49 PM  Group Topic/Focus:  Wrap-Up Group:   The focus of this group is to help patients review their daily goal of treatment and discuss progress on daily workbooks.    Participation Level:  Active  Participation Quality:  Attentive  Affect:  Appropriate  Cognitive:  Appropriate  Insight: Good  Engagement in Group:  Engaged  Modes of Intervention:  Support  Additional Comments:  Client attended group and participated effectively. Patient was reserved but was open to sharing his thoughts during group. Patient described that he was overwhelmed from the day. His win of the day was that he sought help instead of avoiding his problems. He looks forward to prioritzing rest tomorrow!  Dena JINNY Mace 12/29/2023, 10:49 PM

## 2023-12-29 NOTE — Telephone Encounter (Addendum)
 Noted.  Thanks.  Will await that follow-up note.

## 2023-12-29 NOTE — ED Notes (Signed)
 Pt accepted to [facility]. Nurse to nurse report given to [facility], [place]. Safe transport called for transport. Pt made aware and consent to transfer. Safety maintained.

## 2023-12-30 ENCOUNTER — Telehealth: Payer: Self-pay | Admitting: *Deleted

## 2023-12-30 ENCOUNTER — Encounter (HOSPITAL_COMMUNITY): Payer: Self-pay

## 2023-12-30 MED ORDER — ESCITALOPRAM OXALATE 5 MG PO TABS
5.0000 mg | ORAL_TABLET | Freq: Every day | ORAL | Status: DC
  Administered 2023-12-30 – 2023-12-31 (×2): 5 mg via ORAL
  Filled 2023-12-30 (×2): qty 1

## 2023-12-30 MED ORDER — WHITE PETROLATUM EX OINT
TOPICAL_OINTMENT | CUTANEOUS | Status: AC
  Administered 2023-12-30: 1
  Filled 2023-12-30: qty 5

## 2023-12-30 NOTE — Telephone Encounter (Signed)
 Copied from CRM [id number]. Topic: Clinical - Medical Advice >> Dec 30, 2023 11:03 AM Thersia BROCKS wrote: Patient wife , Lauraine called in wanting Dr.Tower or Dr.Duncan to give her a callback as soon as possible as she stated this is urgent  [phone]

## 2023-12-30 NOTE — Group Note (Signed)
 Date:  12/30/2023 Time:  9:41 AM  Group Topic/Focus:  Goals Group/Stages of change:   The focus of this group is to help patients establish daily goals to achieve during treatment and discuss how the patient can incorporate goal setting into their daily lives to aide in recovery. Also discussed  the stages of change they were in and how ready for change they are and what possible steps they could take to changing in the future     Participation Level:  Active  Participation Quality:  Appropriate  Affect:  Appropriate  Cognitive:  Appropriate  Insight: Appropriate  Engagement in Group:  Engaged  Modes of Intervention:  Activity and Discussion  Additional Comments:    Alejandro [name] 12/30/2023, 9:41 AM

## 2023-12-30 NOTE — Telephone Encounter (Signed)
 Copied from CRM [id number]. Topic: Clinical - Medical Advice >> Dec 30, 2023  8:09 AM Thersia BROCKS wrote: Reason for CRM: Patient wife called in regarding patients admission to the behavioral hospital, would like for Dr.Tower to give her a call regarding some outpatient services  [phone]

## 2023-12-30 NOTE — Telephone Encounter (Signed)
 Usually PCP manages f/u services, will route to Dr. Randeen but also PCP
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
## 2023-12-30 NOTE — Plan of Care (Signed)

## 2023-12-30 NOTE — Group Note (Signed)
 Date:  12/30/2023 Time:  11:31 PM  Group Topic/Focus:  Relapse Prevention Planning:   The focus of this group is to define relapse and discuss the need for planning to combat relapse.  Alcoholics Anonymous   Participation Level:  Active  Participation Quality:  Appropriate  Affect:  Appropriate  Cognitive:  Appropriate  Insight: Appropriate  Engagement in Group:  Supportive  Modes of Intervention:  Support  Additional Comments:  Patient is attentive and actively engages in group. Patient limits interactions with others but remains polite and reserved. Patient has been encouraged to socialize.  Dena JINNY Mace 12/30/2023, 11:31 PM

## 2023-12-30 NOTE — Group Note (Signed)
 Date:  12/30/2023 Time:  2:11 PM  Group Topic/Focus:  Questionnaire Ball The purpose of this group is to pass the questionnaire ball around the room and do the the soothing activities for 10 seconds.     Participation Level:  Active  Participation Quality:  Appropriate  Affect:  Appropriate  Cognitive:  Appropriate  Insight: Appropriate  Engagement in Group:  Engaged  Modes of Intervention:  Activity  Additional Comments:    [name] 12/30/2023, 2:11 PM

## 2023-12-30 NOTE — H&P (Signed)
 Psychiatric Admission Assessment Adult  Patient Identification: Alejandro [name] MRN:  [id number] Date of Evaluation:  12/30/2023 Chief Complaint:  MDD (major depressive disorder), recurrent episode, severe (HCC) [F33.2] Principal Diagnosis: MDD (major depressive disorder), recurrent episode, severe (HCC) Diagnosis:  Principal Problem:   MDD (major depressive disorder), recurrent episode, severe (HCC)  CC: Work stress.  History of Present Illness: Alejandro [name] is a 43 year old Caucasian male with prior psychiatric history significant for MDD recurrent episodes of severe, generalized anxiety disorder, and panic disorder who presents voluntarily to [facility] from [facility] behavioral health urgent care for worsening depression and anxiety resulting in suicidal ideation in the context of psychosocial stressors and work stressors.  UDS positive for benzodiazepine, which patient reports Xanax  was being given to him by his wife for anxiety.  During today's assessment, patient reports, I have a lot of stress at work and also at home due to being the only breadwinner in the house for my wife and my 2 children.  Sometimes I feel panicky when I am overwhelmed.  I went to my primary care physician and he told me to go to the [facility] behavioral health urgent care to get help for my depression and anxiety.  He also recommend that I quit my job due to my previous heart attack and distress of this job on my heart.  I am currently working in a job that is very terrible and putting in 56 to 60 hours a week.  Even then my boss told me that I am not working enough hours and wants me to come in, on the weekend to put in more hours.  I am absolutely terrified of my Boss and I just feel very defeated.  I can recall the time I have panic attacks while at work, and I am just continuing because I have to provide from my wife and 2 children.  Reports depressive symptoms to include  sadness, crying, anhedonia, irritability, guilt, worthlessness, hopelessness, decreased appetite, suicidal ideation.  He reports some anxiety symptoms that include increased worrying, chest tightness, decreased sleep and body tension, hyperventilation, sweating, GI upset, and heart palpitation.  Patient denies symptoms of psychosis, PTSD, OCD, or mania.  Denies drug use, alcohol use, smoking, marijuana use or vaping.  He reports living in a home with his wife and 2 children.  Objective: Patient presents alert, calm, cooperative, and oriented to person, place, time, and situation.  Chart reviewed and findings shared with the treatment team and consulted attending psychiatrist, with recommendation to initiate Lexapro  tablet 5 mg p.o. daily for depression and anxiety and to titrate the 10 mg p.o. daily starting 12/31/2023.  Speech is clear and coherent with normal volume and pattern.  Thought process and thought content coherent, logical, and organized.  Objectively, not responding to internal stimuli nor appears to be preoccupied.  He denies SI, HI, or AVH.  He further denies suicidal intent, plans, or prior suicide attempt.  He is able to contract for safety while in the hospital.  Vital signs reviewed without critical values.  Labs and EKG reviewed as indicated in the treatment plan.  Patient is admitted for safety, medication management, and stabilization.  Mode of transport to Hospital: Current Outpatient (Home) Medication List: See medication list PRN medication prior to evaluation: See home medication listing  ED course: Labs and EKG were obtained and analyzed Collateral Information: Not obtained at this time POA/Legal Guardian: Patient is his own legal guardian  Past Psychiatric  Hx: Previous Psych Diagnoses: MDD without psychotic features, generalized anxiety disorder Prior inpatient treatment: Denies Current/prior outpatient treatment: Reports seeing a therapist 2 years ago Prior rehab hx:  Denies Psychotherapy hx: Years History of suicide: Denies History of homicide or aggression: Denies Psychiatric medication history: Before being on trial of Lexapro , and viibryd  2 years ago Neuromodulation history: He denies Current Psychiatrist: Denies Current therapist: Denies  Substance Abuse Hx: Alcohol: Denies Tobacco: Denies Illicit drugs: Denies Rx drug abuse: Denies Rehab hx: Denies  Past Medical History: Medical Diagnoses: Hyperlipidemia, Takosubo syndrome, GERD, back pain Home Rx: Yes Prior Hosp: Denies Prior Surgeries/Trauma: Head trauma, LOC, concussions, seizures: Denies Allergies: No known drug allergies LMP: Not applicable Contraception: Not applicable PCP: Yes  Family History: Medical: Denies Psych: Denies Psych Rx: Denies SA/HA: Denies Substance use family hx: Denies  Social History: Childhood (bring, raised, lives now, parents, siblings, schooling, education): Some college Abuse: Denies history of abuse Marital Status: Married with 2 children Sexual orientation: Male from birth Children: 2 Employment: Employed at a [employer], [employer]: Denies peer group Housing: Has his own housing Finances: No financial difficulties Legal: Denies Scientist, physiological: Denies affiliation with the [employer] Associated Signs/Symptoms: Depression Symptoms:  depressed mood, anhedonia, fatigue, feelings of worthlessness/guilt, difficulty concentrating, hopelessness, suicidal thoughts without plan, anxiety, loss of energy/fatigue, disturbed sleep, (Hypo) Manic Symptoms:  Irritable Mood, Anxiety Symptoms:  Excessive Worry, Psychotic Symptoms:  N/A PTSD Symptoms: NA Total Time spent with patient: 1.5 hours  Is the patient at risk to self? Yes.    Has the patient been a risk to self in the past 6 months? Yes.    Has the patient been a risk to self within the distant past? No.  Is the patient a risk to others? No.  Has the patient been  a risk to others in the past 6 months? No.  Has the patient been a risk to others within the distant past? No.   [place] Scale:  Flowsheet Row [facility] Most recent reading at 12/29/2023  3:23 PM ED from 12/29/2023 in [facility] Most recent reading at 12/29/2023 10:10 AM  C-SSRS RISK CATEGORY Moderate Risk Moderate Risk   Alcohol Screening: 1. How often do you have a drink containing alcohol?: Never 2. How many drinks containing alcohol do you have on a typical day when you are drinking?: 1 or 2 3. How often do you have six or more drinks on one occasion?: Never AUDIT-C Score: 0 4. How often during the last year have you found that you were not able to stop drinking once you had started?: Never 5. How often during the last year have you failed to do what was normally expected from you because of drinking?: Never 6. How often during the last year have you needed a first drink in the morning to get yourself going after a heavy drinking session?: Never 7. How often during the last year have you had a feeling of guilt of remorse after drinking?: Never 8. How often during the last year have you been unable to remember what happened the night before because you had been drinking?: Never 9. Have you or someone else been injured as a result of your drinking?: No 10. Has a relative or friend or a doctor or another health worker been concerned about your drinking or suggested you cut down?: No Alcohol Use Disorder Identification Test Final Score (AUDIT): 0 Alcohol  Brief Interventions/Follow-up: Alcohol education/Brief advice  Substance Abuse History in the last 12 months:  No. Consequences of Substance Abuse: NA Previous Psychotropic Medications: Yes  Psychological Evaluations: Yes  Past Medical History:  Past Medical History:  Diagnosis Date   ADD (attention deficit disorder)    Alcohol abuse     Anxiety    Back pain    Bursitis of both knees    Degenerative disc disease, lumbar    Depression    Drug use    Fatty liver    Floppy eyelid syndrome    GERD (gastroesophageal reflux disease)    Hyperlipidemia    Hypertension    Obesity    Plantar fasciitis    followed by ortho   Sleep apnea    SOB (shortness of breath)     Past Surgical History:  Procedure Laterality Date   US  ECHOCARDIOGRAPHY  08/2010   normal, no LVH   Family History:  Family History  Problem Relation Age of Onset   Anxiety disorder Mother    Alcohol abuse Mother    Diabetes Mother    Depression Mother    Sleep apnea Mother    Alcoholism Mother    Obesity Mother    ADD / ADHD Father    OCD Father    Hyperlipidemia Father    Anxiety disorder Father    Hypertension Maternal Grandfather    CAD Maternal Grandfather 30       MI, CABG   CAD Paternal Grandfather 80   Depression Paternal Grandmother    Anxiety disorder Paternal Grandmother    Colon cancer Neg Hx    Prostate cancer Neg Hx    Tobacco Screening:  Social History   Tobacco Use  Smoking Status Never  Smokeless Tobacco Never    BH Tobacco Counseling     Are you interested in Tobacco Cessation Medications?  No value filed. Counseled patient on smoking cessation:  No value filed. Reason Tobacco Screening Not Completed: No value filed.    Social History:  Social History   Substance and Sexual Activity  Alcohol Use No     Social History   Substance and Sexual Activity  Drug Use No    Additional Social History: Marital status: Married Number of Years Married: 14 What types of issues is patient dealing with in the relationship?: The patient stated none. Additional relationship information: The patient stated wife is supportive. Are you sexually active?: Yes Does patient have children?: Yes How many children?: 2 How is patient's relationship with their children?: The patient stated great.    Allergies:  No Known  Allergies Lab Results:  Results for orders placed or performed during the hospital encounter of 12/29/23 (from the past 48 hours)  CBC with Differential/Platelet     Status: Abnormal   Collection Time: 12/29/23 10:03 AM  Result Value Ref Range   WBC 5.6 4.0 - 10.5 K/uL   RBC 5.43 4.22 - 5.81 MIL/uL   Hemoglobin 16.2 13.0 - 17.0 g/dL   HCT 53.1 60.9 - 47.9 %   MCV 86.2 80.0 - 100.0 fL   MCH 29.8 26.0 - 34.0 pg   MCHC 34.6 30.0 - 36.0 g/dL   RDW 87.3 88.4 - 84.4 %   Platelets 430 (H) 150 - 400 K/uL   nRBC 0.0 0.0 - 0.2 %   Neutrophils Relative % 35 %   Neutro Abs 2.0 1.7 - 7.7 K/uL   Lymphocytes Relative 56 %   Lymphs Abs 3.1 0.7 -  4.0 K/uL   Monocytes Relative 6 %   Monocytes Absolute 0.4 0.1 - 1.0 K/uL   Eosinophils Relative 1 %   Eosinophils Absolute 0.0 0.0 - 0.5 K/uL   Basophils Relative 2 %   Basophils Absolute 0.1 0.0 - 0.1 K/uL   Immature Granulocytes 0 %   Abs Immature Granulocytes 0.02 0.00 - 0.07 K/uL    Comment: Performed at [facility], [address] [place], [place] [address]  Comprehensive metabolic panel     Status: Abnormal   Collection Time: 12/29/23 10:03 AM  Result Value Ref Range   Sodium 140 135 - 145 mmol/L   Potassium 4.3 3.5 - 5.1 mmol/L   Chloride 104 98 - 111 mmol/L   CO2 25 22 - 32 mmol/L   Glucose, Bld 110 (H) 70 - 99 mg/dL    Comment: Glucose reference range applies only to samples taken after fasting for at least 8 hours.   BUN 15 6 - 20 mg/dL   Creatinine, Ser 9.19 0.61 - 1.24 mg/dL   Calcium  9.9 8.9 - 10.3 mg/dL   Total Protein 7.5 6.5 - 8.1 g/dL   Albumin 4.5 3.5 - 5.0 g/dL   AST 20 15 - 41 U/L   ALT 24 0 - 44 U/L   Alkaline Phosphatase 49 38 - 126 U/L   Total Bilirubin 0.8 0.0 - 1.2 mg/dL   GFR, Estimated >39 >39 mL/min    Comment: (NOTE) Calculated using the CKD-EPI Creatinine Equation (2021)    Anion gap 11 5 - 15    Comment: Performed at [facility], [address] [place], [place] [address]  Hemoglobin A1c      Status: None   Collection Time: 12/29/23 10:03 AM  Result Value Ref Range   Hgb A1c MFr Bld 4.9 4.8 - 5.6 %    Comment: (NOTE) Diagnosis of Diabetes The following HbA1c ranges recommended by the American Diabetes Association (ADA) may be used as an aid in the diagnosis of diabetes mellitus.  Hemoglobin             Suggested A1C NGSP%              Diagnosis  <5.7                   Non Diabetic  5.7-6.4                Pre-Diabetic  >6.4                   Diabetic  <7.0                   Glycemic control for                       adults with diabetes.     Mean Plasma Glucose 93.93 mg/dL    Comment: Performed at [facility], [address], [place], [place] [address]  Ethanol     Status: None   Collection Time: 12/29/23 10:03 AM  Result Value Ref Range   Alcohol, Ethyl (B) <15 <15 mg/dL    Comment: (NOTE) For medical purposes only. Performed at [facility], [address], [place], [place] [address]   Lipid panel     Status: Abnormal   Collection Time: 12/29/23 10:03 AM  Result Value Ref Range   Cholesterol 215 (H) 0 - 200 mg/dL   Triglycerides 75 <849  mg/dL   HDL 56 >59 mg/dL   Total CHOL/HDL Ratio 3.8 RATIO   VLDL 15 0 - 40 mg/dL   LDL Cholesterol 855 (H) 0 - 99 mg/dL    Comment:        Total Cholesterol/HDL:CHD Risk Coronary Heart Disease Risk Table                     Men   Women  1/2 Average Risk   3.4   3.3  Average Risk       5.0   4.4  2 X Average Risk   9.6   7.1  3 X Average Risk  23.4   11.0        Use the calculated Patient Ratio above and the CHD Risk Table to determine the patient's CHD Risk.        ATP III CLASSIFICATION (LDL):  <100     mg/dL   Optimal  899-870  mg/dL   Near or Above                    Optimal  130-159  mg/dL   Borderline  839-810  mg/dL   High  >809     mg/dL   Very High Performed at [facility], [address] [place], [place] [address]   TSH     Status: None   Collection Time: 12/29/23 10:03 AM   Result Value Ref Range   TSH 1.563 0.350 - 4.500 uIU/mL    Comment: Performed by a 3rd Generation assay with a functional sensitivity of <=0.01 uIU/mL. Performed at [facility], [address] [place], [place] [address]   VITAMIN D  25 Hydroxy (Vit-D Deficiency, Fractures)     Status: None   Collection Time: 12/29/23 10:03 AM  Result Value Ref Range   Vit D, 25-Hydroxy 48.16 30 - 100 ng/mL    Comment: (NOTE) Vitamin D  deficiency has been defined by the Institute of Medicine  and an Endocrine Society practice guideline as a level of serum 25-OH  vitamin D  less than 20 ng/mL (1,2). The Endocrine Society went on to  further define vitamin D  insufficiency as a level between 21 and 29  ng/mL (2).  1. IOM (Institute of Medicine). 2010. Dietary reference intakes for  calcium  and D. [place]: [employer]. 2. Holick MF, Binkley [place], Bischoff-Ferrari HA, et al. Evaluation,  treatment, and prevention of vitamin D  deficiency: an Endocrine  Society clinical practice guideline, JCEM. 2011 Jul; 96(7): 1911-30.  Performed at [facility], [address], [place], [place] [address]   POCT Urine Drug Screen - (I-Screen)     Status: Abnormal   Collection Time: 12/29/23 10:10 AM  Result Value Ref Range   POC Amphetamine UR None Detected NONE DETECTED (Cut Off Level 1000 ng/mL)   POC Secobarbital (BAR) None Detected NONE DETECTED (Cut Off Level 300 ng/mL)   POC Buprenorphine (BUP) None Detected NONE DETECTED (Cut Off Level 10 ng/mL)   POC Oxazepam (BZO) Positive (A) NONE DETECTED (Cut Off Level 300 ng/mL)   POC Cocaine UR None Detected NONE DETECTED (Cut Off Level 300 ng/mL)   POC Methamphetamine UR None Detected NONE DETECTED (Cut Off Level 1000 ng/mL)   POC Morphine  None Detected NONE DETECTED (Cut Off Level 300 ng/mL)   POC Methadone UR None Detected NONE DETECTED (Cut Off Level 300 ng/mL)   POC Oxycodone UR None Detected NONE DETECTED (Cut Off  Level 100 ng/mL)    POC Marijuana UR None Detected NONE DETECTED (Cut Off Level 50 ng/mL)   Blood Alcohol level:  Lab Results  Component Value Date   [facility] <15 12/29/2023   Metabolic Disorder Labs:  Lab Results  Component Value Date   HGBA1C 4.9 12/29/2023   MPG 93.93 12/29/2023   MPG 103 07/07/2021   No results found for: PROLACTIN Lab Results  Component Value Date   CHOL 215 (H) 12/29/2023   TRIG 75 12/29/2023   HDL 56 12/29/2023   CHOLHDL 3.8 12/29/2023   VLDL 15 12/29/2023   LDLCALC 144 (H) 12/29/2023   LDLCALC 162 (H) 09/01/2023   Current Medications: Current Facility-Administered Medications  Medication Dose Route Frequency Provider Last Rate Last Admin   acetaminophen  (TYLENOL ) tablet 650 mg  650 mg Oral Q6H PRN Brent, Amanda C, NP       alum & mag hydroxide-simeth (MAALOX/MYLANTA) 200-200-20 MG/5ML suspension 30 mL  30 mL Oral Q4H PRN Brent, Amanda C, NP       haloperidol  (HALDOL ) tablet 5 mg  5 mg Oral TID PRN Brent, Amanda C, NP       And   diphenhydrAMINE  (BENADRYL ) capsule 50 mg  50 mg Oral TID PRN Brent, Amanda C, NP       haloperidol  lactate (HALDOL ) injection 5 mg  5 mg Intramuscular TID PRN Thresa Alan BROCKS, NP       And   diphenhydrAMINE  (BENADRYL ) injection 50 mg  50 mg Intramuscular TID PRN Thresa Alan BROCKS, NP       And   LORazepam  (ATIVAN ) injection 2 mg  2 mg Intramuscular TID PRN Brent, Amanda C, NP       haloperidol  lactate (HALDOL ) injection 10 mg  10 mg Intramuscular TID PRN Thresa Alan BROCKS, NP       And   diphenhydrAMINE  (BENADRYL ) injection 50 mg  50 mg Intramuscular TID PRN Brent, Amanda C, NP       And   LORazepam  (ATIVAN ) injection 2 mg  2 mg Intramuscular TID PRN Brent, Amanda C, NP       hydrOXYzine  (ATARAX ) tablet 25 mg  25 mg Oral TID PRN Brent, Amanda C, NP   25 mg at 12/30/23 1150   magnesium  hydroxide (MILK OF MAGNESIA) suspension 30 mL  30 mL Oral Daily PRN Brent, Amanda C, NP       rosuvastatin  (CRESTOR ) tablet 40 mg  40 mg Oral Daily Brent, Amanda C,  NP   40 mg at 12/30/23 0806   traZODone  (DESYREL ) tablet 50 mg  50 mg Oral QHS PRN Brent, Amanda C, NP   50 mg at 12/29/23 2103   Vitamin D  (Ergocalciferol ) (DRISDOL ) 1.25 MG (50000 UNIT) capsule 50,000 Units  50,000 Units Oral Q7 days Thresa Alan BROCKS, NP   50,000 Units at 12/30/23 1117   PTA Medications: Medications Prior to Admission  Medication Sig Dispense Refill Last Dose/Taking   meclizine  (ANTIVERT ) 25 MG tablet Take 1 tablet (25 mg total) by mouth 2 (two) times daily as needed for dizziness. (Patient not taking: Reported on 12/29/2023) 30 tablet 0    rosuvastatin  (CRESTOR ) 40 MG tablet Take 1 tablet (40 mg total) by mouth daily. 90 tablet 0    tirzepatide  (ZEPBOUND ) 12.5 MG/0.5ML Pen Inject 12.5 mg into the skin once a week. (Patient not taking: Reported on 12/29/2023) 6 mL 2    Vitamin D , Ergocalciferol , (DRISDOL ) 1.25 MG (50000 UNIT) CAPS capsule Take 1 capsule (50,000 Units total)  by mouth every 7 (seven) days. 5 capsule 0    AIMS:  ,  ,  ,  ,  ,  ,    Musculoskeletal: Strength & Muscle Tone: within normal limits Gait & Station: normal Patient leans: N/A  Psychiatric Specialty Exam:  Presentation  General Appearance:  Appropriate for Environment; Casual; Fairly Groomed  Eye Contact: Good  Speech: Clear and Coherent; Normal Rate  Speech Volume: Normal  Handedness: Right  Mood and Affect  Mood: Depressed; Anxious  Affect: Appropriate; Congruent  Thought Process  Thought Processes: Coherent; Goal Directed  Duration of Psychotic Symptoms:N/A Past Diagnosis of Schizophrenia or Psychoactive disorder: No  Descriptions of Associations:Intact  Orientation:Full (Time, Place and Person)  Thought Content:Logical  Hallucinations:Hallucinations: None  Ideas of Reference:None  Suicidal Thoughts:Suicidal Thoughts: No SI Active Intent and/or Plan: -- (Denies) SI Passive Intent and/or Plan: -- (Denies)  Homicidal Thoughts:Homicidal Thoughts: No  Sensorium   Memory: Immediate Good; Recent Good  Judgment: Good  Insight: Good  Executive Functions  Concentration: Good  Attention Span: Good  Recall: Good  Fund of Knowledge: Good  Language: Good  Psychomotor Activity  Psychomotor Activity: Psychomotor Activity: Normal  Assets  Assets: Communication Skills; Desire for Improvement; Physical Health; Social Support  Sleep  Sleep: Sleep: Good  Estimated Sleeping Duration (Last 24 Hours): 3.50-5.00 hours  Physical Exam: Physical Exam Vitals and nursing note reviewed.  Constitutional:      General: He is not in acute distress.    Appearance: He is normal weight. He is not ill-appearing.  HENT:     Head: Normocephalic.     Right Ear: External ear normal.     Left Ear: External ear normal.     Nose: Nose normal.     Mouth/Throat:     Mouth: Mucous membranes are moist.     Pharynx: Oropharynx is clear.  Eyes:     Extraocular Movements: Extraocular movements intact.  Cardiovascular:     Rate and Rhythm: Normal rate.     Pulses: Normal pulses.  Pulmonary:     Effort: Pulmonary effort is normal.  Abdominal:     Comments: Deferred  Genitourinary:    Comments: Deferred Musculoskeletal:        General: Normal range of motion.     Cervical back: Normal range of motion.  Skin:    General: Skin is warm.  Neurological:     General: No focal deficit present.     Mental Status: He is alert and oriented to person, place, and time.  Psychiatric:        Mood and Affect: Mood normal.        Behavior: Behavior normal.        Thought Content: Thought content normal.    Review of Systems  Constitutional:  Negative for chills and fever.  HENT:  Negative for sore throat.   Eyes:  Negative for blurred vision.  Respiratory:  Negative for cough, sputum production, shortness of breath and wheezing.   Cardiovascular:  Negative for chest pain and palpitations.  Gastrointestinal:  Negative for abdominal pain, constipation,  diarrhea, heartburn, nausea and vomiting.  Genitourinary:  Negative for dysuria.  Musculoskeletal:  Negative for falls.  Skin:  Negative for itching and rash.  Neurological:  Negative for dizziness and headaches.  Endo/Heme/Allergies:        See allergy listing  Psychiatric/Behavioral:  Positive for depression. Negative for hallucinations, substance abuse and suicidal ideas. The patient is nervous/anxious. The patient does not have insomnia.  Blood pressure 116/80, pulse 84, temperature 97.7 F (36.5 C), temperature source Oral, resp. rate 18, height 5' 7.5 (1.715 m), weight 93.4 kg, SpO2 97%. Body mass index is 31.79 kg/m.  Treatment Plan Summary: Daily contact with patient to assess and evaluate symptoms and progress in treatment and Medication management Assessment:  Alejandro [name] is a 43 year old Caucasian male with prior psychiatric history significant for MDD recurrent episodes of severe, generalized anxiety disorder, and panic disorder who presents voluntarily to [facility] from [facility] behavioral health urgent care for worsening depression and anxiety resulting in suicidal ideation in the context of psychosocial stressors and work stressors.  UDS positive for benzodiazepine, which patient reports Xanax  was being given to him by his wife for anxiety.  Physician Treatment Plan for Primary Diagnosis:  MDD (major depressive disorder), recurrent episode, severe (HCC)  Plans: Medications: --Lexapro  tablet 5 mg p.o. daily for depression and anxiety.  May titrate 10 mg p.o. daily by 12/31/2023. --Hydroxyzine  tablets 25 mg p.o. 3 times daily as needed for anxiety --Trazodone  tablets 50 mg p.o. q. nightly as needed for insomnia  Medications for other medical problems: -- Continue Crestor  tablet 40 mg p.o. daily for hyperlipidemia --Vitamin D  capsule 50,000 international units p.o. q. 7 days for vitamin D  deficiency  Other PRN Medications   -Acetaminophen  650 mg every 6 as needed/mild pain  -Maalox 30 mL oral every 4 as needed/digestion  -Magnesium  hydroxide 30 mL daily as needed/mild constipation   --The risks/benefits/side-effects/alternatives to this medication were discussed in detail with the patient and time was given for questions. The patient consents to medication trial.   -- Metabolic profile and EKG monitoring obtained while on an atypical antipsychotic (BMI: Lipid Panel: HbgA1c: QTc:)   -- Encouraged patient to participate in unit milieu and in scheduled group therapies   Continue BH Agitation Protocol  --Haldol  5 mg, oral, 3 times daily as needed, mild agitation  --Benadryl  50 mg, oral, 3 times daily as needed, mild agitation                                      OR   --Haldol  injection 5 mg, IM, 3 times daily as needed, moderate agitation  --Benadryl  injection 50 mg, IM, 3 times daily as needed, moderate agitation  --Ativan  injection 2 mg, IM, 3 times daily as needed, moderate agitation                                      OR  --Haldol  injection 10 mg, IM, 3 times daily as needed, severe agitation  --Benadryl  injection 50 mg, IM, 3 times daily as needed, severe agitation  --Ativan  injection 2 mg, IM, 3 times daily as needed, severe agitation   Admission labs reviewed: CMP: Glucose 110, otherwise normal.  Lipid profile: Cholesterol 215, LDL 144.  Otherwise normal.  CBC with differential: Platelet 430, otherwise normal.  Hemoglobin A1c 4.9, normal.  TSH: 1.563, normal.  UDS: Positive for benzodiazepine.  BAL: Less than 15.  New labs ordered: None  EKG revealed: Normal sinus rhythm, ventricular rate 83, QT/QTc 364/427  Safety and Monitoring:  Voluntary admission to inpatient psychiatric unit for safety, stabilization and treatment  Daily contact with patient to assess and evaluate symptoms and progress in treatment  Patient's case to be discussed in  multi-disciplinary team meeting   Observation Level : q15  minute checks  Vital signs: q12 hours  Precautions: suicide, but pt currently verbally contracts for safety on unit?   Discharge Planning:  Social work and case management to assist with discharge planning and identification of hospital follow-up needs prior to discharge  Estimated LOS: 5-7?days  Discharge Concerns: Need to establish a safety plan; Medication compliance and effectiveness   Discharge Goals:  Return home with outpatient referrals for mental health follow-up including medication management/psychotherapy.   Long Term Goal(s): Improvement in symptoms so as ready for discharge  Short Term Goals: Ability to identify changes in lifestyle to reduce recurrence of condition will improve, Ability to verbalize feelings will improve, Ability to disclose and discuss suicidal ideas, Ability to demonstrate self-control will improve, Ability to identify and develop effective coping behaviors will improve, Ability to maintain clinical measurements within normal limits will improve, Compliance with prescribed medications will improve, and Ability to identify triggers associated with substance abuse/mental health issues will improve  Physician Treatment Plan for Secondary Diagnosis: Principal Problem:   MDD (major depressive disorder), recurrent episode, severe (HCC)  I certify that inpatient services furnished can reasonably be expected to improve the patient's condition.    Alejandro JAYSON Azure, FNP 7/18/20251:51 PM

## 2023-12-30 NOTE — Progress Notes (Signed)
   12/30/23 1334  Psych Admission Type (Psych Patients Only)  Admission Status Voluntary  Psychosocial Assessment  Patient Complaints None  Eye Contact Fair  Facial Expression Animated  Affect Flat  Speech Logical/coherent  Interaction Assertive  Motor Activity Other (Comment) (Unremarkable)  Appearance/Hygiene Unremarkable  Behavior Characteristics Cooperative  Mood Pleasant  Thought Process  Coherency WDL  Content WDL  Delusions None reported or observed  Perception WDL  Hallucination None reported or observed  Judgment WDL  Confusion None  Danger to Self  Current suicidal ideation? Denies  Agreement Not to Harm Self Yes  Description of Agreement Verbal  Danger to Others  Danger to Others None reported or observed

## 2023-12-30 NOTE — Progress Notes (Incomplete)
 43 year old Caucasian male, married, employed, lives with his family.  Background history of MDD recurrent.  Referred to the hospital by his PCP on account of worsening depression associated with suicidal thoughts.  Having a family as a huge protective factor for him.  No intent to act on his thoughts.  Interested in starting medication and getting into therapy in the community.  Interpersonal relational issues at his current job as a major perpetuating factor for his current mental state. Patient responded well to Escitalopram  in the past.  We can initiate this again at 5 mg at bedtime and hopefully titrate in a couple of days.  We will get collateral from his wife and evaluate him further.

## 2023-12-30 NOTE — [facility] Suicide Risk Assessment (Signed)
 [facility] INPATIENT:  Family/Significant Other Suicide Prevention Education  Suicide Prevention Education:  Contact Attempts: ambrosio, reuter, [phone], ( has been identified by the patient as the family member/significant other with whom the patient will be residing, and identified as the person(s) who will aid the patient in the event of a mental health crisis.  With written consent from the patient, two attempts were made to provide suicide prevention education, prior to and/or following the patient's discharge.  We were unsuccessful in providing suicide prevention education.  A suicide education pamphlet was given to the patient to share with family/significant other.  Date and time of first attempt: 12/30/23 at 11:30 am Date and time of second attempt: second attempt needed  Alejandro [name] 12/30/2023, 11:39 AM

## 2023-12-30 NOTE — BH IP Treatment Plan (Signed)
 Interdisciplinary Treatment and Diagnostic Plan Update  12/30/2023 Time of Session: 1030am Alejandro [name] MRN: [id number]  Principal Diagnosis: MDD (major depressive disorder), recurrent episode, severe (HCC)  Secondary Diagnoses: Principal Problem:   MDD (major depressive disorder), recurrent episode, severe (HCC)   Current Medications:  Current Facility-Administered Medications  Medication Dose Route Frequency Provider Last Rate Last Admin   acetaminophen  (TYLENOL ) tablet 650 mg  650 mg Oral Q6H PRN Brent, Amanda C, NP       alum & mag hydroxide-simeth (MAALOX/MYLANTA) 200-200-20 MG/5ML suspension 30 mL  30 mL Oral Q4H PRN Brent, Amanda C, NP       haloperidol  (HALDOL ) tablet 5 mg  5 mg Oral TID PRN Brent, Amanda C, NP       And   diphenhydrAMINE  (BENADRYL ) capsule 50 mg  50 mg Oral TID PRN Brent, Amanda C, NP       haloperidol  lactate (HALDOL ) injection 5 mg  5 mg Intramuscular TID PRN Thresa Alan BROCKS, NP       And   diphenhydrAMINE  (BENADRYL ) injection 50 mg  50 mg Intramuscular TID PRN Brent, Amanda C, NP       And   LORazepam  (ATIVAN ) injection 2 mg  2 mg Intramuscular TID PRN Brent, Amanda C, NP       haloperidol  lactate (HALDOL ) injection 10 mg  10 mg Intramuscular TID PRN Thresa Alan BROCKS, NP       And   diphenhydrAMINE  (BENADRYL ) injection 50 mg  50 mg Intramuscular TID PRN Brent, Amanda C, NP       And   LORazepam  (ATIVAN ) injection 2 mg  2 mg Intramuscular TID PRN Brent, Amanda C, NP       hydrOXYzine  (ATARAX ) tablet 25 mg  25 mg Oral TID PRN Brent, Amanda C, NP   25 mg at 12/30/23 1150   magnesium  hydroxide (MILK OF MAGNESIA) suspension 30 mL  30 mL Oral Daily PRN Brent, Amanda C, NP       rosuvastatin  (CRESTOR ) tablet 40 mg  40 mg Oral Daily Brent, Amanda C, NP   40 mg at 12/30/23 9193   traZODone  (DESYREL ) tablet 50 mg  50 mg Oral QHS PRN Brent, Amanda C, NP   50 mg at 12/29/23 2103   Vitamin D  (Ergocalciferol ) (DRISDOL ) 1.25 MG (50000 UNIT) capsule 50,000 Units   50,000 Units Oral Q7 days Brent, Amanda C, NP   50,000 Units at 12/30/23 1117   PTA Medications: Medications Prior to Admission  Medication Sig Dispense Refill Last Dose/Taking   meclizine  (ANTIVERT ) 25 MG tablet Take 1 tablet (25 mg total) by mouth 2 (two) times daily as needed for dizziness. (Patient not taking: Reported on 12/29/2023) 30 tablet 0    rosuvastatin  (CRESTOR ) 40 MG tablet Take 1 tablet (40 mg total) by mouth daily. 90 tablet 0    tirzepatide  (ZEPBOUND ) 12.5 MG/0.5ML Pen Inject 12.5 mg into the skin once a week. (Patient not taking: Reported on 12/29/2023) 6 mL 2    Vitamin D , Ergocalciferol , (DRISDOL ) 1.25 MG (50000 UNIT) CAPS capsule Take 1 capsule (50,000 Units total) by mouth every 7 (seven) days. 5 capsule 0     Patient Stressors: Financial difficulties   Health problems   Occupational concerns   Traumatic event    Patient Strengths: Ability for insight  Active sense of humor  Average or above average intelligence  Capable of independent living  Arboriculturist fund of knowledge  Motivation for treatment/growth  Physical Health  Supportive family/friends   Treatment Modalities: Medication Management, Group therapy, Case management,  1 to 1 session with clinician, Psychoeducation, Recreational therapy.   Physician Treatment Plan for Primary Diagnosis: MDD (major depressive disorder), recurrent episode, severe (HCC) Long Term Goal(s):     Short Term Goals:    Medication Management: Evaluate patient's response, side effects, and tolerance of medication regimen.  Therapeutic Interventions: 1 to 1 sessions, Unit Group sessions and Medication administration.  Evaluation of Outcomes: Not Progressing  Physician Treatment Plan for Secondary Diagnosis: Principal Problem:   MDD (major depressive disorder), recurrent episode, severe (HCC)  Long Term Goal(s):     Short Term Goals:       Medication Management: Evaluate patient's  response, side effects, and tolerance of medication regimen.  Therapeutic Interventions: 1 to 1 sessions, Unit Group sessions and Medication administration.  Evaluation of Outcomes: Not Progressing   RN Treatment Plan for Primary Diagnosis: MDD (major depressive disorder), recurrent episode, severe (HCC) Long Term Goal(s): Knowledge of disease and therapeutic regimen to maintain health will improve  Short Term Goals: Ability to remain free from injury will improve, Ability to verbalize frustration and anger appropriately will improve, Ability to demonstrate self-control, Ability to participate in decision making will improve, Ability to verbalize feelings will improve, Ability to disclose and discuss suicidal ideas, Ability to identify and develop effective coping behaviors will improve, and Compliance with prescribed medications will improve  Medication Management: RN will administer medications as ordered by provider, will assess and evaluate patient's response and provide education to patient for prescribed medication. RN will report any adverse and/or side effects to prescribing provider.  Therapeutic Interventions: 1 on 1 counseling sessions, Psychoeducation, Medication administration, Evaluate responses to treatment, Monitor vital signs and CBGs as ordered, Perform/monitor CIWA, COWS, AIMS and Fall Risk screenings as ordered, Perform wound care treatments as ordered.  Evaluation of Outcomes: Not Progressing   LCSW Treatment Plan for Primary Diagnosis: MDD (major depressive disorder), recurrent episode, severe (HCC) Long Term Goal(s): Safe transition to appropriate next level of care at discharge, Engage patient in therapeutic group addressing interpersonal concerns.  Short Term Goals: Engage patient in aftercare planning with referrals and resources, Increase social support, Increase ability to appropriately verbalize feelings, Increase emotional regulation, Facilitate acceptance of  mental health diagnosis and concerns, Facilitate patient progression through stages of change regarding substance use diagnoses and concerns, Identify triggers associated with mental health/substance abuse issues, and Increase skills for wellness and recovery  Therapeutic Interventions: Assess for all discharge needs, 1 to 1 time with Social worker, Explore available resources and support systems, Assess for adequacy in community support network, Educate family and significant other(s) on suicide prevention, Complete Psychosocial Assessment, Interpersonal group therapy.  Evaluation of Outcomes: Not Progressing   Progress in Treatment: Attending groups: Yes. Participating in groups: Yes. Taking medication as prescribed: Yes. Toleration medication: Yes. Family/Significant other contact made: No, will contact:  Milind Raether, arla, [phone] Patient understands diagnosis: Yes. Discussing patient identified problems/goals with staff: Yes. Medical problems stabilized or resolved: Yes. Denies suicidal/homicidal ideation: Yes. Issues/concerns per patient self-inventory: No.  New problem(s) identified: No, Describe:  none  New Short Term/Long Term Goal(s): medication stabilization, elimination of SI thoughts, development of comprehensive mental wellness plan.    Patient Goals:  Discharge quickly, find a new job, lower my depression, manage my medications  Discharge Plan or Barriers: Patient recently admitted. CSW will continue to follow and assess for appropriate referrals and possible discharge planning.  Reason for Continuation of Hospitalization: Depression Medication stabilization  Estimated Length of Stay: 3-5 days  Last 3 [place] Suicide Severity Risk Score: Flowsheet Row [facility] (Current[facility] Most recent reading at 12/29/2023  3:23 PM ED from 12/29/2023 in [facility] Most recent reading  at 12/29/2023 10:10 AM  C-SSRS RISK CATEGORY Moderate Risk Moderate Risk    Last PHQ 2/9 Scores:    12/29/2023   10:46 AM 12/28/2023   11:12 AM 10/26/2023   10:46 AM  Depression screen PHQ 2/9  Decreased Interest 3 3 0  Down, Depressed, Hopeless 3 3 0  PHQ - 2 Score 6 6 0  Altered sleeping 3 3 0  Tired, decreased energy 3 3 0  Change in appetite 3 3 0  Feeling bad or failure about yourself  3 3 0  Trouble concentrating 3 3 0  Moving slowly or fidgety/restless 3 1 0  Suicidal thoughts 3 3 0  PHQ-9 Score 27 25 0  Difficult doing work/chores Extremely dIfficult Very difficult Not difficult at all    Scribe for Treatment Team: Jenkins LULLA Primer, LCSWA 12/30/2023 1:23 PM

## 2023-12-30 NOTE — [facility] Suicide Risk Assessment (Signed)
 Suicide Risk Assessment  Admission Assessment    [facility] Admission Suicide Risk Assessment   Nursing information obtained from:  Patient Demographic factors:  Male, Caucasian Current Mental Status:  NA Loss Factors:  Financial problems / change in socioeconomic status Historical Factors:  Impulsivity Risk Reduction Factors:  Positive social support, Employed, Responsible for children under 45 years of age, Sense of responsibility to family, Living with another person, especially a relative  Total Time spent with patient: 45 minutes  Principal Problem: MDD (major depressive disorder), recurrent episode, severe (HCC) Diagnosis:  Principal Problem:   MDD (major depressive disorder), recurrent episode, severe (HCC)  Subjective Data: Alejandro [name] is a 43 year old Caucasian male with prior psychiatric history significant for MDD recurrent episodes of severe, generalized anxiety disorder, and panic disorder who presents voluntarily to [facility] from [facility] behavioral health urgent care for worsening depression and anxiety resulting in suicidal ideation in the context of psychosocial stressors and work stressors.  UDS positive for benzodiazepine, which patient reports Xanax  was being given to him by his wife for anxiety.  Continued Clinical Symptoms:  Alcohol Use Disorder Identification Test Final Score (AUDIT): 0 The Alcohol Use Disorders Identification Test, Guidelines for Use in Primary Care, Second Edition.  World Science writer [facility]). Score between 0-7:  no or low risk or alcohol related problems. Score between 8-15:  moderate risk of alcohol related problems. Score between 16-19:  high risk of alcohol related problems. Score 20 or above:  warrants further diagnostic evaluation for alcohol dependence and treatment.  CLINICAL FACTORS:   Severe Anxiety and/or Agitation Depression:   Anhedonia Hopelessness Impulsivity Severe More than one  psychiatric diagnosis Previous Psychiatric Diagnoses and Treatments Medical Diagnoses and Treatments/Surgeries  Musculoskeletal: Strength & Muscle Tone: within normal limits Gait & Station: normal Patient leans: N/A  Psychiatric Specialty Exam:  Presentation  General Appearance:  Appropriate for Environment; Casual; Fairly Groomed  Eye Contact: Good  Speech: Clear and Coherent; Normal Rate  Speech Volume: Normal  Handedness: Right  Mood and Affect  Mood: Depressed; Anxious  Affect: Appropriate; Congruent  Thought Process  Thought Processes: Coherent; Goal Directed  Descriptions of Associations:Intact  Orientation:Full (Time, Place and Person)  Thought Content:Logical  History of Schizophrenia/Schizoaffective disorder:No  Duration of Psychotic Symptoms:No data recorded Hallucinations:Hallucinations: None  Ideas of Reference:None  Suicidal Thoughts:Suicidal Thoughts: No SI Active Intent and/or Plan: -- (Denies) SI Passive Intent and/or Plan: -- (Denies)  Homicidal Thoughts:Homicidal Thoughts: No  Sensorium  Memory: Immediate Good; Recent Good  Judgment: Good  Insight: Good  Executive Functions  Concentration: Good  Attention Span: Good  Recall: Good  Fund of Knowledge: Good  Language: Good  Psychomotor Activity  Psychomotor Activity: Psychomotor Activity: Normal  Assets  Assets: Communication Skills; Desire for Improvement; Physical Health; Social Support  Sleep  Sleep: Sleep: Good Number of Hours of Sleep: 8  Physical Exam: Physical Exam Vitals and nursing note reviewed.  Constitutional:      General: He is not in acute distress.    Appearance: He is normal weight. He is not ill-appearing.  HENT:     Head: Normocephalic.     Right Ear: External ear normal.     Left Ear: External ear normal.     Nose: Nose normal.     Mouth/Throat:     Mouth: Mucous membranes are moist.     Pharynx: Oropharynx is clear.  Eyes:      Extraocular Movements: Extraocular movements intact.  Cardiovascular:  Rate and Rhythm: Normal rate.     Pulses: Normal pulses.  Pulmonary:     Effort: Pulmonary effort is normal.  Abdominal:     Comments: Deferred  Genitourinary:    Comments: Deferred  Musculoskeletal:        General: Normal range of motion.     Cervical back: Normal range of motion.  Neurological:     General: No focal deficit present.     Mental Status: He is alert and oriented to person, place, and time.  Psychiatric:        Mood and Affect: Mood normal.        Behavior: Behavior normal.        Thought Content: Thought content normal.    Review of Systems  Constitutional:  Negative for chills and fever.  HENT:  Negative for sore throat.   Eyes:  Negative for blurred vision.  Respiratory:  Negative for cough, sputum production, shortness of breath and wheezing.   Cardiovascular:  Negative for chest pain and palpitations.  Gastrointestinal:  Negative for abdominal pain, constipation, diarrhea, heartburn, nausea and vomiting.  Genitourinary:  Negative for dysuria.  Musculoskeletal:  Negative for falls.  Skin:  Negative for itching and rash.  Neurological:  Negative for dizziness and headaches.  Endo/Heme/Allergies:        See allergy listing  Psychiatric/Behavioral:  Positive for depression. The patient is nervous/anxious.    Blood pressure 116/80, pulse 84, temperature 97.7 F (36.5 C), temperature source Oral, resp. rate 18, height 5' 7.5 (1.715 m), weight 93.4 kg, SpO2 97%. Body mass index is 31.79 kg/m.  COGNITIVE FEATURES THAT CONTRIBUTE TO RISK:  Polarized thinking    SUICIDE RISK:   Severe:  Frequent, intense, and enduring suicidal ideation, specific plan, no subjective intent, but some objective markers of intent (i.e., choice of lethal method), the method is accessible, some limited preparatory behavior, evidence of impaired self-control, severe dysphoria/symptomatology, multiple risk  factors present, and few if any protective factors, particularly a lack of social support.  PLAN OF CARE: Treatment Plan Summary: Daily contact with patient to assess and evaluate symptoms and progress in treatment and Medication management  Assessment:  Alejandro [name] is a 43 year old Caucasian male with prior psychiatric history significant for MDD recurrent episodes of severe, generalized anxiety disorder, and panic disorder who presents voluntarily to [facility] from [facility] behavioral health urgent care for worsening depression and anxiety resulting in suicidal ideation in the context of psychosocial stressors and work stressors.  UDS positive for benzodiazepine, which patient reports Xanax  was being given to him by his wife for anxiety.  Physician Treatment Plan for Primary Diagnosis:  MDD (major depressive disorder), recurrent episode, severe (HCC)  Plans: Medications: --Lexapro  tablet 5 mg p.o. daily for depression and anxiety.  May titrate 10 mg p.o. daily by 12/31/2023. --Hydroxyzine  tablets 25 mg p.o. 3 times daily as needed for anxiety --Trazodone  tablets 50 mg p.o. q. nightly as needed for insomnia  Medications for other medical problems: -- Continue Crestor  tablet 40 mg p.o. daily for hyperlipidemia --Vitamin D  capsule 50,000 international units p.o. q. 7 days for vitamin D  deficiency  Other PRN Medications  -Acetaminophen  650 mg [name] 6 as needed/mild pain  -Maalox 30 mL oral [name] 4 as needed/digestion  -Magnesium  hydroxide 30 mL daily as needed/mild constipation   --The risks/benefits/side-effects/alternatives to this medication were discussed in detail with the patient and time was given for questions. The patient consents to medication trial.   --  Metabolic profile and EKG monitoring obtained while on an atypical antipsychotic (BMI: Lipid Panel: HbgA1c: QTc:)   -- Encouraged patient to participate in unit milieu and in scheduled group  therapies   Continue BH Agitation Protocol  --Haldol  5 mg, oral, 3 times daily as needed, mild agitation  --Benadryl  50 mg, oral, 3 times daily as needed, mild agitation                                      OR   --Haldol  injection 5 mg, IM, 3 times daily as needed, moderate agitation  --Benadryl  injection 50 mg, IM, 3 times daily as needed, moderate agitation  --Ativan  injection 2 mg, IM, 3 times daily as needed, moderate agitation                                      OR  --Haldol  injection 10 mg, IM, 3 times daily as needed, severe agitation  --Benadryl  injection 50 mg, IM, 3 times daily as needed, severe agitation  --Ativan  injection 2 mg, IM, 3 times daily as needed, severe agitation   Admission labs reviewed: CMP: Glucose 110, otherwise normal.  Lipid profile: Cholesterol 215, LDL 144.  Otherwise normal.  CBC with differential: Platelet 430, otherwise normal.  Hemoglobin A1c 4.9, normal.  TSH: 1.563, normal.  UDS: Positive for benzodiazepine.  BAL: Less than 15.  New labs ordered: None  EKG revealed: Normal sinus rhythm, ventricular rate 83, QT/QTc 364/427  Safety and Monitoring:  Voluntary admission to inpatient psychiatric unit for safety, stabilization and treatment  Daily contact with patient to assess and evaluate symptoms and progress in treatment  Patient's case to be discussed in multi-disciplinary team meeting   Observation Level : q15 minute checks  Vital signs: q12 hours  Precautions: suicide, but pt currently verbally contracts for safety on unit?   Discharge Planning:  Social work and case management to assist with discharge planning and identification of hospital follow-up needs prior to discharge  Estimated LOS: 5-7?days  Discharge Concerns: Need to establish a safety plan; Medication compliance and effectiveness   Discharge Goals:  Return home with outpatient referrals for mental health follow-up including medication management/psychotherapy.   Long Term  Goal(s): Improvement in symptoms so as ready for discharge  Short Term Goals: Ability to identify changes in lifestyle to reduce recurrence of condition will improve, Ability to verbalize feelings will improve, Ability to disclose and discuss suicidal ideas, Ability to demonstrate self-control will improve, Ability to identify and develop effective coping behaviors will improve, Ability to maintain clinical measurements within normal limits will improve, Compliance with prescribed medications will improve, and Ability to identify triggers associated with substance abuse/mental health issues will improve  Physician Treatment Plan for Secondary Diagnosis: Principal Problem:   MDD (major depressive disorder), recurrent episode, severe (HCC)  I certify that inpatient services furnished can reasonably be expected to improve the patient's condition.   Ellouise JAYSON Azure, FNP 12/30/2023, 1:31 PM

## 2023-12-30 NOTE — Group Note (Signed)
 Recreation Therapy Group Note   Group Topic:Leisure Education  Group Date: 12/30/2023 Start Time: 0930 End Time: 0957 Facilitators: Rochel Privett-McCall, LRT,CTRS Location: [address]   Group Topic: Leisure Education    Goal Area(s) Addresses:  Patient will successfully identify benefits of leisure participation. Patient will successfully identify ways to access leisure activities. Patient will identify leisure activities available based on their geographical location in proximity to their home.   Behavioral Response: Engaged   Intervention: Poster Making   Activity: LRT and patients discussed leisure and its importance in a persons development. Patients had the option of working in groups or individually. Groups were given a poster board and writing utensils. Patients were to then create and advertisement that highlighted what leisure is, who can benefit and examples of leisure activities. Patients were to create their PSA in the form of a print ad or commercial. Patients presented their PSAs at the end of group.    Education:  Leisure Programme researcher, broadcasting/film/video, Publishing copy Outcome: Acknowledges education   Affect/Mood: Appropriate   Participation Level: Engaged   Participation Quality: Independent   Behavior: Appropriate   Speech/Thought Process: Focused   Insight: Good   Judgement: Good   Modes of Intervention: Art   Patient Response to Interventions:  Engaged   Education Outcome:  In group clarification offered    Clinical Observations/Individualized Feedback: Pt followed along with the rules of the activity. Pt identified some of the benefits of leisure being: extend life expectancy, improve physical fitness, sleep better, increase mental capacity, etc. Pt also created a phone for people to call if they needed extra information on leisure. Pt also identified some leisure activities as yoga, cooking, swimming, music, traveling, etc.     Plan:  Continue to engage patient in RT group sessions 2-3x/week.   Alejandro [name], LRT,CTRS 12/30/2023 11:55 AM

## 2023-12-30 NOTE — Telephone Encounter (Signed)
 Called his wife back.  Message relayed from patient through her.   Patient wants to know about discharge planning and starting outpatient therapy.    He would like to be discharged when possible, to spend more time with family.    Discussed that I would pass her message along to the inpatient team.  I thank all involved.  Routed to Dr. Randeen as RICK.

## 2023-12-31 MED ORDER — ESCITALOPRAM OXALATE 10 MG PO TABS
10.0000 mg | ORAL_TABLET | Freq: Every day | ORAL | Status: DC
Start: 2024-01-01 — End: 2024-01-01
  Administered 2024-01-01: 10 mg via ORAL
  Filled 2023-12-31: qty 1

## 2023-12-31 NOTE — Plan of Care (Signed)
   Problem: Education: Goal: Emotional status will improve Outcome: Progressing Goal: Mental status will improve Outcome: Progressing

## 2023-12-31 NOTE — Group Note (Signed)
 Date/Time:  @TD @ 1:00-2:00  Type of Therapy and Topic:  Group Therapy:  Safety  Participation Level:  Active   Description of Group This process group involved a discussion with and between patients about Safety.   This group revolves around central idea: You need to stay safe, the patient will learn to cope safely, no matter what negative life events come their way. The patient will identify and describe their safe and unsafe environment.   This then was followed by a discussion about how the patient are committed to their self, what it is, how important it is, and why we choose the coping techniques.  Participants were encouraged to think about commitment as necessary and positive.  Therapeutic Goals Patient will identify and describe one their safety Patient will participate in generating ideas about safety and how it can impact their environment The patient are encourage to explore their safety and who they would want to be their safety person.   Summary of Patient Progress:  The patient expressed that his safe place is home and that his unsafe place is work. The patient stated that work causes stress for him.   Therapeutic Modalities Brief Solution-Focused Therapy Psychoeducation  Zakara Parkey O Tonilynn Bieker, LCSWA 12/31/2023  4:38 PM

## 2023-12-31 NOTE — [facility] Group Notes (Signed)
 [facility] Group Notes:  (Nursing/MHT/Case Management/Adjunct)  Date:  12/31/2023  Time:  8:52 PM  Type of Therapy:  Wrap-up group  Participation Level:  Active  Participation Quality:  Appropriate  Affect:  Appropriate  Cognitive:  Appropriate  Insight:  Appropriate  Engagement in Group:  Engaged  Modes of Intervention:  Exploration  Summary of Progress/Problems: Goal to work on D/C. Met goal, Rated day 7/10  Grayce LITTIE Essex 12/31/2023, 8:52 PM

## 2023-12-31 NOTE — Progress Notes (Addendum)
 D. Pt has been friendly upon approach, has been visible in the milieu, observed interacting appropriately with peers and staff and attending groups. Per pt's self inventory, pt rated his depression, hopelessness and anxiety all 0's.  Pt currently denies SI/HI and AVH  A. Labs and vitals monitored. Pt supported emotionally and encouraged to express concerns and ask questions.   R. Pt remains safe with 15 minute checks. Will continue POC.    12/31/23 1300  Psych Admission Type (Psych Patients Only)  Admission Status Voluntary  Psychosocial Assessment  Patient Complaints None  Eye Contact Fair  Facial Expression Animated  Affect Appropriate to circumstance  Speech Logical/coherent  Interaction Assertive  Motor Activity Other (Comment) (steady gait)  Appearance/Hygiene Unremarkable  Behavior Characteristics Cooperative;Appropriate to situation  Mood Pleasant  Thought Process  Coherency WDL  Content WDL  Delusions None reported or observed  Perception WDL  Hallucination None reported or observed  Judgment WDL  Confusion None  Danger to Self  Current suicidal ideation? Denies  Danger to Others  Danger to Others None reported or observed

## 2023-12-31 NOTE — Progress Notes (Signed)
   12/31/23 1940  Psych Admission Type (Psych Patients Only)  Admission Status Voluntary  Psychosocial Assessment  Patient Complaints None  Eye Contact Fair  Facial Expression Anxious  Affect Appropriate to circumstance;Anxious  Speech Logical/coherent  Interaction Assertive  Motor Activity Other (Comment) (wnl)  Appearance/Hygiene Unremarkable  Behavior Characteristics Cooperative;Appropriate to situation  Mood Pleasant  Thought Process  Coherency WDL  Content WDL  Delusions None reported or observed  Perception WDL  Hallucination None reported or observed  Judgment WDL  Confusion None  Danger to Self  Current suicidal ideation? Denies

## 2023-12-31 NOTE — Plan of Care (Signed)
   Problem: Education: Goal: Emotional status will improve Outcome: Progressing Goal: Mental status will improve Outcome: Progressing   Problem: Activity: Goal: Interest or engagement in activities will improve Outcome: Progressing Goal: Sleeping patterns will improve Outcome: Progressing

## 2023-12-31 NOTE — Progress Notes (Addendum)
 [facility] MD Progress Note  12/31/2023 10:22 AM SHAILEN THIELEN  MRN:  [id number]  Principal Problem: MDD (major depressive disorder), recurrent episode, severe (HCC) Diagnosis: Principal Problem:   MDD (major depressive disorder), recurrent episode, severe (HCC)  Reason for admission: KENNY REA is a 43 year old Caucasian male with prior psychiatric history significant for MDD recurrent episodes of severe, generalized anxiety disorder, and panic disorder who presents voluntarily to [facility] from [facility] behavioral health urgent care for worsening depression and anxiety resulting in suicidal ideation in the context of psychosocial stressors and work stressors.  UDS positive for benzodiazepine, which patient reports Xanax  was being given to him by his wife for anxiety.  24-hour chart reviewed: Patient Case discussed in the interdisciplinary team meeting.  Vital signs reviewed clinical values.  Patient compliant with psychotropic medication.  No agitation protocol required.  As needed hydroxyzine  required for anxiety, and trazodone  x 1 required for insomnia.  Today's assessment notes: On assessment today, the pt reports that his mood is less depressed, and rated depression as numbers 0/10 with 10 being most severe.  Presents alert, calm, cooperative, and oriented to person, time, place, and situation.  Chart reviewed and findings shared with the treatment team and consulted attending psychiatrist with recommendation to increase his Lexapro  to 10 mg p.o. daily for anxiety and depression.  Speech clear, coherent with normal volume and pattern.  Observed attending and participating effectively in therapeutic milieu and unit group activities.  Objectively not responding to internal stimuli.  Patient endorses plans to attend outpatient therapy after discharge from the day hospital.  He also plans to quit his job and find another job and find another less stressful job after  discharge from [facility].  He denies SI, HI, or AVH, able to contract for safety while in the hospital.  Safety measures every 15 minutes observed by nursing staff. Reports that anxiety is at manageable level Patient reports he slept about 9 hours throughout the night and being restful.   Appetite is good Concentration is improving Energy level is adequate Denies suicidal thoughts. Further denies suicidal intent and plan.  Denies having any HI.  Denies having psychotic symptoms.   Denies having side effects to current psychiatric medications.   We discussed compliance to current medication regimen.  Collateral information: Spoke with patient's spouse Barret Esquivel at [phone] for more information on patient's pending discharge.  Wife reports that she spoke with patient and his mood is better and could be discharged to his home with her.  Wife added that she would prefer for the social workers to recommend a therapist for patient to follow-up after discharge from [facility].  Reports that it is ok for patient to be discharged on Sunday, since the family is traveling to visit her parents in [place] on Monday morning.  Total Time spent with patient: 45 Minutes.  Past Psychiatric History: Previous Psych Diagnoses: MDD without psychotic features, generalized anxiety disorder Prior inpatient treatment: Denies Current/prior outpatient treatment: Reports seeing a therapist 2 years ago Prior rehab hx: Denies Psychotherapy hx: Years History of suicide: Denies History of homicide or aggression: Denies Psychiatric medication history: Before being on trial of Lexapro , and viibryd  2 years ago Neuromodulation history: He denies Current Psychiatrist: Denies Current therapist: Denies   Past Medical History:  Past Medical History:  Diagnosis Date   ADD (attention deficit disorder)    Alcohol abuse    Anxiety    Back pain    Bursitis of both  knees    Degenerative disc disease, lumbar    Depression    Drug  use    Fatty liver    Floppy eyelid syndrome    GERD (gastroesophageal reflux disease)    Hyperlipidemia    Hypertension    Obesity    Plantar fasciitis    followed by ortho   Sleep apnea    SOB (shortness of breath)     Past Surgical History:  Procedure Laterality Date   US  ECHOCARDIOGRAPHY  08/2010   normal, no LVH   Family History:  Family History  Problem Relation Age of Onset   Anxiety disorder Mother    Alcohol abuse Mother    Diabetes Mother    Depression Mother    Sleep apnea Mother    Alcoholism Mother    Obesity Mother    ADD / ADHD Father    OCD Father    Hyperlipidemia Father    Anxiety disorder Father    Hypertension Maternal Grandfather    CAD Maternal Grandfather 30       MI, CABG   CAD Paternal Grandfather 15   Depression Paternal Grandmother    Anxiety disorder Paternal Grandmother    Colon cancer Neg Hx    Prostate cancer Neg Hx    Family Psychiatric  History: See H&P Social History:  Social History   Substance and Sexual Activity  Alcohol Use No     Social History   Substance and Sexual Activity  Drug Use No    Social History   Socioeconomic History   Marital status: Married    Spouse name: Lauraine   Number of children: 2   Years of education: 18   Highest education level: Master's degree (e.g., MA, MS, MEng, MEd, MSW, MBA)  Occupational History   Occupation: [employer], jewellery: Warden/ranger    Comment: currently seeking employment  Tobacco Use   Smoking status: Never   Smokeless tobacco: Never  Substance and Sexual Activity   Alcohol use: No   Drug use: No   Sexual activity: Yes  Other Topics Concern   Not on file  Social History Narrative   Lives in [place] with wife and two children.    Married 2011   2 sons (born 2012 and 2015)   Social Drivers of Corporate investment banker Strain: Not on file  Food Insecurity: No Food Insecurity (12/29/2023)   Hunger Vital Sign    Worried  About Running Out of Food in the Last Year: Never true    Ran Out of Food in the Last Year: Never true  Transportation Needs: No Transportation Needs (12/29/2023)   PRAPARE - Administrator, Civil Service (Medical): No    Lack of Transportation (Non-Medical): No  Physical Activity: Not on file  Stress: Not on file  Social Connections: Unknown (12/29/2023)   Social Connection and Isolation Panel    Frequency of Communication with Friends and Family: Three times a week    Frequency of Social Gatherings with Friends and Family: Once a week    Attends Religious Services: Not on Marketing executive or Organizations: Not on file    Attends Banker Meetings: Not on file    Marital Status: Married   Additional Social History:    Sleep: Good Estimated Sleeping Duration (Last 24 Hours): 5.25-6.75 hours  Appetite:  Good  Current Medications: Current Facility-Administered Medications  Medication Dose  Route Frequency Provider Last Rate Last Admin   acetaminophen  (TYLENOL ) tablet 650 mg  650 mg Oral Q6H PRN Brent, Amanda C, NP       alum & mag hydroxide-simeth (MAALOX/MYLANTA) 200-200-20 MG/5ML suspension 30 mL  30 mL Oral Q4H PRN Brent, Amanda C, NP       haloperidol  (HALDOL ) tablet 5 mg  5 mg Oral TID PRN Brent, Amanda C, NP       And   diphenhydrAMINE  (BENADRYL ) capsule 50 mg  50 mg Oral TID PRN Brent, Amanda C, NP       haloperidol  lactate (HALDOL ) injection 5 mg  5 mg Intramuscular TID PRN Brent, Amanda C, NP       And   diphenhydrAMINE  (BENADRYL ) injection 50 mg  50 mg Intramuscular TID PRN Brent, Amanda C, NP       And   LORazepam  (ATIVAN ) injection 2 mg  2 mg Intramuscular TID PRN Brent, Amanda C, NP       haloperidol  lactate (HALDOL ) injection 10 mg  10 mg Intramuscular TID PRN Thresa Alan BROCKS, NP       And   diphenhydrAMINE  (BENADRYL ) injection 50 mg  50 mg Intramuscular TID PRN Brent, Amanda C, NP       And   LORazepam  (ATIVAN ) injection 2 mg  2  mg Intramuscular TID PRN Brent, Amanda C, NP       escitalopram  (LEXAPRO ) tablet 5 mg  5 mg Oral Daily Sameena Artus C, FNP   5 mg at 12/31/23 0755   hydrOXYzine  (ATARAX ) tablet 25 mg  25 mg Oral TID PRN Brent, Amanda C, NP   25 mg at 12/31/23 0756   magnesium  hydroxide (MILK OF MAGNESIA) suspension 30 mL  30 mL Oral Daily PRN Brent, Amanda C, NP       rosuvastatin  (CRESTOR ) tablet 40 mg  40 mg Oral Daily Brent, Amanda C, NP   40 mg at 12/31/23 0755   traZODone  (DESYREL ) tablet 50 mg  50 mg Oral QHS PRN Brent, Amanda C, NP   50 mg at 12/30/23 2105   Vitamin D  (Ergocalciferol ) (DRISDOL ) 1.25 MG (50000 UNIT) capsule 50,000 Units  50,000 Units Oral Q7 days Brent, Amanda C, NP   50,000 Units at 12/30/23 1117   Lab Results: No results found for this or any previous visit (from the past 48 hours).  Blood Alcohol level:  Lab Results  Component Value Date   [facility] <15 12/29/2023   Metabolic Disorder Labs: Lab Results  Component Value Date   HGBA1C 4.9 12/29/2023   MPG 93.93 12/29/2023   MPG 103 07/07/2021   No results found for: PROLACTIN Lab Results  Component Value Date   CHOL 215 (H) 12/29/2023   TRIG 75 12/29/2023   HDL 56 12/29/2023   CHOLHDL 3.8 12/29/2023   VLDL 15 12/29/2023   LDLCALC 144 (H) 12/29/2023   LDLCALC 162 (H) 09/01/2023   Physical Findings: AIMS:  ,  ,  ,  ,  ,  ,   CIWA:    COWS:     Musculoskeletal: Strength & Muscle Tone: within normal limits Gait & Station: normal Patient leans: N/A  Psychiatric Specialty Exam:  Presentation  General Appearance:  Appropriate for Environment; Casual; Fairly Groomed  Eye Contact: Good  Speech: Clear and Coherent  Speech Volume: Normal  Handedness: Right  Mood and Affect  Mood: Euthymic  Affect: Congruent  Thought Process  Thought Processes: Coherent; Goal Directed  Descriptions of Associations:Intact  Orientation:Full (Time,  Place and Person)  Thought Content:Logical  History of  Schizophrenia/Schizoaffective disorder:No  Duration of Psychotic Symptoms:No data recorded Hallucinations:Hallucinations: None  Ideas of Reference:None  Suicidal Thoughts:Suicidal Thoughts: No SI Active Intent and/or Plan: -- (Denies) SI Passive Intent and/or Plan: -- (Denies)  Homicidal Thoughts:Homicidal Thoughts: No  Sensorium  Memory: Immediate Good; Recent Good  Judgment: Fair  Insight: Fair  Art therapist  Concentration: Good  Attention Span: Good  Recall: Good  Fund of Knowledge: Good  Language: Good  Psychomotor Activity  Psychomotor Activity: Psychomotor Activity: Normal  Assets  Assets: Communication Skills; Desire for Improvement; Physical Health; Resilience  Sleep  Sleep: Sleep: Good Number of Hours of Sleep: 9  Physical Exam: Physical Exam Vitals and nursing note reviewed.  Constitutional:      General: He is not in acute distress.    Appearance: He is normal weight. He is not ill-appearing.  HENT:     Head: Normocephalic.     Right Ear: External ear normal.     Left Ear: External ear normal.     Nose: Nose normal.     Mouth/Throat:     Mouth: Mucous membranes are moist.     Pharynx: Oropharynx is clear.  Eyes:     Extraocular Movements: Extraocular movements intact.  Cardiovascular:     Rate and Rhythm: Normal rate.     Pulses: Normal pulses.  Pulmonary:     Effort: Pulmonary effort is normal.  Abdominal:     Comments: Deferred  Genitourinary:    Comments: Deferred  Musculoskeletal:        General: Normal range of motion.     Cervical back: Normal range of motion.  Skin:    General: Skin is warm.  Neurological:     General: No focal deficit present.     Mental Status: He is alert and oriented to person, place, and time.  Psychiatric:        Mood and Affect: Mood normal.        Behavior: Behavior normal.        Thought Content: Thought content normal.    Review of Systems  Constitutional:  Negative for  chills and fever.  HENT:  Negative for sore throat.   Eyes:  Negative for blurred vision.  Respiratory:  Negative for cough, sputum production, shortness of breath and wheezing.   Cardiovascular:  Negative for chest pain and palpitations.  Gastrointestinal:  Negative for heartburn and nausea.  Genitourinary:  Negative for dysuria, frequency and urgency.  Musculoskeletal:  Negative for falls.  Skin:  Negative for itching and rash.  Neurological:  Negative for dizziness and headaches.  Endo/Heme/Allergies:        See allergy listing  Psychiatric/Behavioral:  Positive for depression. Negative for hallucinations, substance abuse and suicidal ideas. The patient is nervous/anxious. The patient does not have insomnia.    Blood pressure 114/83, pulse 74, temperature 98.7 F (37.1 C), temperature source Oral, resp. rate 16, height 5' 7.5 (1.715 m), weight 93.4 kg, SpO2 98%. Body mass index is 31.79 kg/m.  Treatment Plan Summary: Daily contact with patient to assess and evaluate symptoms and progress in treatment and Medication management Treatment Plan Summary: Daily contact with patient to assess and evaluate symptoms and progress in treatment and Medication management Assessment:  ALYJAH LOVINGOOD is a 43 year old Caucasian male with prior psychiatric history significant for MDD recurrent episodes of severe, generalized anxiety disorder, and panic disorder who presents voluntarily to [facility] from [facility]  behavioral health urgent care for worsening depression and anxiety resulting in suicidal ideation in the context of psychosocial stressors and work stressors.  UDS positive for benzodiazepine, which patient reports Xanax  was being given to him by his wife for anxiety.   Physician Treatment Plan for Primary Diagnosis:  MDD (major depressive disorder), recurrent episode, severe (HCC)   Plans: Medications: --Increase Lexapro  tablet from 5 mg to 10 mg p.o.  daily for depression and anxiety.   --Continue hydroxyzine  tablets 25 mg p.o. 3 times daily as needed for anxiety --Continue trazodone  tablets 50 mg p.o. q. nightly as needed for insomnia   Medications for other medical problems: --Continue Crestor  tablet 40 mg p.o. daily for hyperlipidemia --Vitamin D  capsule 50,000 international units p.o. q. 7 days for vitamin D  deficiency   Other PRN Medications  -Acetaminophen  650 mg every 6 as needed/mild pain  -Maalox 30 mL oral every 4 as needed/digestion  -Magnesium  hydroxide 30 mL daily as needed/mild constipation    --The risks/benefits/side-effects/alternatives to this medication were discussed in detail with the patient and time was given for questions. The patient consents to medication trial.   -- Metabolic profile and EKG monitoring obtained while on an atypical antipsychotic (BMI: Lipid Panel: HbgA1c: QTc:)   -- Encouraged patient to participate in unit milieu and in scheduled group therapies    Continue BH Agitation Protocol  --Haldol  5 mg, oral, 3 times daily as needed, mild agitation  --Benadryl  50 mg, oral, 3 times daily as needed, mild agitation                                      OR   --Haldol  injection 5 mg, IM, 3 times daily as needed, moderate agitation  --Benadryl  injection 50 mg, IM, 3 times daily as needed, moderate agitation  --Ativan  injection 2 mg, IM, 3 times daily as needed, moderate agitation                                      OR  --Haldol  injection 10 mg, IM, 3 times daily as needed, severe agitation  --Benadryl  injection 50 mg, IM, 3 times daily as needed, severe agitation  --Ativan  injection 2 mg, IM, 3 times daily as needed, severe agitation    Admission labs reviewed: CMP: Glucose 110, otherwise normal.  Lipid profile: Cholesterol 215, LDL 144.  Otherwise normal.  CBC with differential: Platelet 430, otherwise normal.  Hemoglobin A1c 4.9, normal.  TSH: 1.563, normal.  UDS: Positive for benzodiazepine.  BAL: Less  than 15.   New labs ordered: None   EKG revealed: Normal sinus rhythm, ventricular rate 83, QT/QTc 364/427   Safety and Monitoring:  Voluntary admission to inpatient psychiatric unit for safety, stabilization and treatment  Daily contact with patient to assess and evaluate symptoms and progress in treatment  Patient's case to be discussed in multi-disciplinary team meeting    Observation Level : q15 minute checks  Vital signs: q12 hours  Precautions: suicide, but pt currently verbally contracts for safety on unit?    Discharge Planning:  Social work and case management to assist with discharge planning and identification of hospital follow-up needs prior to discharge  Estimated LOS: 5-7?days  Discharge Concerns: Need to establish a safety plan; Medication compliance and effectiveness    Discharge Goals:  Return home with outpatient  referrals for mental health follow-up including medication management/psychotherapy.    Long Term Goal(s): Improvement in symptoms so as ready for discharge   Short Term Goals: Ability to identify changes in lifestyle to reduce recurrence of condition will improve, Ability to verbalize feelings will improve, Ability to disclose and discuss suicidal ideas, Ability to demonstrate self-control will improve, Ability to identify and develop effective coping behaviors will improve, Ability to maintain clinical measurements within normal limits will improve, Compliance with prescribed medications will improve, and Ability to identify triggers associated with substance abuse/mental health issues will improve   Physician Treatment Plan for Secondary Diagnosis: Principal Problem:   MDD (major depressive disorder), recurrent episode, severe (HCC)   I certify that inpatient services furnished can reasonably be expected to improve the patient's condition.    Ellouise JAYSON Azure, FNP 12/31/2023, 10:22 AM

## 2023-12-31 NOTE — Plan of Care (Signed)
  Problem: Education: Goal: Mental status will improve Outcome: Progressing   Problem: Activity: Goal: Interest or engagement in activities will improve Outcome: Progressing   Problem: Safety: Goal: Periods of time without injury will increase Outcome: Progressing

## 2023-12-31 NOTE — Group Note (Signed)
 Date:  12/31/2023 Time:  9:48 AM  Group Topic/Focus:  Goals Group:   The focus of this group is to help patients establish daily goals to achieve during treatment and discuss how the patient can incorporate goal setting into their daily lives to aide in recovery. Orientation:   The focus of this group is to educate the patient on the purpose and policies of crisis stabilization and provide a format to answer questions about their admission.  The group details unit policies and expectations of patients while admitted.    Participation Level:  Active  Participation Quality:  Appropriate  Affect:  Appropriate  Cognitive:  Appropriate  Insight: Appropriate  Engagement in Group:  Engaged  Modes of Intervention:  Discussion and Orientation  Additional Comments:    Alejandro [name] D Khilynn Borntreger 12/31/2023, 9:48 AM

## 2023-12-31 NOTE — Progress Notes (Signed)
(  Sleep Hours) -6.25 (Any PRNs that were needed, meds refused, or side effects to meds)-  Trazodone  50 mg (Any disturbances and when (visitation, over night)-none (Concerns raised by the patient)- none (SI/HI/AVH)-denies all

## 2023-12-31 NOTE — Progress Notes (Signed)
   12/29/23 2000  Psych Admission Type (Psych Patients Only)  Admission Status Voluntary  Psychosocial Assessment  Patient Complaints Anxiety  Eye Contact Fair  Facial Expression Anxious  Affect Anxious  Speech Logical/coherent  Interaction Assertive  Motor Activity Other (Comment) (WDL)  Appearance/Hygiene Unremarkable  Behavior Characteristics Cooperative;Anxious  Mood Depressed  Aggressive Behavior  Effect No apparent injury  Thought Process  Coherency WDL  Content WDL  Delusions None reported or observed  Perception WDL  Hallucination None reported or observed  Judgment WDL  Confusion None  Danger to Self  Current suicidal ideation? Denies  Agreement Not to Harm Self Yes

## 2023-12-31 NOTE — Group Note (Signed)
 Date:  12/31/2023 Time:  12:41 PM  Group Topic/Focus:   Music Therapy:   The focus of this group is to utilize music lyric substitution to reframe challenges and promote change,acceptance, and resilience.   Participation Level:  Active  Participation Quality:  Appropriate  Affect:  Appropriate  Cognitive:  Appropriate  Insight: Appropriate  Engagement in Group:  Engaged  Modes of Intervention:  Exploration  Additional Comments:    Alejandro [name] 12/31/2023, 12:41 PM

## 2024-01-01 DIAGNOSIS — F332 Major depressive disorder, recurrent severe without psychotic features: Principal | ICD-10-CM

## 2024-01-01 MED ORDER — ESCITALOPRAM OXALATE 10 MG PO TABS
10.0000 mg | ORAL_TABLET | Freq: Every day | ORAL | 0 refills | Status: DC
  Filled 2024-01-01: qty 30, 30d supply, fill #0

## 2024-01-01 NOTE — Group Note (Signed)
 Date:  01/01/2024 Time:  9:17 AM  Group Topic/Focus:  Goals Group:   The focus of this group is to help patients establish daily goals to achieve during treatment and discuss how the patient can incorporate goal setting into their daily lives to aide in recovery. Orientation:   The focus of this group is to educate the patient on the purpose and policies of crisis stabilization and provide a format to answer questions about their admission.  The group details unit policies and expectations of patients while admitted.    Participation Level:  Active  Participation Quality:  Appropriate  Affect:  Appropriate  Cognitive:  Appropriate  Insight: Appropriate  Engagement in Group:  Engaged  Modes of Intervention:  Orientation  Additional Comments:  form a d/c plan  Alejandro [name] 01/01/2024, 9:17 AM

## 2024-01-01 NOTE — Progress Notes (Signed)
 Pt discharged to lobby where wife was waiting. Pt was stable and appreciative at that time. All papers and electronic prescriptions were given and valuables returned. Suicide safety plan completed. Verbal understanding expressed. Denies SI/HI and A/VH. Pt given opportunity to express concerns and ask questions.

## 2024-01-01 NOTE — Progress Notes (Signed)
(  Sleep Hours) - 4.75 (Any PRNs that were needed, meds refused, or side effects to meds)- vistaril  25 mg , trazodone  50 mg (Any disturbances and when (visitation, over night)- none (Concerns raised by the patient)- none (SI/HI/AVH)- denies all

## 2024-01-01 NOTE — Progress Notes (Signed)
  [facility] Adult Case Management Discharge Plan :  Will you be returning to the same living situation after discharge:  Yes,    At discharge, do you have transportation home?: Yes,    Do you have the ability to pay for your medications: Yes,  [employer] / [employer]  Release of information consent forms completed and in the chart;  Patient's signature needed at discharge.  Patient to Follow up at:  Follow-up Information     [facility]. Go on 01/09/2024.   Specialty: Behavioral Health Why: You have an assessment rappointment for therapy services on 01/09/24 at 11:00 am, in person.  At this time, the provider will schedule an appointment for medication management services. * Please bring your insurance card with you. Contact information: [address] [place] [address] [phone]                 Next level of care provider has access to [facility]:no  Safety Planning and Suicide Prevention discussed: [place]      vashaun, osmon, [phone]   Has patient been referred to the Quitline?: Patient refused referral for treatment  Patient has been referred for addiction treatment:   [name], LCSW 01/01/2024, 10:10 AM

## 2024-01-01 NOTE — Discharge Summary (Signed)
 Physician Discharge Summary Note  Patient:  Alejandro [name] is an 43 y.o., adult MRN:  [id number] DOB:  03/25/1981 Patient phone:  [phone] (home)  Patient address:   [address] [place] [address],  Total Time spent with patient: 45 minutes  Date of Admission:  12/29/2023 Date of Discharge: 01/01/2024  Reason for Admission:   Alejandro [name] is a 43 year old Caucasian male with prior psychiatric history significant for MDD recurrent episodes of severe, generalized anxiety disorder, and panic disorder who presents voluntarily to [name] from [facility] behavioral [facility] urgent care for worsening depression and anxiety resulting in suicidal ideation in the context of psychosocial stressors and work stressors.  UDS positive for benzodiazepine, which patient reports Xanax  was being given to him by his wife for anxiety.   During today's assessment, patient reports, I have a lot of stress at work and also at home due to being the only breadwinner in the house for my wife and my 2 children.  Sometimes I feel panicky when I am overwhelmed.  I went to my primary care physician and he told me to go to the [name] to get help for my depression and anxiety.  He also recommend that I quit my job due to my previous heart attack and distress of this job on my heart.  I am currently working in a job that is very terrible and putting in 56 to 60 hours a week.  Even then my boss told me that I am not working enough hours and wants me to come in, on the weekend to put in more hours.  I am absolutely terrified of my Boss and I just feel very defeated.  I can recall the time I have panic attacks while at work, and I am just continuing because I have to provide from my wife and 2 children.  Reports depressive symptoms to include sadness, crying, anhedonia, irritability, guilt, worthlessness, hopelessness, decreased appetite,  suicidal ideation.  He reports some anxiety symptoms that include increased worrying, chest tightness, decreased sleep and body tension, hyperventilation, sweating, GI upset, and heart palpitation.  Patient denies symptoms of psychosis, PTSD, OCD, or mania.  Denies drug use, alcohol use, smoking, marijuana use or vaping.  He reports living in a home with his wife and 2 children.   Objective: Patient presents alert, calm, cooperative, and oriented to person, place, time, and situation.  Chart reviewed and findings shared with the treatment team and consulted attending psychiatrist, with recommendation to initiate Lexapro  tablet 5 mg p.o. daily for depression and anxiety and to titrate the 10 mg p.o. daily starting 12/31/2023.  Speech is clear and coherent with normal volume and pattern.  Thought process and thought content coherent, logical, and organized.  Objectively, not responding to internal stimuli nor appears to be preoccupied.  He denies SI, HI, or AVH.  He further denies suicidal intent, plans, or prior suicide attempt.  He is able to contract for safety while in the hospital.  Vital signs reviewed without critical values.  Labs and EKG reviewed as indicated in the treatment plan.  Patient is admitted for safety, medication management, and stabilization.   Mode of transport to Hospital: Current Outpatient (Home) Medication List: See medication list PRN medication prior to evaluation: See home medication listing   ED course: Labs and EKG were obtained and analyzed Collateral Information: Not obtained at this time POA/Legal Guardian: Patient is his own legal guardian  Principal Problem: MDD (major depressive disorder), recurrent episode, severe (HCC) Discharge Diagnoses: Principal Problem:   MDD (major depressive disorder), recurrent episode, severe (HCC)   Past Psychiatric History:  Previous Psych Diagnoses: MDD without psychotic features, generalized anxiety disorder Prior inpatient  treatment: Denies Current/prior outpatient treatment: Reports seeing a therapist 2 years ago Prior rehab hx: Denies Psychotherapy hx: Years History of suicide: Denies History of homicide or aggression: Denies Psychiatric medication history: Before being on trial of Lexapro , and viibryd  2 years ago Neuromodulation history: He denies Current Psychiatrist: Denies Current therapist: Denies   Substance Abuse Hx: Alcohol: Denies Tobacco: Denies Illicit drugs: Denies Rx drug abuse: Denies Rehab hx: Denies  Past Medical History:  Past Medical History:  Diagnosis Date   ADD (attention deficit disorder)    Alcohol abuse    Anxiety    Back pain    Bursitis of both knees    Degenerative disc disease, lumbar    Depression    Drug use    Fatty liver    Floppy eyelid syndrome    GERD (gastroesophageal reflux disease)    Hyperlipidemia    Hypertension    Obesity    Plantar fasciitis    followed by ortho   Sleep apnea    SOB (shortness of breath)     Past Surgical History:  Procedure Laterality Date   US  ECHOCARDIOGRAPHY  08/2010   normal, no LVH   Family History:  Family History  Problem Relation Age of Onset   Anxiety disorder Mother    Alcohol abuse Mother    Diabetes Mother    Depression Mother    Sleep apnea Mother    Alcoholism Mother    Obesity Mother    ADD / ADHD Father    OCD Father    Hyperlipidemia Father    Anxiety disorder Father    Hypertension Maternal Grandfather    CAD Maternal Grandfather 30       MI, CABG   CAD Paternal Grandfather 23   Depression Paternal Grandmother    Anxiety disorder Paternal Grandmother    Colon cancer Neg Hx    Prostate cancer Neg Hx    Family Psychiatric  History:   Social History:  Social History   Substance and Sexual Activity  Alcohol Use No     Social History   Substance and Sexual Activity  Drug Use No    Social History   Socioeconomic History   Marital status: Married    Spouse name: Lauraine   Number  of children: 2   Years of education: 18   Highest education level: Master's degree (e.g., MA, MS, MEng, MEd, MSW, MBA)  Occupational History   Occupation: [employer], jewellery: Warden/ranger    Comment: currently seeking employment  Tobacco Use   Smoking status: Never   Smokeless tobacco: Never  Substance and Sexual Activity   Alcohol use: No   Drug use: No   Sexual activity: Yes  Other Topics Concern   Not on file  Social History Narrative   Lives in [place] with wife and two children.    Married 2011   2 sons (born 2012 and 2015)   Social Drivers of Health   Financial Resource Strain: Not on file  Food Insecurity: No Food Insecurity (12/29/2023)   Hunger Vital Sign    Worried About Running Out of Food in the Last Year: Never true    Ran Out of Food in the Last  Year: Never true  Transportation Needs: No Transportation Needs (12/29/2023)   PRAPARE - Administrator, Civil Service (Medical): No    Lack of Transportation (Non-Medical): No  Physical Activity: Not on file  Stress: Not on file  Social Connections: Unknown (12/29/2023)   Social Connection and Isolation Panel    Frequency of Communication with Friends and Family: Three times a week    Frequency of Social Gatherings with Friends and Family: Once a week    Attends Religious Services: Not on Marketing executive or Organizations: Not on file    Attends Banker Meetings: Not on file    Marital Status: Married    Hospital Course:   Patient was admitted on suicide precautions.  Escitalopram  was introduced during his hospital stay.  He responded well to this medication.  Mood lifted.  Patient maintained a normal sleep-wake cycle.  Patient attended to his basic needs.  He did not require any psychiatric or medical emergency measures during his hospital stay.  He participated with unit groups and therapeutic activities.  Seen today.  In good spirits.   Looks forward to discharge.  Not expressing any worries or any concerns.  No suicidal thoughts.  No homicidal thoughts.  No thoughts of violence.  No residual symptoms of depression.  No evidence of mania.  No evidence of psychosis.  Nursing staff reports that patient has been appropriate on the unit. Patient has been interacting well with peers. No behavioral issues. Patient has not voiced any suicidal thoughts. Patient has not been observed to be internally stimulated. Patient has been adherent with treatment recommendations. Patient has been tolerating their medication well.   Family, patient and team agrees that he is back to his baseline.  Team agrees with discharge today.  Physical Findings: AIMS:  , ,  ,  ,  ,  ,   CIWA:    COWS:     Musculoskeletal: Strength & Muscle Tone: within normal limits Gait & Station: normal Patient leans: N/A   Psychiatric Specialty Exam:  Presentation  General Appearance:  Casually dressed, not in any distress, appropriate behavior, engaged politely.  No EPS.  Eye Contact: Good.  Speech: Spontaneous.  Normal rate, tone and volume.  Normal prosody of speech.  Mood and Affect  Mood: Euthymic.  Affect: Full range and appropriate.  Thought Process  Thought Processes: Linear and goal directed.  Descriptions of Associations:Intact  Orientation:Full (Time, Place and Person)  Thought Content: Future oriented.  No current suicidal thoughts.  No homicidal thoughts.  No thoughts of violence.  No negative ruminative flooding.  No guilty ruminations.  No delusional theme.  No obsessions.  Hallucinations: No hallucination in any modality.  Sensorium  Memory: Good.  Judgment: Good.  Insight: Good  Executive Functions  Concentration: Good.  Attention Span: Good.  Recall: Good.  Fund of Knowledge: Good.  Language: Good   Psychomotor Activity  Normal psychomotor activity    Physical Exam: Physical Exam ROS Blood  pressure 134/89, pulse 83, temperature 98.9 F (37.2 C), temperature source Oral, resp. rate 20, height 5' 7.5 (1.715 m), weight 93.4 kg, SpO2 98%. Body mass index is 31.79 kg/m.   Social History   Tobacco Use  Smoking Status Never  Smokeless Tobacco Never   Tobacco Cessation:  N/A, patient does not currently use tobacco products   Blood Alcohol level:  Lab Results  Component Value Date   [facility] <15 12/29/2023    Metabolic  Disorder Labs:  Lab Results  Component Value Date   HGBA1C 4.9 12/29/2023   MPG 93.93 12/29/2023   MPG 103 07/07/2021   No results found for: PROLACTIN Lab Results  Component Value Date   CHOL 215 (H) 12/29/2023   TRIG 75 12/29/2023   HDL 56 12/29/2023   CHOLHDL 3.8 12/29/2023   VLDL 15 12/29/2023   LDLCALC 144 (H) 12/29/2023   LDLCALC 162 (H) 09/01/2023    See Psychiatric Specialty Exam and Suicide Risk Assessment completed by Attending Physician prior to discharge.  Discharge destination:  Home  Is patient on multiple antipsychotic therapies at discharge:  No   Has Patient had three or more failed trials of antipsychotic monotherapy by history:  No  Recommended Plan for Multiple Antipsychotic Therapies: NA  Discharge Instructions     Diet - low sodium heart healthy   Complete by: As directed    Increase activity slowly   Complete by: As directed       Allergies as of 01/01/2024   No Known Allergies      Medication List     STOP taking these medications    meclizine  25 MG tablet Commonly known as: ANTIVERT        TAKE these medications      Indication  escitalopram  10 MG tablet Commonly known as: LEXAPRO  Take 1 tablet (10 mg total) by mouth daily. Start taking on: January 02, 2024  Indication: Major Depressive Disorder   rosuvastatin  40 MG tablet Commonly known as: CRESTOR  Take 1 tablet (40 mg total) by mouth daily.  Indication: High Amount of Fats in the Blood   Vitamin D  (Ergocalciferol ) 1.25 MG (50000 UNIT) Caps  capsule Commonly known as: DRISDOL  Take 1 capsule (50,000 Units total) by mouth every 7 (seven) days.  Indication: Vitamin D  Deficiency   Zepbound  12.5 MG/0.5ML Pen Generic drug: tirzepatide  Inject 12.5 mg into the skin once a week.  Indication: OBESITY        Follow-up Information     [facility]. Go on 01/09/2024.   Specialty: Behavioral Health Why: You have an assessment rappointment for therapy services on 01/09/24 at 11:00 am, in person.  At this time, the provider will schedule an appointment for medication management services. * Please bring your insurance card with you. Contact information: [address] [place] [address] [phone]                 Follow-up recommendations:   Patient will stay on medication as recommended.  Patient will follow up as recommended.  No restrictions with respect to diet.  No restrictions with respect to level of activity.  Signed: Jerrell DELENA Forehand, MD 01/01/2024, 3:53 PM

## 2024-01-01 NOTE — Progress Notes (Signed)
   01/01/24 0800  Psych Admission Type (Psych Patients Only)  Admission Status Voluntary  Psychosocial Assessment  Patient Complaints Anxiety  Eye Contact Fair  Facial Expression Anxious  Affect Appropriate to circumstance  Speech Logical/coherent  Interaction Assertive  Motor Activity Other (Comment) (steady gait)  Appearance/Hygiene Unremarkable  Behavior Characteristics Cooperative;Appropriate to situation  Mood Pleasant  Thought Process  Coherency WDL  Content WDL  Delusions None reported or observed  Perception WDL  Hallucination None reported or observed  Judgment WDL  Confusion None  Danger to Self  Current suicidal ideation? Denies  Danger to Others  Danger to Others None reported or observed

## 2024-01-01 NOTE — [facility] Suicide Risk Assessment (Signed)
 [facility] Discharge Suicide Risk Assessment   Principal Problem: MDD (major depressive disorder), recurrent episode, severe (HCC) Discharge Diagnoses: Principal Problem:   MDD (major depressive disorder), recurrent episode, severe (HCC)   Total Time spent with patient: 30 minutes  Musculoskeletal: Strength & Muscle Tone: within normal limits Gait & Station: normal Patient leans: N/A  Psychiatric Specialty Exam  Presentation  General Appearance:  Casually dressed, not in any distress, appropriate behavior, engaged politely.  No EPS.  Eye Contact: Good.  Speech: Spontaneous.  Normal rate, tone and volume.  Normal prosody of speech.  Mood and Affect  Mood: Euthymic.  Affect: Full range and appropriate.  Thought Process  Thought Processes: Linear and goal directed.  Descriptions of Associations:Intact  Orientation:Full (Time, Place and Person)  Thought Content: Future oriented.  No current suicidal thoughts.  No homicidal thoughts.  No thoughts of violence.  No negative ruminative flooding.  No guilty ruminations.  No delusional theme.  No obsessions.  Hallucinations: No hallucination in any modality.  Sensorium  Memory: Good.  Judgment: Good.  Insight: Good  Executive Functions  Concentration: Good.  Attention Span: Good.  Recall: Good.  Fund of Knowledge: Good.  Language: Good   Psychomotor Activity  Normal psychomotor activity   Physical Exam: Physical Exam ROS Blood pressure 134/89, pulse 83, temperature 98.9 F (37.2 C), temperature source Oral, resp. rate 20, height 5' 7.5 (1.715 m), weight 93.4 kg, SpO2 98%. Body mass index is 31.79 kg/m.  Mental Status Per Nursing Assessment::   On Admission:  NA  Demographic Factors:  Male and Caucasian  Loss Factors: NA  Historical Factors: Family history of mental illness or substance abuse  Risk Reduction Factors:   Sense of responsibility to family, Employed, Living with another  person, especially a relative, Positive social support, Positive therapeutic relationship, and Positive coping skills or problem solving skills  Continued Clinical Symptoms:  Patient is back on antidepressant.  He has responded well to treatment.  Depression has lifted.  He is tolerating his medication well.  Cognitive Features That Contribute To Risk:  None    Suicide Risk:  Minimal: No current suicidal thoughts.  No current homicidal thoughts.  No thoughts of violence.  Modifiable risk factor targeted during this admission as unipolar depression.  Patient is responding well to his antidepressant medication.  His family remains supportive.  No new psychosocial stressor.  Sleep-wake cycle is well regularized.  Other biological functions are well regularized.  No substance use disorder. Patient is stable for care at the lower setting.  Follow-up Information     [facility]. Go on 01/09/2024.   Specialty: Behavioral Health Why: You have an assessment rappointment for therapy services on 01/09/24 at 11:00 am, in person.  At this time, the provider will schedule an appointment for medication management services. * Please bring your insurance card with you. Contact information: [address] [place] [address] [phone]                 Plan Of Care/Follow-up recommendations:  See discharge summary  Jerrell DELENA Forehand, MD 01/01/2024, 10:01 AM

## 2024-01-01 NOTE — [facility] Suicide Risk Assessment (Signed)
 [facility] INPATIENT:  Family/Significant Other Suicide Prevention Education  Suicide Prevention Education:  Education Completed; Alejandro [name], Alejandro [name], [phone] ,  (name of family member/significant other) has been identified by the patient as the family member/significant other with whom the patient will be residing, and identified as the person(s) who will aid the patient in the event of a mental health crisis (suicidal ideations/suicide attempt).  With written consent from the patient, the family member/significant other has been provided the following suicide prevention education, prior to the and/or following the discharge of the patient.  The suicide prevention education provided includes the following: Suicide risk factors Suicide prevention and interventions National Suicide Hotline telephone number [facility] assessment telephone number [facility] Emergency Assistance 911 [facility] and/or Residential Mobile Crisis Unit telephone number  Request made of family/significant other to: Remove weapons (e.g., guns, rifles, knives), all items previously/currently identified as safety concern.   Remove drugs/medications (over-the-counter, prescriptions, illicit drugs), all items previously/currently identified as a safety concern.  The family member/significant other verbalizes understanding of the suicide prevention education information provided.  The family member/significant other agrees to remove the items of safety concern listed above.  Alejandro [name] 01/01/2024, 10:10 AM

## 2024-01-02 ENCOUNTER — Other Ambulatory Visit (HOSPITAL_COMMUNITY): Payer: Self-pay

## 2024-01-03 ENCOUNTER — Other Ambulatory Visit (HOSPITAL_COMMUNITY): Payer: Self-pay

## 2024-01-10 ENCOUNTER — Telehealth: Payer: Self-pay | Admitting: Family Medicine

## 2024-01-10 ENCOUNTER — Other Ambulatory Visit (HOSPITAL_COMMUNITY): Payer: Self-pay

## 2024-01-10 ENCOUNTER — Ambulatory Visit (INDEPENDENT_AMBULATORY_CARE_PROVIDER_SITE_OTHER): Admitting: Adult Health

## 2024-01-10 ENCOUNTER — Encounter (INDEPENDENT_AMBULATORY_CARE_PROVIDER_SITE_OTHER): Payer: Self-pay | Admitting: Adult Health

## 2024-01-10 VITALS — BP 115/80 | HR 69 | Temp 98.3°F | Ht 67.5 in | Wt 212.0 lb

## 2024-01-10 DIAGNOSIS — F419 Anxiety disorder, unspecified: Secondary | ICD-10-CM

## 2024-01-10 DIAGNOSIS — R7303 Prediabetes: Secondary | ICD-10-CM

## 2024-01-10 DIAGNOSIS — Z6831 Body mass index (BMI) 31.0-31.9, adult: Secondary | ICD-10-CM

## 2024-01-10 DIAGNOSIS — F32A Depression, unspecified: Secondary | ICD-10-CM

## 2024-01-10 DIAGNOSIS — Z6832 Body mass index (BMI) 32.0-32.9, adult: Secondary | ICD-10-CM

## 2024-01-10 DIAGNOSIS — E669 Obesity, unspecified: Secondary | ICD-10-CM

## 2024-01-10 DIAGNOSIS — E559 Vitamin D deficiency, unspecified: Secondary | ICD-10-CM | POA: Diagnosis not present

## 2024-01-10 MED ORDER — WEGOVY 0.5 MG/0.5ML ~~LOC~~ SOAJ
0.5000 mg | SUBCUTANEOUS | 0 refills | Status: DC
  Filled 2024-01-10: qty 2, 28d supply, fill #0

## 2024-01-10 NOTE — Telephone Encounter (Signed)
 Received another CRM stating Patient called back stating [employer] will re-faxing forms today. Short term disability forms need to be faxed back [employer] by 01/13/24. Call back # [phone]

## 2024-01-10 NOTE — Progress Notes (Signed)
 WEIGHT SUMMARY AND BIOMETRICS  Vitals Temp: 98.3 F (36.8 C) BP: 115/80 Pulse Rate: 69 SpO2: 98 %   Anthropometric Measurements Height: 5' 7.5 (1.715 m) Weight: 212 lb (96.2 kg) BMI (Calculated): 32.69 Weight at Last Visit: 206 lb Weight Lost Since Last Visit: 0 Weight Gained Since Last Visit: 6 lb Starting Weight: 254 lb Total Weight Loss (lbs): 42 lb (19.1 kg)   Body Composition  Body Fat %: 24.1 % Fat Mass (lbs): 51.2 lbs Muscle Mass (lbs): 153 lbs Total Body Water (lbs): 110 lbs Visceral Fat Rating : 12   Other Clinical Data Fasting: no Labs: no Today's Visit #: 49 Starting Date: 04/09/19    Chief Complaint:   OBESITY Alejandro [name] is here to discuss his progress with his obesity treatment plan.  He is on the the Category 4 Plan +200 and states he is following his eating plan approximately 0 % of the time.  He states he is exercising: NEAT Activities  Interim History:  Profound work stress that led to PCP evaluation and subsequent admittance for [facility]- [place]  Reviewed all encounter notes/labs over the last 2 weeks  He endorses stable mood and supportive family/friends.  He has established with new therapist- weekly OVs  He has submitted FMLA and Short Term Disability paperwork.  Alejandro [name] endorses the following  challenges with weight loss: -Planning meals for the week -Making a grocery list for planned meals -Grocery shopping/order online for the week -Family sabotage/temptations -Differentiating hunger from cravings -Not prioritizing healthy eating/exercise  Subjective:   1. Prediabetes Lab Results  Component Value Date   HGBA1C 4.9 12/29/2023   HGBA1C 5.4 09/01/2023   HGBA1C 5.8 (H) 11/17/2022    He has been off Zepbound  12.5mg  for > 4 weeks When on GIP/GLP-1 therapy he denies worsening anxiety/depression He reports increased cravings He denies family hx of MENS 2 or MTC He denies personal hx of  pancreatitis He would like restart GIP-1 or GIP/GLP-1 therapy. Appears that his insurance will not longer cover Zepbound , will attempt Wegovy   2. Vitamin D  deficiency  Latest Reference Range & Units 11/17/22 07:37 09/01/23 08:20 12/29/23 10:03  Vitamin D , 25-Hydroxy 30 - 100 ng/mL 40.0 33.1 48.16   He is on weekly Ergocaciferol  3. Anxiety and depression Reviewed PCP, Behavioral Health notes at length  He VEHEMENTLY denies SI/HI  He is stable on Lexapro  10mg   Assessment/Plan:   1. Prediabetes Strive for 30g protein at each meal Remain off Zepbpund, start weekly Wegovy  0.5mg   2. Vitamin D  deficiency Continue weekly Ergocalciferol - does not require refill today  3. Anxiety and depression Remain on Lexapro  and close f/u psychology F/u with PCP as needed Surround self with supportive family/friends Increase regular exercise  4. BMI 31.0-31.9,adult, CURRENT BMI 32.7 Remain off Zepbound  Start  Semaglutide -Weight Management (WEGOVY ) 0.5 MG/0.5ML SOAJ Inject 0.5 mg into the skin once a week. Dispense: 2 mL, Refills: 0 of 0 remaining   If not covered by insurance- send MyChart message  Alejandro [name] is not currently in the action stage of change. As such, his goal is to maintain weight for now. He has agreed to 30g protein per meal.  Exercise goals: All adults should avoid inactivity. Some physical activity is better than none, and adults who participate in any amount of physical activity gain some health benefits. Adults should also include muscle-strengthening activities that involve all major muscle groups on 2 or more days a week. Daily Walking and yoga practice  2 x week.  Behavioral modification strategies: increasing lean protein intake, decreasing simple carbohydrates, increasing vegetables, increasing water intake, meal planning and cooking strategies, keeping healthy foods in the home, ways to avoid boredom eating, ways to avoid night time snacking, and planning for  success.  [name] has agreed to follow-up with our clinic in 4 weeks. He was informed of the importance of frequent follow-up visits to maximize his success with intensive lifestyle modifications for his multiple health conditions.    Objective:   Blood pressure 115/80, pulse 69, temperature 98.3 F (36.8 C), height 5' 7.5 (1.715 m), weight 212 lb (96.2 kg), SpO2 98%. Body mass index is 32.71 kg/m.  General: Cooperative, alert, well developed, in no acute distress. HEENT: Conjunctivae and lids unremarkable. Cardiovascular: Regular rhythm.  Lungs: Normal work of breathing. Neurologic: No focal deficits.   Lab Results  Component Value Date   CREATININE 0.80 12/29/2023   BUN 15 12/29/2023   NA 140 12/29/2023   K 4.3 12/29/2023   CL 104 12/29/2023   CO2 25 12/29/2023   Lab Results  Component Value Date   ALT 24 12/29/2023   AST 20 12/29/2023   ALKPHOS 49 12/29/2023   BILITOT 0.8 12/29/2023   Lab Results  Component Value Date   HGBA1C 4.9 12/29/2023   HGBA1C 5.4 09/01/2023   HGBA1C 5.8 (H) 11/17/2022   HGBA1C 5.7 (H) 04/21/2022   HGBA1C 5.2 07/07/2021   Lab Results  Component Value Date   INSULIN  21.5 09/01/2023   INSULIN  30.3 (H) 11/17/2022   INSULIN  14.6 04/21/2022   INSULIN  25.5 (H) 02/03/2021   INSULIN  14.2 06/04/2020   Lab Results  Component Value Date   TSH 1.563 12/29/2023   Lab Results  Component Value Date   CHOL 215 (H) 12/29/2023   HDL 56 12/29/2023   LDLCALC 144 (H) 12/29/2023   LDLDIRECT 224.0 08/26/2017   TRIG 75 12/29/2023   CHOLHDL 3.8 12/29/2023   Lab Results  Component Value Date   VD25OH 48.16 12/29/2023   VD25OH 33.1 09/01/2023   VD25OH 40.0 11/17/2022   Lab Results  Component Value Date   WBC 5.6 12/29/2023   HGB 16.2 12/29/2023   HCT 46.8 12/29/2023   MCV 86.2 12/29/2023   PLT 430 (H) 12/29/2023   No results found for: IRON, TIBC, FERRITIN  Attestation Statements:   Reviewed by clinician on day of visit:  allergies, medications, problem list, medical history, surgical history, family history, social history, and previous encounter notes.  I have reviewed the above documentation for accuracy and completeness, and I agree with the above. -  Kamarion Zagami d. Valene Villa, NP-C

## 2024-01-10 NOTE — Telephone Encounter (Signed)
 Copied from CRM [id number]. Topic: General - Other >> Jan 10, 2024  8:37 AM Emylou G wrote: Reason for CRM: Patient called.. said [employer] for Short term disability.. will be faxing over paperwork.. they sent it last week? But he will get them to resend

## 2024-01-11 ENCOUNTER — Other Ambulatory Visit (HOSPITAL_COMMUNITY): Payer: Self-pay

## 2024-01-11 NOTE — Telephone Encounter (Signed)
 Placed in your tray

## 2024-01-11 NOTE — Telephone Encounter (Signed)
 I was out of the office last week.  I haven't seen patient and I don't have information to complete the form- I don't have info re: compliance with meds, status, etc.  Please send a note that the patient required inpatient treatment and please get him scheduled for f/u OV here ASAP.  Thanks.

## 2024-01-12 ENCOUNTER — Encounter: Payer: Self-pay | Admitting: Family Medicine

## 2024-01-12 NOTE — Telephone Encounter (Signed)
 Letter faxed to [employer] and also sent to patient via mychart.

## 2024-01-12 NOTE — Telephone Encounter (Signed)
 Spoke with patient to advise that he would need to be seen for an office visit. I did schedule the patient to be seen on 01/20/24.(Later an appointment opened for 01/16/24) Patient was concerned that he was going to be denied his disability. I did advise him that I will reach out to [employer]. I spoke with Joe at [employer] who advised that yes the form is due 01/17/2024. Patient is scheduled 01/16/24 but I will send a letter letting them know that the patient will have to be seen before paperwork can be completed but he was in patient 7/17 - 7/20 and he is scheduled to be seen 01/16/24

## 2024-01-16 ENCOUNTER — Other Ambulatory Visit (HOSPITAL_COMMUNITY): Payer: Self-pay

## 2024-01-16 ENCOUNTER — Encounter: Payer: Self-pay | Admitting: Family Medicine

## 2024-01-16 ENCOUNTER — Ambulatory Visit (INDEPENDENT_AMBULATORY_CARE_PROVIDER_SITE_OTHER): Admitting: Family Medicine

## 2024-01-16 VITALS — BP 112/58 | HR 71 | Temp 98.2°F | Ht 67.5 in | Wt 213.0 lb

## 2024-01-16 DIAGNOSIS — F418 Other specified anxiety disorders: Secondary | ICD-10-CM | POA: Diagnosis not present

## 2024-01-16 MED ORDER — ESCITALOPRAM OXALATE 10 MG PO TABS
10.0000 mg | ORAL_TABLET | Freq: Every day | ORAL | 1 refills | Status: AC
  Filled 2024-01-16: qty 90, 90d supply, fill #0
  Filled 2024-03-06: qty 30, 30d supply, fill #0
  Filled 2024-06-18: qty 30, 30d supply, fill #1

## 2024-01-16 NOTE — Patient Instructions (Signed)
 Plan on recheck in about 6 weeks, please update me in about 2 weeks by phone, sooner if needed . Take care.  Glad to see you. Keep taking lexapro  and keep going with counseling.

## 2024-01-16 NOTE — Progress Notes (Unsigned)
 Work situation d/w pt.  Multiple complaints about his boss, multiple people have quit.  He was having panic attacks, mood was depressed.  He had to block the phone number of his boss to limit contact.    He is still employed but out on short term disability.  On lexapro  10mg .  Was on med years ago.  Unclear if some benefit from med vs being out of work vs both.  No SI/HI.  He hasn't had time for med to have full effect.    He is still in counseling.  He was admitted to inpatient treatment for depression and panic.  He still has ongoing outpatient treatment.  When he has a panic attack he gets anxious and tearful.  He can have shortness of breath and starts sweating and get chest tightness.  See hardcopy forms that have been faxed.  Meds, vitals, and allergies reviewed.   ROS: Per HPI unless specifically indicated in ROS section   GEN: nad, alert and oriented HEENT: ncat NECK: supple w/o LA CV: rrr.  PULM: ctab, no inc wob ABD: soft, +bs EXT: no edema SKIN: Well-perfused. Speech and judgment appear intact. No tremor.

## 2024-01-17 NOTE — Telephone Encounter (Unsigned)
 Copied from CRM #[id number]. Topic: General - Other >> Jan 17, 2024  9:46 AM Lavanda D wrote: Reason for CRM: Patient is calling because [facility] has not yet received the fax yet, he wants to verify that is was sent to the right fax #: [phone] Please sent a message via MyChart to confirm.

## 2024-01-18 NOTE — Telephone Encounter (Unsigned)
 Copied from CRM [id number]. Topic: General - Other >> Jan 18, 2024  3:41 PM Harlene ORN wrote: Reason for CRM: Patient called to check if his Short Term Paperwork was sent to [employer]
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
## 2024-01-20 ENCOUNTER — Ambulatory Visit: Admitting: Family Medicine

## 2024-01-25 ENCOUNTER — Other Ambulatory Visit (HOSPITAL_COMMUNITY): Payer: Self-pay

## 2024-01-25 MED ORDER — HYDROXYZINE PAMOATE 25 MG PO CAPS
25.0000 mg | ORAL_CAPSULE | Freq: Two times a day (BID) | ORAL | 0 refills | Status: AC
  Filled 2024-01-25 (×2): qty 60, 30d supply, fill #0
  Filled 2024-03-06: qty 60, 30d supply, fill #1

## 2024-01-25 MED ORDER — ESCITALOPRAM OXALATE 20 MG PO TABS
20.0000 mg | ORAL_TABLET | Freq: Every day | ORAL | 0 refills | Status: DC
  Filled 2024-01-25 (×2): qty 30, 30d supply, fill #0
  Filled 2024-03-06: qty 30, 30d supply, fill #1

## 2024-01-25 MED ORDER — TRAZODONE HCL 50 MG PO TABS
50.0000 mg | ORAL_TABLET | Freq: Every evening | ORAL | 0 refills | Status: AC | PRN
  Filled 2024-01-25 (×2): qty 60, 30d supply, fill #0

## 2024-01-26 ENCOUNTER — Other Ambulatory Visit (HOSPITAL_COMMUNITY): Payer: Self-pay

## 2024-02-20 ENCOUNTER — Ambulatory Visit (INDEPENDENT_AMBULATORY_CARE_PROVIDER_SITE_OTHER): Admitting: Adult Health

## 2024-03-06 ENCOUNTER — Encounter (HOSPITAL_COMMUNITY): Payer: Self-pay

## 2024-03-06 ENCOUNTER — Other Ambulatory Visit: Payer: Self-pay

## 2024-03-06 ENCOUNTER — Other Ambulatory Visit (INDEPENDENT_AMBULATORY_CARE_PROVIDER_SITE_OTHER): Payer: Self-pay | Admitting: Adult Health

## 2024-03-06 ENCOUNTER — Encounter (INDEPENDENT_AMBULATORY_CARE_PROVIDER_SITE_OTHER): Payer: Self-pay | Admitting: Adult Health

## 2024-03-06 ENCOUNTER — Other Ambulatory Visit (HOSPITAL_COMMUNITY): Payer: Self-pay

## 2024-03-06 DIAGNOSIS — E782 Mixed hyperlipidemia: Secondary | ICD-10-CM

## 2024-03-12 ENCOUNTER — Ambulatory Visit (INDEPENDENT_AMBULATORY_CARE_PROVIDER_SITE_OTHER): Admitting: Adult Health

## 2024-03-12 ENCOUNTER — Encounter (INDEPENDENT_AMBULATORY_CARE_PROVIDER_SITE_OTHER): Payer: Self-pay | Admitting: Adult Health

## 2024-03-12 ENCOUNTER — Other Ambulatory Visit (HOSPITAL_COMMUNITY): Payer: Self-pay

## 2024-03-12 VITALS — BP 107/72 | HR 76 | Temp 98.2°F | Ht 67.5 in | Wt 218.0 lb

## 2024-03-12 DIAGNOSIS — E782 Mixed hyperlipidemia: Secondary | ICD-10-CM | POA: Diagnosis not present

## 2024-03-12 DIAGNOSIS — Z6831 Body mass index (BMI) 31.0-31.9, adult: Secondary | ICD-10-CM

## 2024-03-12 DIAGNOSIS — E559 Vitamin D deficiency, unspecified: Secondary | ICD-10-CM | POA: Diagnosis not present

## 2024-03-12 DIAGNOSIS — R7303 Prediabetes: Secondary | ICD-10-CM

## 2024-03-12 DIAGNOSIS — F418 Other specified anxiety disorders: Secondary | ICD-10-CM

## 2024-03-12 DIAGNOSIS — Z6833 Body mass index (BMI) 33.0-33.9, adult: Secondary | ICD-10-CM

## 2024-03-12 MED ORDER — VITAMIN D (ERGOCALCIFEROL) 1.25 MG (50000 UNIT) PO CAPS
50000.0000 [IU] | ORAL_CAPSULE | ORAL | 0 refills | Status: DC
Start: 2024-03-12 — End: 2024-04-02
  Filled 2024-03-12: qty 5, 35d supply, fill #0

## 2024-03-12 MED ORDER — WEGOVY 0.5 MG/0.5ML ~~LOC~~ SOAJ
0.5000 mg | SUBCUTANEOUS | 0 refills | Status: DC
  Filled 2024-03-12: qty 2, 28d supply, fill #0

## 2024-03-12 MED ORDER — ROSUVASTATIN CALCIUM 40 MG PO TABS
40.0000 mg | ORAL_TABLET | Freq: Every day | ORAL | 0 refills | Status: AC
  Filled 2024-03-12: qty 90, 90d supply, fill #0

## 2024-03-12 NOTE — Progress Notes (Unsigned)
 WEIGHT SUMMARY AND BIOMETRICS  No data recorded Anthropometric Measurements Height: 5' 7.5 (1.715 m) Weight at Last Visit: 212 lb Starting Weight: 254 lb   No data recorded Other Clinical Data Fasting: no Labs: no Today's Visit #: 73    Chief Complaint:   OBESITY Alejandro [name] is here to discuss his progress with his obesity treatment plan.  He is on the the Category 4 Plan +200 and states he is following his eating plan approximately 20 % of the time.  He states he is exercising Yoga and Walking 60 minutes 4 times per week.   Interim History:  He has been off Zepbound  12.5mg  since summer 2025 When on GIP/GLP-1 therapy he denies worsening anxiety/depression  He denies family hx of MENS 2 or MTC He denies personal hx of pancreatitis He would like restart GIP-1 or GIP/GLP-1 therapy.  01/10/2024 he was restarted on weekly Wegovy  0.25mg   Hunger/appetite-*** Cravings- *** Stress- *** Sleep- *** Exercise-*** Hydration-***  Subjective:   1. Prediabetes ***   2. Mixed hyperlipidemia ***  3. Vitamin D  deficiency  Latest Reference Range & Units 11/17/22 07:37 09/01/23 08:20 12/29/23 10:03  Vitamin D , 25-Hydroxy 30 - 100 ng/mL 40.0 33.1 48.16   4. Depression with anxiety ***  Assessment/Plan:   1. Prediabetes Restart semaglutide -weight management (WEGOVY ) 0.5 MG/0.5ML SOAJ SQ injection Inject 0.5 mg into the skin once a week. Dispense: 2 mL, Refills: 0 of 0 remaining   2. Mixed hyperlipidemia Refill []   rosuvastatin  (CRESTOR ) 40 MG tablet Take 1 tablet (40 mg total) by mouth daily. Dispense: 90 tablet, Refills: 0 of 0 remaining   3. Vitamin D  deficiency Refill Vitamin D , Ergocalciferol , (DRISDOL ) 1.25 MG (50000 UNIT) CAPS capsule Take 1 capsule (50,000 Units total) by mouth every 7 (seven) days. Dispense: 5 capsule, Refills: 0 ordered   4. Depression with anxiety (Primary) ***  5. BMI 31.0-31.9,adult, CURRENT BMI 33.7 Restart semaglutide -weight  management (WEGOVY ) 0.5 MG/0.5ML SOAJ SQ injection Inject 0.5 mg into the skin once a week. Dispense: 2 mL, Refills: 0 of 0 remaining   Alejandro [name] {CHL AMB IS/IS NOT:210130109} currently in the action stage of change. As such, his goal is to {MWMwtloss#1:210800005}. He has agreed to {MWMwtlossportion/plan2:23431}.   Exercise goals: {MWM EXERCISE RECS:23473}  Behavioral modification strategies: {MWMwtlossdietstrategies3:23432}.  Alejandro [name] has agreed to follow-up with our clinic in {NUMBER 1-10:22536} weeks. He was informed of the importance of frequent follow-up visits to maximize his success with intensive lifestyle modifications for his multiple health conditions.   Check Fasting Labs fall 2025  Objective:   Height 5' 7.5 (1.715 m). Body mass index is 32.87 kg/m.  General: Cooperative, alert, well developed, in no acute distress. HEENT: Conjunctivae and lids unremarkable. Cardiovascular: Regular rhythm.  Lungs: Normal work of breathing. Neurologic: No focal deficits.   Lab Results  Component Value Date   CREATININE 0.80 12/29/2023   BUN 15 12/29/2023   NA 140 12/29/2023   K 4.3 12/29/2023   CL 104 12/29/2023   CO2 25 12/29/2023   Lab Results  Component Value Date   ALT 24 12/29/2023   AST 20 12/29/2023   ALKPHOS 49 12/29/2023   BILITOT 0.8 12/29/2023   Lab Results  Component Value Date   HGBA1C 4.9 12/29/2023   HGBA1C 5.4 09/01/2023   HGBA1C 5.8 (H) 11/17/2022   HGBA1C 5.7 (H) 04/21/2022   HGBA1C 5.2 07/07/2021   Lab Results  Component Value Date   INSULIN  21.5 09/01/2023   INSULIN  30.3 (H) 11/17/2022  INSULIN  14.6 04/21/2022   INSULIN  25.5 (H) 02/03/2021   INSULIN  14.2 06/04/2020   Lab Results  Component Value Date   TSH 1.563 12/29/2023   Lab Results  Component Value Date   CHOL 215 (H) 12/29/2023   HDL 56 12/29/2023   LDLCALC 144 (H) 12/29/2023   LDLDIRECT 224.0 08/26/2017   TRIG 75 12/29/2023   CHOLHDL 3.8 12/29/2023   Lab Results  Component  Value Date   VD25OH 48.16 12/29/2023   VD25OH 33.1 09/01/2023   VD25OH 40.0 11/17/2022   Lab Results  Component Value Date   WBC 5.6 12/29/2023   HGB 16.2 12/29/2023   HCT 46.8 12/29/2023   MCV 86.2 12/29/2023   PLT 430 (H) 12/29/2023   No results found for: IRON, TIBC, FERRITIN  Attestation Statements:   Reviewed by clinician on day of visit: allergies, medications, problem list, medical history, surgical history, family history, social history, and previous encounter notes.  I have reviewed the above documentation for accuracy and completeness, and I agree with the above. -  Chyanna Flock d. Recie Cirrincione, NP-C

## 2024-03-21 ENCOUNTER — Other Ambulatory Visit (HOSPITAL_COMMUNITY): Payer: Self-pay

## 2024-03-21 MED ORDER — TRAZODONE HCL 50 MG PO TABS
50.0000 mg | ORAL_TABLET | Freq: Every evening | ORAL | 0 refills | Status: AC | PRN
  Filled 2024-03-21: qty 30, 30d supply, fill #0
  Filled 2024-04-17: qty 30, 30d supply, fill #1
  Filled 2024-05-22: qty 30, 30d supply, fill #2

## 2024-03-21 MED ORDER — ESCITALOPRAM OXALATE 20 MG PO TABS
20.0000 mg | ORAL_TABLET | Freq: Every day | ORAL | 0 refills | Status: DC
  Filled 2024-03-21: qty 30, 30d supply, fill #0
  Filled 2024-05-22: qty 30, 30d supply, fill #1
  Filled 2024-06-18: qty 30, 30d supply, fill #2

## 2024-04-02 ENCOUNTER — Encounter (INDEPENDENT_AMBULATORY_CARE_PROVIDER_SITE_OTHER): Payer: Self-pay | Admitting: Adult Health

## 2024-04-02 ENCOUNTER — Other Ambulatory Visit (HOSPITAL_COMMUNITY): Payer: Self-pay

## 2024-04-02 ENCOUNTER — Telehealth (HOSPITAL_COMMUNITY): Payer: Self-pay

## 2024-04-02 ENCOUNTER — Other Ambulatory Visit: Payer: Self-pay

## 2024-04-02 ENCOUNTER — Ambulatory Visit (INDEPENDENT_AMBULATORY_CARE_PROVIDER_SITE_OTHER): Admitting: Adult Health

## 2024-04-02 VITALS — BP 124/85 | HR 74 | Temp 98.2°F | Ht 67.5 in | Wt 220.0 lb

## 2024-04-02 DIAGNOSIS — F418 Other specified anxiety disorders: Secondary | ICD-10-CM

## 2024-04-02 DIAGNOSIS — R7303 Prediabetes: Secondary | ICD-10-CM | POA: Diagnosis not present

## 2024-04-02 DIAGNOSIS — E782 Mixed hyperlipidemia: Secondary | ICD-10-CM

## 2024-04-02 DIAGNOSIS — E669 Obesity, unspecified: Secondary | ICD-10-CM

## 2024-04-02 DIAGNOSIS — R632 Polyphagia: Secondary | ICD-10-CM | POA: Diagnosis not present

## 2024-04-02 DIAGNOSIS — E559 Vitamin D deficiency, unspecified: Secondary | ICD-10-CM | POA: Diagnosis not present

## 2024-04-02 DIAGNOSIS — Z6831 Body mass index (BMI) 31.0-31.9, adult: Secondary | ICD-10-CM

## 2024-04-02 DIAGNOSIS — Z6834 Body mass index (BMI) 34.0-34.9, adult: Secondary | ICD-10-CM

## 2024-04-02 MED ORDER — SEMAGLUTIDE-WEIGHT MANAGEMENT 1 MG/0.5ML ~~LOC~~ SOAJ
1.0000 mg | SUBCUTANEOUS | 0 refills | Status: DC
  Filled 2024-04-02 – 2024-04-04 (×2): qty 2, 28d supply, fill #0

## 2024-04-02 MED ORDER — VITAMIN D (ERGOCALCIFEROL) 1.25 MG (50000 UNIT) PO CAPS
50000.0000 [IU] | ORAL_CAPSULE | ORAL | 0 refills | Status: DC
  Filled 2024-04-02: qty 4, 28d supply, fill #0

## 2024-04-02 NOTE — Progress Notes (Signed)
 WEIGHT SUMMARY AND BIOMETRICS  Vitals Temp: 98.2 F (36.8 C) BP: 124/85 Pulse Rate: 74 SpO2: 96 %   Anthropometric Measurements Height: 5' 7.5 (1.715 m) Weight: 220 lb (99.8 kg) BMI (Calculated): 33.93 Weight at Last Visit: 212 lb Weight Lost Since Last Visit: 0 lb Weight Gained Since Last Visit: 2 lb Starting Weight: 254 Total Weight Loss (lbs): 34 lb (15.4 kg)   Body Composition  Body Fat %: 24.9 % Fat Mass (lbs): 55 lbs Muscle Mass (lbs): 157.6 lbs Total Body Water (lbs): 111.6 lbs Visceral Fat Rating : 12   Other Clinical Data Fasting: yes Labs: no Today's Visit #: 81 Starting Date: 04/09/19    Chief Complaint:   OBESITY Alejandro [name] is here to discuss his progress with his obesity treatment plan.  He is on the following a lower carbohydrate, vegetable and lean protein rich diet plan and states he is following his eating plan approximately 30 % of the time.  He states he is exercising Cardiovascular Exercise and Strength Training 60 minutes 5 times per week.  Interim History:  03/12/2024 restarted weekly Wegovy  0.5mg  He endorses breakthrough polyphagia Denies mass in neck, dysphagia, dyspepsia, persistent hoarseness, abdominal pain, or N/V/C   He has settled back into work and states its just great. His wife and children are stable, both boys very busy with travel baseball- season to end soon.  Subjective:   1. Vitamin D  deficiency  Latest Reference Range & Units 11/17/22 07:37 09/01/23 08:20 12/29/23 10:03  Vitamin D , 25-Hydroxy 30 - 100 ng/mL 40.0 33.1 48.16   He endorses stable energy levels, largely r/t increased duration and intensity of exercise  2. Polyphagia 03/12/2024 restarted weekly Wegovy  0.5mg  He endorses breakthrough polyphagia Denies mass in neck, dysphagia, dyspepsia, persistent hoarseness, abdominal pain, or N/V/C   3. Prediabetes Lab Results  Component Value Date   HGBA1C 4.9 12/29/2023   HGBA1C 5.4 09/01/2023   HGBA1C  5.8 (H) 11/17/2022    Alejandro [name] has increased duration and intensity of exercise He reports that this has greatly improved her overall mental health and physical well being  4. Mixed hyperlipidemia Lipid Panel     Component Value Date/Time   CHOL 215 (H) 12/29/2023 1003   CHOL 260 (H) 09/01/2023 0820   TRIG 75 12/29/2023 1003   HDL 56 12/29/2023 1003   HDL 55 09/01/2023 0820   CHOLHDL 3.8 12/29/2023 1003   VLDL 15 12/29/2023 1003   LDLCALC 144 (H) 12/29/2023 1003   LDLCALC 162 (H) 09/01/2023 0820   LDLDIRECT 224.0 08/26/2017 1449   LABVLDL 43 (H) 09/01/2023 0820    He denies tobacco/vape use He has been increasing duration and intensity of exercise- denies CP  5. Depression with anxiety He endorses stable mood, denies SI/HI He continues close f/u by mental health care team  Assessment/Plan:   1. Vitamin D  deficiency Refill - Vitamin D , Ergocalciferol , (DRISDOL ) 1.25 MG (50000 UNIT) CAPS capsule; Take 1 capsule (50,000 Units total) by mouth every 7 (seven) days.  Dispense: 5 capsule; Refill: 0  2. Polyphagia Continue high intensity exercise Increase MP compliance to at least 80%  Refill and INCREASE  semaglutide -weight management (WEGOVY ) 1 MG/0.5ML SOAJ SQ injection Inject 1 mg into the skin once a week. Dispense: 2 mL, Refills: 0 of 0 remaining   3. Prediabetes (Primary) Continue high intensity exercise Increase MP compliance to at least 80% Refill and INCREASE  semaglutide -weight management (WEGOVY ) 1 MG/0.5ML SOAJ SQ injection Inject 1 mg into the  skin once a week. Dispense: 2 mL, Refills: 0 of 0 remaining   4. Mixed hyperlipidemia Continue high intensity exercise Increase MP compliance to at least 80%  5. Depression with anxiety Continue high intensity exercise Increase MP compliance to at least 80% Continue close f/u with mental health care team  6. BMI 31.0-31.9,adult, CURRENT BMI 34.1 Refill and INCREASE  semaglutide -weight management (WEGOVY ) 1  MG/0.5ML SOAJ SQ injection Inject 1 mg into the skin once a week. Dispense: 2 mL, Refills: 0 of 0 remaining    Alejandro [name] is not currently in the action stage of change. As such, his goal is to get back to weightloss efforts . He has agreed to following a lower carbohydrate, vegetable and lean protein rich diet plan.   Exercise goals: For substantial health benefits, adults should do at least 150 minutes (2 hours and 30 minutes) a week of moderate-intensity, or 75 minutes (1 hour and 15 minutes) a week of vigorous-intensity aerobic physical activity, or an equivalent combination of moderate- and vigorous-intensity aerobic activity. Aerobic activity should be performed in episodes of at least 10 minutes, and preferably, it should be spread throughout the week.  Behavioral modification strategies: increasing lean protein intake, decreasing simple carbohydrates, increasing vegetables, increasing water intake, meal planning and cooking strategies, keeping healthy foods in the home, ways to avoid boredom eating, and planning for success.  Alejandro [name] has agreed to follow-up with our clinic in 4 weeks. He was informed of the importance of frequent follow-up visits to maximize his success with intensive lifestyle modifications for his multiple health conditions.   Objective:   Blood pressure 124/85, pulse 74, temperature 98.2 F (36.8 C), height 5' 7.5 (1.715 m), weight 220 lb (99.8 kg), SpO2 96%. Body mass index is 33.95 kg/m.  General: Cooperative, alert, well developed, in no acute distress. HEENT: Conjunctivae and lids unremarkable. Cardiovascular: Regular rhythm.  Lungs: Normal work of breathing. Neurologic: No focal deficits.   Lab Results  Component Value Date   CREATININE 0.80 12/29/2023   BUN 15 12/29/2023   NA 140 12/29/2023   K 4.3 12/29/2023   CL 104 12/29/2023   CO2 25 12/29/2023   Lab Results  Component Value Date   ALT 24 12/29/2023   AST 20 12/29/2023   ALKPHOS 49 12/29/2023    BILITOT 0.8 12/29/2023   Lab Results  Component Value Date   HGBA1C 4.9 12/29/2023   HGBA1C 5.4 09/01/2023   HGBA1C 5.8 (H) 11/17/2022   HGBA1C 5.7 (H) 04/21/2022   HGBA1C 5.2 07/07/2021   Lab Results  Component Value Date   INSULIN  21.5 09/01/2023   INSULIN  30.3 (H) 11/17/2022   INSULIN  14.6 04/21/2022   INSULIN  25.5 (H) 02/03/2021   INSULIN  14.2 06/04/2020   Lab Results  Component Value Date   TSH 1.563 12/29/2023   Lab Results  Component Value Date   CHOL 215 (H) 12/29/2023   HDL 56 12/29/2023   LDLCALC 144 (H) 12/29/2023   LDLDIRECT 224.0 08/26/2017   TRIG 75 12/29/2023   CHOLHDL 3.8 12/29/2023   Lab Results  Component Value Date   VD25OH 48.16 12/29/2023   VD25OH 33.1 09/01/2023   VD25OH 40.0 11/17/2022   Lab Results  Component Value Date   WBC 5.6 12/29/2023   HGB 16.2 12/29/2023   HCT 46.8 12/29/2023   MCV 86.2 12/29/2023   PLT 430 (H) 12/29/2023   No results found for: IRON, TIBC, FERRITIN  Attestation Statements:   Reviewed by clinician on day of visit: allergies,  medications, problem list, medical history, surgical history, family history, social history, and previous encounter notes.  I have reviewed the above documentation for accuracy and completeness, and I agree with the above. -  Alejandro [name] d. Anokhi Shannon, NP-C

## 2024-04-03 ENCOUNTER — Telehealth (HOSPITAL_COMMUNITY): Payer: Self-pay

## 2024-04-03 ENCOUNTER — Other Ambulatory Visit (HOSPITAL_COMMUNITY): Payer: Self-pay

## 2024-04-03 NOTE — Telephone Encounter (Signed)
 PA request has been Received. New Encounter has been or will be created for follow up. For additional info see Pharmacy Prior Auth telephone encounter from 04/03/24.

## 2024-04-03 NOTE — Telephone Encounter (Signed)
 Pharmacy Patient Advocate Encounter   Received notification from Pt Calls Messages that prior authorization for Wegovy  1MG /0.5ML auto-injectors  is required/requested.   Insurance verification completed.   The patient is insured through [facility].   Per test claim: PA required; PA submitted to above mentioned insurance via Latent Key/confirmation #/EOC [id number] Status is pending

## 2024-04-04 ENCOUNTER — Other Ambulatory Visit (HOSPITAL_COMMUNITY): Payer: Self-pay

## 2024-04-05 ENCOUNTER — Other Ambulatory Visit (HOSPITAL_COMMUNITY): Payer: Self-pay

## 2024-04-05 NOTE — Telephone Encounter (Signed)
 Pharmacy Patient Advocate Encounter  Received notification from [facility] that Prior Authorization for  Wegovy  1MG /0.5ML auto-injectors  has been APPROVED from 04/04/24 to 04/04/25. Ran test claim, Copay is $0. This test claim was processed through [facility]- copay amounts may vary at other pharmacies due to pharmacy/plan contracts, or as the patient moves through the different stages of their insurance plan.   PA #/Case ID/Reference #: [id number]

## 2024-04-17 ENCOUNTER — Other Ambulatory Visit (HOSPITAL_COMMUNITY): Payer: Self-pay

## 2024-04-30 ENCOUNTER — Other Ambulatory Visit (HOSPITAL_COMMUNITY): Payer: Self-pay

## 2024-04-30 ENCOUNTER — Encounter (INDEPENDENT_AMBULATORY_CARE_PROVIDER_SITE_OTHER): Payer: Self-pay | Admitting: Adult Health

## 2024-04-30 ENCOUNTER — Ambulatory Visit (INDEPENDENT_AMBULATORY_CARE_PROVIDER_SITE_OTHER): Payer: Self-pay | Admitting: Adult Health

## 2024-04-30 VITALS — BP 104/71 | HR 91 | Temp 98.5°F | Ht 67.5 in | Wt 221.0 lb

## 2024-04-30 DIAGNOSIS — Z6834 Body mass index (BMI) 34.0-34.9, adult: Secondary | ICD-10-CM

## 2024-04-30 DIAGNOSIS — E782 Mixed hyperlipidemia: Secondary | ICD-10-CM

## 2024-04-30 DIAGNOSIS — E559 Vitamin D deficiency, unspecified: Secondary | ICD-10-CM | POA: Diagnosis not present

## 2024-04-30 DIAGNOSIS — E669 Obesity, unspecified: Secondary | ICD-10-CM

## 2024-04-30 DIAGNOSIS — R632 Polyphagia: Secondary | ICD-10-CM | POA: Diagnosis not present

## 2024-04-30 DIAGNOSIS — Z6831 Body mass index (BMI) 31.0-31.9, adult: Secondary | ICD-10-CM

## 2024-04-30 DIAGNOSIS — F418 Other specified anxiety disorders: Secondary | ICD-10-CM | POA: Diagnosis not present

## 2024-04-30 MED ORDER — WEGOVY 1.7 MG/0.75ML ~~LOC~~ SOAJ
1.7000 mg | SUBCUTANEOUS | 0 refills | Status: DC
  Filled 2024-04-30 – 2024-05-22 (×2): qty 3, 28d supply, fill #0

## 2024-04-30 MED ORDER — VITAMIN D (ERGOCALCIFEROL) 1.25 MG (50000 UNIT) PO CAPS
50000.0000 [IU] | ORAL_CAPSULE | ORAL | 0 refills | Status: DC
  Filled 2024-04-30 – 2024-06-18 (×2): qty 4, 28d supply, fill #0

## 2024-04-30 NOTE — Progress Notes (Unsigned)
 WEIGHT SUMMARY AND BIOMETRICS  Vitals Temp: 98.5 F (36.9 C) BP: 104/71 Pulse Rate: 91 SpO2: 99 %   Anthropometric Measurements Height: 5' 7.5 (1.715 m) Weight: 221 lb (100.2 kg) BMI (Calculated): 34.08 Weight at Last Visit: 220lb Weight Lost Since Last Visit: 0lb Weight Gained Since Last Visit: 1lb Starting Weight: 254lb Total Weight Loss (lbs): 33 lb (15 kg)   Body Composition  Body Fat %: 24.9 % Fat Mass (lbs): 55 lbs Muscle Mass (lbs): 157.8 lbs Total Body Water (lbs): 113 lbs Visceral Fat Rating : 12   Other Clinical Data Fasting: No Labs: No Today's Visit #: 14 Starting Date: 04/09/19    Chief Complaint:   OBESITY Alejandro [name] is here to discuss his progress with his obesity treatment plan.  He is on the following a lower carbohydrate, vegetable and lean protein rich diet plan and states he is following his eating plan approximately 20 % of the time.  He states he is exercising Yoga/Walking 60 minutes 2/2 times per week.   Interim History:   03/12/2024 restarted weekly Wegovy  0.5mg  04/02/2024 Wegovy  0.5mg  increased to 1mg  Wegovy  1MG /0.5ML auto-injectors  has been APPROVED from 04/04/24 to 04/04/25  Patient was counseled on the importance of maintaining healthy lifestyle habits, including balanced nutrition, regular physical activity, and behavioral modifications, while taking antiobesity medication.   Patient verbalized understanding that medication is an adjunct to, not a replacement for, lifestyle changes and that the long-term success and weight maintenance depend on continued adherence to these strategies.  He reports much improved work environment and significantly reduced overall levels of stress/anxiety.  He recently travelled to [place]  for 8 days- work trip. He was subject to the meals provided by his company  He predicts that he will be home the remainder of the calendar year  Subjective:   1. Mixed hyperlipidemia Lipid Panel      Component Value Date/Time   CHOL 215 (H) 12/29/2023 1003   CHOL 260 (H) 09/01/2023 0820   TRIG 75 12/29/2023 1003   HDL 56 12/29/2023 1003   HDL 55 09/01/2023 0820   CHOLHDL 3.8 12/29/2023 1003   VLDL 15 12/29/2023 1003   LDLCALC 144 (H) 12/29/2023 1003   LDLCALC 162 (H) 09/01/2023 0820   LDLDIRECT 224.0 08/26/2017 1449   LABVLDL 43 (H) 09/01/2023 0820    He has been exercising regularly He denies CP with exertion He denies tobacco/vape use  2. Polyphagia He is on weekly Wegovy  1mg  He states  I haven't noticed much on this strength. He was previously on a dose Zepbound  15mg  and felt that the GIP/GLP-1 was more effective and he tolerated well. Denies mass in neck, dysphagia, dyspepsia, persistent hoarseness, abdominal pain, or N/V/C  He is agreeable to increasing Wegovy  1mg  to 1.7mg   3. Vitamin D  deficiency He endorses stable energy levels  4. Depression with anxiety He is on daily Lexapro  10mg  and PRN BID Vistaril  25mg  He reports stable mood and denies SI/HI His son's are doing well in school and with travel sports   Assessment/Plan:   1. Mixed hyperlipidemia (Primary) Continue healthy eating and regular exercise Continue weekly Wegovy   2. Polyphagia Patient was counseled on the importance of maintaining healthy lifestyle habits, including balanced nutrition, regular physical activity, and behavioral modifications, while taking antiobesity medication.   Patient verbalized understanding that medication is an adjunct to, not a replacement for, lifestyle changes and that the long-term success and weight maintenance depend on continued adherence to these strategies.  3.  Vitamin D  deficiency Refill Vitamin D , Ergocalciferol , (DRISDOL ) 1.25 MG (50000 UNIT) CAPS capsule Take 1 capsule (50,000 Units total) by mouth every 7 (seven) days. Dispense: 5 capsule, Refills: 0 of 0 remaining   4. Depression with anxiety Continue healthy eating and regular exercise  5. BMI  31.0-31.9,adult, CURRENT BMI 34.1 Refill and INCREASE semaglutide -weight management (WEGOVY ) 1.7 MG/0.75ML SOAJ SQ injection Inject 1.7 mg into the skin once a week. Dispense: 3 mL, Refills: 0 of 0 remaining   Alejandro [name] is not currently in the action stage of change. As such, his goal is to get back to weightloss efforts . He has agreed to the Category 4 Plan.   Exercise goals: For substantial health benefits, adults should do at least 150 minutes (2 hours and 30 minutes) a week of moderate-intensity, or 75 minutes (1 hour and 15 minutes) a week of vigorous-intensity aerobic physical activity, or an equivalent combination of moderate- and vigorous-intensity aerobic activity. Aerobic activity should be performed in episodes of at least 10 minutes, and preferably, it should be spread throughout the week.  Behavioral modification strategies: increasing lean protein intake, decreasing simple carbohydrates, increasing vegetables, increasing water intake, no skipping meals, meal planning and cooking strategies, keeping healthy foods in the home, and planning for success.  Alejandro [name] has agreed to follow-up with our clinic in 4 weeks. He was informed of the importance of frequent follow-up visits to maximize his success with intensive lifestyle modifications for his multiple health conditions.   Objective:   Blood pressure 104/71, pulse 91, temperature 98.5 F (36.9 C), height 5' 7.5 (1.715 m), weight 221 lb (100.2 kg), SpO2 99%. Body mass index is 34.1 kg/m.  General: Cooperative, alert, well developed, in no acute distress. HEENT: Conjunctivae and lids unremarkable. Cardiovascular: Regular rhythm.  Lungs: Normal work of breathing. Neurologic: No focal deficits.   Lab Results  Component Value Date   CREATININE 0.80 12/29/2023   BUN 15 12/29/2023   NA 140 12/29/2023   K 4.3 12/29/2023   CL 104 12/29/2023   CO2 25 12/29/2023   Lab Results  Component Value Date   ALT 24 12/29/2023   AST 20  12/29/2023   ALKPHOS 49 12/29/2023   BILITOT 0.8 12/29/2023   Lab Results  Component Value Date   HGBA1C 4.9 12/29/2023   HGBA1C 5.4 09/01/2023   HGBA1C 5.8 (H) 11/17/2022   HGBA1C 5.7 (H) 04/21/2022   HGBA1C 5.2 07/07/2021   Lab Results  Component Value Date   INSULIN  21.5 09/01/2023   INSULIN  30.3 (H) 11/17/2022   INSULIN  14.6 04/21/2022   INSULIN  25.5 (H) 02/03/2021   INSULIN  14.2 06/04/2020   Lab Results  Component Value Date   TSH 1.563 12/29/2023   Lab Results  Component Value Date   CHOL 215 (H) 12/29/2023   HDL 56 12/29/2023   LDLCALC 144 (H) 12/29/2023   LDLDIRECT 224.0 08/26/2017   TRIG 75 12/29/2023   CHOLHDL 3.8 12/29/2023   Lab Results  Component Value Date   VD25OH 48.16 12/29/2023   VD25OH 33.1 09/01/2023   VD25OH 40.0 11/17/2022   Lab Results  Component Value Date   WBC 5.6 12/29/2023   HGB 16.2 12/29/2023   HCT 46.8 12/29/2023   MCV 86.2 12/29/2023   PLT 430 (H) 12/29/2023   No results found for: IRON, TIBC, FERRITIN  Attestation Statements:   Reviewed by clinician on day of visit: allergies, medications, problem list, medical history, surgical history, family history, social history, and previous encounter notes.  I  have reviewed the above documentation for accuracy and completeness, and I agree with the above. -  Misaki Sozio d. Rodina Pinales, NP-C

## 2024-05-09 ENCOUNTER — Other Ambulatory Visit (HOSPITAL_COMMUNITY): Payer: Self-pay

## 2024-05-22 ENCOUNTER — Other Ambulatory Visit: Payer: Self-pay

## 2024-05-22 ENCOUNTER — Other Ambulatory Visit (HOSPITAL_COMMUNITY): Payer: Self-pay

## 2024-05-28 ENCOUNTER — Ambulatory Visit (INDEPENDENT_AMBULATORY_CARE_PROVIDER_SITE_OTHER): Admitting: Adult Health

## 2024-06-18 ENCOUNTER — Encounter (HOSPITAL_COMMUNITY): Payer: Self-pay

## 2024-06-18 ENCOUNTER — Other Ambulatory Visit (INDEPENDENT_AMBULATORY_CARE_PROVIDER_SITE_OTHER): Payer: Self-pay | Admitting: Adult Health

## 2024-06-18 ENCOUNTER — Other Ambulatory Visit (HOSPITAL_COMMUNITY): Payer: Self-pay

## 2024-06-18 ENCOUNTER — Other Ambulatory Visit: Payer: Self-pay

## 2024-06-18 DIAGNOSIS — E782 Mixed hyperlipidemia: Secondary | ICD-10-CM

## 2024-06-19 ENCOUNTER — Other Ambulatory Visit (HOSPITAL_COMMUNITY): Payer: Self-pay

## 2024-06-19 MED ORDER — TRAZODONE HCL 50 MG PO TABS
50.0000 mg | ORAL_TABLET | Freq: Every evening | ORAL | 0 refills | Status: AC | PRN
  Filled 2024-06-19 – 2024-06-20 (×2): qty 30, 30d supply, fill #0

## 2024-06-20 ENCOUNTER — Encounter (HOSPITAL_COMMUNITY): Payer: Self-pay

## 2024-06-20 ENCOUNTER — Other Ambulatory Visit (HOSPITAL_COMMUNITY): Payer: Self-pay

## 2024-07-02 ENCOUNTER — Encounter (INDEPENDENT_AMBULATORY_CARE_PROVIDER_SITE_OTHER): Payer: Self-pay | Admitting: Adult Health

## 2024-07-02 ENCOUNTER — Other Ambulatory Visit (HOSPITAL_COMMUNITY): Payer: Self-pay

## 2024-07-02 ENCOUNTER — Ambulatory Visit (INDEPENDENT_AMBULATORY_CARE_PROVIDER_SITE_OTHER): Admitting: Adult Health

## 2024-07-02 VITALS — BP 118/74 | HR 85 | Temp 98.2°F | Ht 67.5 in | Wt 228.0 lb

## 2024-07-02 DIAGNOSIS — E559 Vitamin D deficiency, unspecified: Secondary | ICD-10-CM

## 2024-07-02 DIAGNOSIS — R7303 Prediabetes: Secondary | ICD-10-CM | POA: Diagnosis not present

## 2024-07-02 DIAGNOSIS — R632 Polyphagia: Secondary | ICD-10-CM

## 2024-07-02 DIAGNOSIS — E782 Mixed hyperlipidemia: Secondary | ICD-10-CM

## 2024-07-02 DIAGNOSIS — E669 Obesity, unspecified: Secondary | ICD-10-CM

## 2024-07-02 DIAGNOSIS — Z6835 Body mass index (BMI) 35.0-35.9, adult: Secondary | ICD-10-CM | POA: Diagnosis not present

## 2024-07-02 DIAGNOSIS — Z6831 Body mass index (BMI) 31.0-31.9, adult: Secondary | ICD-10-CM

## 2024-07-02 MED ORDER — WEGOVY 2.4 MG/0.75ML ~~LOC~~ SOAJ
2.4000 mg | SUBCUTANEOUS | 0 refills | Status: AC
  Filled 2024-07-02 – 2024-07-17 (×2): qty 3, 28d supply, fill #0

## 2024-07-02 MED ORDER — VITAMIN D (ERGOCALCIFEROL) 1.25 MG (50000 UNIT) PO CAPS
50000.0000 [IU] | ORAL_CAPSULE | ORAL | 0 refills | Status: AC
  Filled 2024-07-02: qty 5, 35d supply, fill #0

## 2024-07-02 NOTE — Progress Notes (Signed)
 "    WEIGHT SUMMARY AND BIOMETRICS  Vitals Temp: 98.2 F (36.8 C) BP: 118/74 Pulse Rate: 85 SpO2: 97 %   Anthropometric Measurements Height: 5' 7.5 (1.715 m) Weight: 228 lb (103.4 kg) BMI (Calculated): 35.16 Weight at Last Visit: 221 lb Weight Lost Since Last Visit: 0 Weight Gained Since Last Visit: 7 lb Starting Weight: 254 lb Total Weight Loss (lbs): 26 lb (11.8 kg) Peak Weight: 266 lb   Body Composition  Body Fat %: 26.3 % Fat Mass (lbs): 60 lbs Muscle Mass (lbs): 160.2 lbs Total Body Water (lbs): 112.6 lbs Visceral Fat Rating : 14   Other Clinical Data Fasting: No Labs: No Today's Visit #: 16 Starting Date: 04/09/19    Chief Complaint:   OBESITY Alejandro [name] is here to discuss his progress with his obesity treatment plan.  He is on the following a lower carbohydrate, vegetable and lean protein rich diet plan and states he is following his eating plan approximately 50 % of the time.  He states he is exercising Yoga 60 minutes 3 times per week.  Interim History:  Wegovy  1MG /0.5ML auto-injectors  has been APPROVED from 04/04/24 to 04/04/25  03/12/2024 restarted weekly Wegovy  0.5mg  04/02/2024 Wegovy  0.5mg  increased to 1mg   04/30/2024 Wegovy  1 mg increased to 1.7mg   He endorses breakthrough polyphagia the last several weeks.  He endorses stable home life and work environment.  Reviewed Bioimpedance Results with pt: Muscle Mass: +1.6 lbs Adipose Mass: +2.2 lbs  Subjective:   1. Prediabetes Lab Results  Component Value Date   HGBA1C 4.9 12/29/2023   HGBA1C 5.4 09/01/2023   HGBA1C 5.8 (H) 11/17/2022    He is currently on weekly Wegovy  1.7mg  Denies mass in neck, dysphagia, dyspepsia, persistent hoarseness, abdominal pain, or N/V/C  He endorses breakthrough polyphagia the last several weeks. He is agreeable to increased Wegovy  dose  2. Mixed hyperlipidemia He is on daily Crestor  40mg  He denies myalgias He continues to practice yoga vigorously at  least 3 times per week.  3. Polyphagia 03/12/2024 restarted weekly Wegovy  0.5mg  04/02/2024 Wegovy  0.5mg  increased to 1mg  04/30/2024 Wegovy  1 mg increased to 1.7mg  He endorses breakthrough polyphagia the last several weeks. He is agreeable to increased Wegovy  dose Denies mass in neck, dysphagia, dyspepsia, persistent hoarseness, abdominal pain, or N/V/C   4. Vitamin D  deficiency  Latest Reference Range & Units 11/17/22 07:37 09/01/23 08:20 12/29/23 10:03  Vitamin D , 25-Hydroxy 30 - 100 ng/mL 40.0 33.1 48.16   Vit D level stable He endorses stable energy levels  Assessment/Plan:   1. Prediabetes Increase plan compliance on Low CHO MP Continue regular exercise Refill and INCREASE  semaglutide -weight management (WEGOVY ) 2.4 MG/0.75ML SOAJ SQ injection Inject 2.4 mg into the skin once a week. Dispense: 3 mL, Refills: 0 of 0 remaining   2. Mixed hyperlipidemia (Primary) Increase plan compliance on Low CHO MP Continue regular exercise  3. Polyphagia Increase plan compliance on Low CHO MP Continue regular exercise Refill and INCREASE  semaglutide -weight management (WEGOVY ) 2.4 MG/0.75ML SOAJ SQ injection Inject 2.4 mg into the skin once a week. Dispense: 3 mL, Refills: 0 of 0 remaining   4. Vitamin D  deficiency Refill Vitamin D , Ergocalciferol , (DRISDOL ) 1.25 MG (50000 UNIT) CAPS capsule Take 1 capsule (50,000 Units total) by mouth every 7 (seven) days. Dispense: 5 capsule, Refills: 0 of 0 remaining   5. BMI 31.0-31.9,adult, CURRENT BMI 35.2 Refill and INCREASE  semaglutide -weight management (WEGOVY ) 2.4 MG/0.75ML SOAJ SQ injection Inject 2.4 mg into the skin once a  week. Dispense: 3 mL, Refills: 0 of 0 remaining   Patient was counseled on the importance of maintaining healthy lifestyle habits, including balanced nutrition, regular physical activity, and behavioral modifications, while taking antiobesity medication.   Patient verbalized understanding that medication is an  adjunct to, not a replacement for, lifestyle changes and that the long-term success and weight maintenance depend on continued adherence to these strategies.   Alejandro [name] is not currently in the action stage of change. As such, his goal is to get back to weightloss efforts . He has agreed to following a lower carbohydrate, vegetable and lean protein rich diet plan.   Exercise goals: For substantial health benefits, adults should do at least 150 minutes (2 hours and 30 minutes) a week of moderate-intensity, or 75 minutes (1 hour and 15 minutes) a week of vigorous-intensity aerobic physical activity, or an equivalent combination of moderate- and vigorous-intensity aerobic activity. Aerobic activity should be performed in episodes of at least 10 minutes, and preferably, it should be spread throughout the week.  Behavioral modification strategies: increasing lean protein intake, decreasing simple carbohydrates, increasing vegetables, increasing water intake, meal planning and cooking strategies, keeping healthy foods in the home, ways to avoid boredom eating, and planning for success.  Alejandro [name] has agreed to follow-up with our clinic in 4 weeks. He was informed of the importance of frequent follow-up visits to maximize his success with intensive lifestyle modifications for his multiple health conditions.   Check Fasting Labs at next OV  Objective:   Blood pressure 118/74, pulse 85, temperature 98.2 F (36.8 C), height 5' 7.5 (1.715 m), weight 228 lb (103.4 kg), SpO2 97%. Body mass index is 35.18 kg/m.  General: Cooperative, alert, well developed, in no acute distress. HEENT: Conjunctivae and lids unremarkable. Cardiovascular: Regular rhythm.  Lungs: Normal work of breathing. Neurologic: No focal deficits.   Lab Results  Component Value Date   CREATININE 0.80 12/29/2023   BUN 15 12/29/2023   NA 140 12/29/2023   K 4.3 12/29/2023   CL 104 12/29/2023   CO2 25 12/29/2023   Lab Results   Component Value Date   ALT 24 12/29/2023   AST 20 12/29/2023   ALKPHOS 49 12/29/2023   BILITOT 0.8 12/29/2023   Lab Results  Component Value Date   HGBA1C 4.9 12/29/2023   HGBA1C 5.4 09/01/2023   HGBA1C 5.8 (H) 11/17/2022   HGBA1C 5.7 (H) 04/21/2022   HGBA1C 5.2 07/07/2021   Lab Results  Component Value Date   INSULIN  21.5 09/01/2023   INSULIN  30.3 (H) 11/17/2022   INSULIN  14.6 04/21/2022   INSULIN  25.5 (H) 02/03/2021   INSULIN  14.2 06/04/2020   Lab Results  Component Value Date   TSH 1.563 12/29/2023   Lab Results  Component Value Date   CHOL 215 (H) 12/29/2023   HDL 56 12/29/2023   LDLCALC 144 (H) 12/29/2023   LDLDIRECT 224.0 08/26/2017   TRIG 75 12/29/2023   CHOLHDL 3.8 12/29/2023   Lab Results  Component Value Date   VD25OH 48.16 12/29/2023   VD25OH 33.1 09/01/2023   VD25OH 40.0 11/17/2022   Lab Results  Component Value Date   WBC 5.6 12/29/2023   HGB 16.2 12/29/2023   HCT 46.8 12/29/2023   MCV 86.2 12/29/2023   PLT 430 (H) 12/29/2023   No results found for: IRON, TIBC, FERRITIN  Attestation Statements:   Reviewed by clinician on day of visit: allergies, medications, problem list, medical history, surgical history, family history, social history, and previous encounter notes.  I have reviewed the above documentation for accuracy and completeness, and I agree with the above. -  Regis Wiland d. Jennaya Pogue, NP-C "

## 2024-07-04 ENCOUNTER — Other Ambulatory Visit (HOSPITAL_COMMUNITY): Payer: Self-pay

## 2024-07-04 MED ORDER — ESCITALOPRAM OXALATE 20 MG PO TABS: 20.0000 mg | ORAL_TABLET | Freq: Every day | ORAL | 0 refills | Status: AC

## 2024-07-04 MED ORDER — TRAZODONE HCL 50 MG PO TABS
50.0000 mg | ORAL_TABLET | Freq: Every evening | ORAL | 0 refills | Status: AC | PRN
  Filled 2024-07-17: qty 90, 90d supply, fill #0

## 2024-07-12 ENCOUNTER — Other Ambulatory Visit (HOSPITAL_COMMUNITY): Payer: Self-pay

## 2024-07-17 ENCOUNTER — Other Ambulatory Visit (HOSPITAL_COMMUNITY): Payer: Self-pay

## 2024-07-17 ENCOUNTER — Encounter (INDEPENDENT_AMBULATORY_CARE_PROVIDER_SITE_OTHER): Payer: Self-pay | Admitting: Adult Health

## 2024-08-01 ENCOUNTER — Ambulatory Visit (INDEPENDENT_AMBULATORY_CARE_PROVIDER_SITE_OTHER): Admitting: Adult Health
